# Patient Record
Sex: Female | Born: 1960 | Race: White | Hispanic: No | Smoking: Never smoker
Health system: Southern US, Community
[De-identification: ages and names within clinical notes are randomized; demographics above are authoritative.]

## PROBLEM LIST (undated history)

## (undated) DIAGNOSIS — G43909 Migraine, unspecified, not intractable, without status migrainosus: Secondary | ICD-10-CM

## (undated) DIAGNOSIS — Z8639 Personal history of other endocrine, nutritional and metabolic disease: Secondary | ICD-10-CM

## (undated) DIAGNOSIS — K8020 Calculus of gallbladder without cholecystitis without obstruction: Secondary | ICD-10-CM

## (undated) DIAGNOSIS — F32A Depression, unspecified: Secondary | ICD-10-CM

## (undated) DIAGNOSIS — N63 Unspecified lump in unspecified breast: Secondary | ICD-10-CM

## (undated) DIAGNOSIS — Z87442 Personal history of urinary calculi: Secondary | ICD-10-CM

## (undated) DIAGNOSIS — G4730 Sleep apnea, unspecified: Secondary | ICD-10-CM

## (undated) DIAGNOSIS — E079 Disorder of thyroid, unspecified: Secondary | ICD-10-CM

## (undated) DIAGNOSIS — E039 Hypothyroidism, unspecified: Secondary | ICD-10-CM

## (undated) DIAGNOSIS — I10 Essential (primary) hypertension: Secondary | ICD-10-CM

## (undated) DIAGNOSIS — F329 Major depressive disorder, single episode, unspecified: Secondary | ICD-10-CM

## (undated) DIAGNOSIS — E119 Type 2 diabetes mellitus without complications: Secondary | ICD-10-CM

## (undated) DIAGNOSIS — T7840XA Allergy, unspecified, initial encounter: Secondary | ICD-10-CM

## (undated) HISTORY — DX: Sleep apnea, unspecified: G47.30

## (undated) HISTORY — DX: Depression, unspecified: F32.A

## (undated) HISTORY — DX: Major depressive disorder, single episode, unspecified: F32.9

## (undated) HISTORY — DX: Essential (primary) hypertension: I10

## (undated) HISTORY — DX: Disorder of thyroid, unspecified: E07.9

## (undated) HISTORY — DX: Personal history of other endocrine, nutritional and metabolic disease: Z86.39

## (undated) HISTORY — DX: Calculus of gallbladder without cholecystitis without obstruction: K80.20

## (undated) HISTORY — DX: Unspecified lump in unspecified breast: N63.0

## (undated) HISTORY — DX: Allergy, unspecified, initial encounter: T78.40XA

## (undated) HISTORY — DX: Migraine, unspecified, not intractable, without status migrainosus: G43.909

---

## 1999-03-31 ENCOUNTER — Other Ambulatory Visit: Admission: RE | Admit: 1999-03-31 | Discharge: 1999-03-31 | Payer: Self-pay | Admitting: Obstetrics and Gynecology

## 2000-03-17 ENCOUNTER — Other Ambulatory Visit: Admission: RE | Admit: 2000-03-17 | Discharge: 2000-03-17 | Payer: Self-pay | Admitting: Obstetrics and Gynecology

## 2001-03-19 ENCOUNTER — Other Ambulatory Visit: Admission: RE | Admit: 2001-03-19 | Discharge: 2001-03-19 | Payer: Self-pay | Admitting: Obstetrics and Gynecology

## 2001-06-26 ENCOUNTER — Encounter: Payer: Self-pay | Admitting: Obstetrics and Gynecology

## 2001-06-26 ENCOUNTER — Ambulatory Visit (HOSPITAL_COMMUNITY): Admission: RE | Admit: 2001-06-26 | Discharge: 2001-06-26 | Payer: Self-pay | Admitting: Otolaryngology

## 2002-04-02 ENCOUNTER — Other Ambulatory Visit: Admission: RE | Admit: 2002-04-02 | Discharge: 2002-04-02 | Payer: Self-pay | Admitting: Obstetrics and Gynecology

## 2002-06-28 ENCOUNTER — Ambulatory Visit (HOSPITAL_COMMUNITY): Admission: RE | Admit: 2002-06-28 | Discharge: 2002-06-28 | Payer: Self-pay | Admitting: Family Medicine

## 2002-06-28 ENCOUNTER — Encounter: Payer: Self-pay | Admitting: Family Medicine

## 2002-07-02 ENCOUNTER — Encounter: Payer: Self-pay | Admitting: Obstetrics and Gynecology

## 2002-07-02 ENCOUNTER — Ambulatory Visit (HOSPITAL_COMMUNITY): Admission: RE | Admit: 2002-07-02 | Discharge: 2002-07-02 | Payer: Self-pay | Admitting: Obstetrics and Gynecology

## 2003-06-16 ENCOUNTER — Other Ambulatory Visit: Admission: RE | Admit: 2003-06-16 | Discharge: 2003-06-16 | Payer: Self-pay | Admitting: Obstetrics and Gynecology

## 2003-07-31 ENCOUNTER — Ambulatory Visit (HOSPITAL_COMMUNITY): Admission: RE | Admit: 2003-07-31 | Discharge: 2003-07-31 | Payer: Self-pay | Admitting: Obstetrics and Gynecology

## 2004-08-17 ENCOUNTER — Encounter: Admission: RE | Admit: 2004-08-17 | Discharge: 2004-08-17 | Payer: Self-pay | Admitting: Internal Medicine

## 2004-08-23 ENCOUNTER — Other Ambulatory Visit: Admission: RE | Admit: 2004-08-23 | Discharge: 2004-08-23 | Payer: Self-pay | Admitting: Obstetrics and Gynecology

## 2005-05-27 ENCOUNTER — Ambulatory Visit (HOSPITAL_COMMUNITY): Admission: RE | Admit: 2005-05-27 | Discharge: 2005-05-27 | Payer: Self-pay | Admitting: Family Medicine

## 2005-09-01 ENCOUNTER — Other Ambulatory Visit: Admission: RE | Admit: 2005-09-01 | Discharge: 2005-09-01 | Payer: Self-pay | Admitting: Obstetrics & Gynecology

## 2005-09-01 ENCOUNTER — Encounter: Admission: RE | Admit: 2005-09-01 | Discharge: 2005-09-01 | Payer: Self-pay | Admitting: Obstetrics and Gynecology

## 2005-09-06 ENCOUNTER — Encounter: Admission: RE | Admit: 2005-09-06 | Discharge: 2005-09-06 | Payer: Self-pay | Admitting: Obstetrics & Gynecology

## 2005-11-02 HISTORY — PX: VAGINAL HYSTERECTOMY: SUR661

## 2005-11-29 ENCOUNTER — Ambulatory Visit (HOSPITAL_COMMUNITY): Admission: RE | Admit: 2005-11-29 | Discharge: 2005-11-30 | Payer: Self-pay | Admitting: Obstetrics & Gynecology

## 2005-11-29 ENCOUNTER — Encounter (INDEPENDENT_AMBULATORY_CARE_PROVIDER_SITE_OTHER): Payer: Self-pay | Admitting: Specialist

## 2006-01-21 ENCOUNTER — Emergency Department (HOSPITAL_COMMUNITY): Admission: EM | Admit: 2006-01-21 | Discharge: 2006-01-21 | Payer: Self-pay | Admitting: Emergency Medicine

## 2006-03-29 ENCOUNTER — Emergency Department (HOSPITAL_COMMUNITY): Admission: EM | Admit: 2006-03-29 | Discharge: 2006-03-29 | Payer: Self-pay | Admitting: Family Medicine

## 2006-09-05 ENCOUNTER — Encounter: Admission: RE | Admit: 2006-09-05 | Discharge: 2006-09-05 | Payer: Self-pay | Admitting: Obstetrics & Gynecology

## 2007-09-06 ENCOUNTER — Encounter: Admission: RE | Admit: 2007-09-06 | Discharge: 2007-09-06 | Payer: Self-pay | Admitting: Obstetrics & Gynecology

## 2007-09-14 ENCOUNTER — Encounter: Admission: RE | Admit: 2007-09-14 | Discharge: 2007-09-14 | Payer: Self-pay | Admitting: Obstetrics & Gynecology

## 2007-12-14 ENCOUNTER — Other Ambulatory Visit: Admission: RE | Admit: 2007-12-14 | Discharge: 2007-12-14 | Payer: Self-pay | Admitting: Obstetrics and Gynecology

## 2008-03-17 ENCOUNTER — Encounter: Admission: RE | Admit: 2008-03-17 | Discharge: 2008-03-17 | Payer: Self-pay | Admitting: Obstetrics & Gynecology

## 2008-09-22 ENCOUNTER — Encounter: Admission: RE | Admit: 2008-09-22 | Discharge: 2008-09-22 | Payer: Self-pay | Admitting: Obstetrics & Gynecology

## 2008-11-10 ENCOUNTER — Ambulatory Visit: Payer: Self-pay | Admitting: Cardiology

## 2008-11-10 ENCOUNTER — Observation Stay (HOSPITAL_COMMUNITY): Admission: EM | Admit: 2008-11-10 | Discharge: 2008-11-11 | Payer: Self-pay | Admitting: Emergency Medicine

## 2009-09-29 ENCOUNTER — Encounter: Admission: RE | Admit: 2009-09-29 | Discharge: 2009-09-29 | Payer: Self-pay | Admitting: Obstetrics & Gynecology

## 2010-07-11 LAB — CBC
HCT: 38.2 % (ref 36.0–46.0)
Hemoglobin: 13.3 g/dL (ref 12.0–15.0)
MCHC: 34.7 g/dL (ref 30.0–36.0)
MCV: 95.7 fL (ref 78.0–100.0)
Platelets: 164 10*3/uL (ref 150–400)
RBC: 3.99 MIL/uL (ref 3.87–5.11)
RDW: 13.4 % (ref 11.5–15.5)
WBC: 9.3 10*3/uL (ref 4.0–10.5)

## 2010-07-11 LAB — HEMOGLOBIN A1C
Hgb A1c MFr Bld: 5.9 % (ref 4.6–6.1)
Mean Plasma Glucose: 123 mg/dL

## 2010-07-11 LAB — BASIC METABOLIC PANEL
BUN: 11 mg/dL (ref 6–23)
BUN: 18 mg/dL (ref 6–23)
CO2: 28 mEq/L (ref 19–32)
CO2: 30 mEq/L (ref 19–32)
Calcium: 8.9 mg/dL (ref 8.4–10.5)
Calcium: 9 mg/dL (ref 8.4–10.5)
Chloride: 106 mEq/L (ref 96–112)
Chloride: 109 mEq/L (ref 96–112)
Creatinine, Ser: 0.57 mg/dL (ref 0.4–1.2)
Creatinine, Ser: 0.61 mg/dL (ref 0.4–1.2)
GFR calc Af Amer: >60 mL/min (ref >60)
GFR calc Af Amer: >60 mL/min (ref >60)
GFR calc non Af Amer: >60 mL/min (ref >60)
GFR calc non Af Amer: >60 mL/min (ref >60)
Glucose, Bld: 106 mg/dL — ABNORMAL HIGH (ref 70–99)
Glucose, Bld: 119 mg/dL — ABNORMAL HIGH (ref 70–99)
Potassium: 3.6 mEq/L (ref 3.5–5.1)
Potassium: 4.1 mEq/L (ref 3.5–5.1)
Sodium: 137 mEq/L (ref 135–145)
Sodium: 141 mEq/L (ref 135–145)

## 2010-07-11 LAB — LIPID PANEL
Cholesterol: 137 mg/dL (ref 0–200)
HDL: 35 mg/dL — ABNORMAL LOW (ref >39)
LDL Cholesterol: 68 mg/dL (ref 0–99)
Total CHOL/HDL Ratio: 3.9 RATIO
Triglycerides: 168 mg/dL — ABNORMAL HIGH (ref <150)
VLDL: 34 mg/dL (ref 0–40)

## 2010-07-11 LAB — URINALYSIS, ROUTINE W REFLEX MICROSCOPIC
Bilirubin Urine: NEGATIVE
Glucose, UA: NEGATIVE mg/dL
Hgb urine dipstick: NEGATIVE
Ketones, ur: NEGATIVE mg/dL
Nitrite: NEGATIVE
Protein, ur: NEGATIVE mg/dL
Specific Gravity, Urine: 1.01 (ref 1.005–1.030)
Urobilinogen, UA: 0.2 mg/dL (ref 0.0–1.0)
pH: 5.5 (ref 5.0–8.0)

## 2010-07-11 LAB — POCT CARDIAC MARKERS
CKMB, poc: <1 ng/mL — ABNORMAL LOW (ref 1.0–8.0)
CKMB, poc: <1 ng/mL — ABNORMAL LOW (ref 1.0–8.0)
Myoglobin, poc: 25.7 ng/mL (ref 12–200)
Myoglobin, poc: 31.2 ng/mL (ref 12–200)
Troponin i, poc: <0.05 ng/mL (ref 0.00–0.09)
Troponin i, poc: <0.05 ng/mL (ref 0.00–0.09)

## 2010-07-11 LAB — D-DIMER, QUANTITATIVE (NOT AT ARMC): D-Dimer, Quant: 0.29 ug/mL-FEU (ref 0.00–0.48)

## 2010-07-11 LAB — DIFFERENTIAL
Basophils Absolute: 0 10*3/uL (ref 0.0–0.1)
Basophils Relative: 0 % (ref 0–1)
Eosinophils Absolute: 0.1 10*3/uL (ref 0.0–0.7)
Eosinophils Relative: 1 % (ref 0–5)
Lymphocytes Relative: 22 % (ref 12–46)
Lymphs Abs: 2.1 10*3/uL (ref 0.7–4.0)
Monocytes Absolute: 0.9 10*3/uL (ref 0.1–1.0)
Monocytes Relative: 9 % (ref 3–12)
Neutro Abs: 6.2 10*3/uL (ref 1.7–7.7)
Neutrophils Relative %: 67 % (ref 43–77)

## 2010-07-11 LAB — GLUCOSE, CAPILLARY
Glucose-Capillary: 106 mg/dL — ABNORMAL HIGH (ref 70–99)
Glucose-Capillary: 120 mg/dL — ABNORMAL HIGH (ref 70–99)
Glucose-Capillary: 90 mg/dL (ref 70–99)
Glucose-Capillary: 99 mg/dL (ref 70–99)

## 2010-07-11 LAB — CARDIAC PANEL(CRET KIN+CKTOT+MB+TROPI)
CK, MB: 0.9 ng/mL (ref 0.3–4.0)
CK, MB: 0.9 ng/mL (ref 0.3–4.0)
Relative Index: INVALID (ref 0.0–2.5)
Relative Index: INVALID (ref 0.0–2.5)
Total CK: 24 U/L (ref 7–177)
Total CK: 26 U/L (ref 7–177)
Troponin I: 0.01 ng/mL (ref 0.00–0.06)
Troponin I: 0.01 ng/mL (ref 0.00–0.06)

## 2010-07-11 LAB — URINE CULTURE: Colony Count: 75000

## 2010-07-11 LAB — TSH: TSH: 4.808 u[IU]/mL — ABNORMAL HIGH (ref 0.350–4.500)

## 2010-08-17 NOTE — H&P (Signed)
NAME:  Melissa Pitts, Melissa Pitts                 ACCOUNT NO.:  [id number]   MEDICAL RECORD NO.:  [id number]          PATIENT TYPE:  EMS   LOCATION:  ED                            FACILITY:  [facility]   PHYSICIAN:  Melissa L. Ladona Ridgel, MD  DATE OF BIRTH:  Mar 04, 1961   DATE OF ADMISSION:  11/10/2008  DATE OF DISCHARGE:  LH                              HISTORY & PHYSICAL   CHIEF COMPLAINT:  I have this thing in my chest, it feels like  heartburn.Marland Kitchen   PRIMARY CARE PHYSICIAN:  Dr. Phillips Odor.   HISTORY OF PRESENT ILLNESS:  The patient is a 50 year old white female  with a past medical history for hypertension and hyperglycemia was  recently started on metformin.  The patient states that she awoke this  morning and had a sensation of indigestion that was rising up into her  chest and jaws.  She states she felt like she could not get a deep  breath.  She states that over the last week that she has had multiple  episodes of this heartburn type feeling with a sense of pressure and  tightness and usually located in the center of her chest, but today was  in her throat.  The patient states that she still feels like she cannot  take a full breath but she does not have the sensation of indigestion,  but still has a sense that something happened in the center of her  chest.  The patient found no change in her pain with a GI cocktail given  in the emergency room for the sublingual nitroglycerin.   REVIEW OF SYSTEMS:  Her weight has been about the same.  She is obese  for her size.  She has had no fever or chills.  EYES: No blurred vision,  double vision.  EARS, NOSE AND THROAT:  No tinnitus, dysphagia or  discharge.  CARDIOVASCULAR:  Chest pain as described above.  No  palpitations.  RESPIRATORY:  No shortness of breath more than what she  calls being out of shape, no cough.  GI:  No nausea, vomiting, diarrhea.  GU:  No hesitancy, frequency or dysuria.  NEUROLOGIC:  No headache or  seizure disorder.  MUSCULOSKELETAL:   No deformity or pain.  INTEGUMENTARY:  No new rashes.  HEMATOLOGIC:  No bleeding disorders.  PSYCHIATRIC:  She does have depression.  ENDOCRINOLOGIC:  She does have  glucose intolerance but does not call it diabetes.  She has been told it  is pre-diabetes.Marland Kitchen   PAST MEDICAL HISTORY:  1. Hypertension on medications.  2. Hyperglycemia as described.  3. Depression.  4. Hypercholesterolemia for which she states she does not take a      medication but has been offered one.   PAST SURGICAL HISTORY:  Hysterectomy.   SOCIAL HISTORY:  She is married.  She has 1 son.  Her husband's name is  Greggory Stallion.  His number is [phone].  She works for [employer].  She  does not smoke.  She does not drink.   FAMILY HISTORY:  Mom has hypertension.  Dad has diabetes, myocardial  infarction x2, and CABG.   ALLERGIES:  1. PENICILLIN.  2. SULFA.   MEDICATIONS:  1. Lisinopril 10 mg once daily.  2. Metformin 250 mg twice daily.  3. Lexapro 30 mg once daily.   PHYSICAL EXAMINATION:  VITAL SIGNS:  Temperature is 99.1, blood pressure  180/64, pulse 69, respirations 12, saturation 100%.  GENERAL:  This is a moderately obese white female in no acute distress.  HEENT:  She is normocephalic, atraumatic.  Pupils equal, round and  reactive to light.  Extraocular muscles are intact.  She has anicteric  sclerae.  Examination of the ears reveal tympanic membranes are intact  bilaterally.  No external lesions or discharge.  Examination of the nose  reveals septum midline without any discharge or lesions.  Examination of  the mouth reveals no oral lesions or lip lesions.  She has excellent  dentition.  NECK:  Neck is supple.  There is no JVD, no lymph nodes, no carotid  bruits.  CHEST:  Chest is clear to auscultation.  There is no rhonchi, rales or  wheezes.  CARDIOVASCULAR:  Regular rate and rhythm.  Positive S1 and S2.  No S3,  S4.  No murmurs, rubs or gallops.  ABDOMEN:  Abdomen is soft, nontender,  nondistended with positive bowel  sounds.  No hepatosplenomegaly.  No guarding, no rebound.  EXTREMITIES:  No clubbing, cyanosis or edema.  NEUROLOGICALLY:  She is awake, alert, oriented x3.  Cranial nerves II-  XII are intact.  Power is 5/5.  DTRs are 2. Plantars are down-going.  PSYCHIATRIC:  Affect is appropriate.  Recent and remote memory intact.  Judgment and insight are intact.   LABORATORY:  Sodium is 137, potassium 4.1, chloride 106, CO2 28, BUN is  18, creatinine 0.61 and glucose of 119.  White count is 9.3, hemoglobin  13.3, hematocrit 38.2 platelets of 164.  EKG shows 84 beats per minute  normal sinus rhythm with no ST-T wave changes.  Her CK-MB was less than  1, troponin less than 0.5 and myoglobin was normal.  Her D-dimer was  0.29.  Chest x-ray showed no acute pulmonary or cardiac disorder.   ASSESSMENT/PLAN:  This is a 50 year old white female with atypical chest  pain and family history of myocardial infarction.  She also has  hypertension and hyperglycemia.  The patient will be admitted for  observation to telemetry and further rule out for myocardial infarction.  1. Atypical chest pain.  Aspirin has been given.  We will start her on      Lovenox 1 mg/kg until she rules out.  No beta blocker will be added      at this time.  I will continue her ACE inhibitor.  We will check a      cholesterol and hemoglobin A1c.  2. Hypertension.  Continue her ACE.  3. Glucose intolerance/hyperglycemia.  We will continue her metformin      and check hemoglobin A1c.  4. Hyperlipidemia.  We will check levels and counsel her with regard      to medication versus dietary changes.  5. Low grade temperature.  We will check a urinalysis, culture and      sensitivity.   Total time on this case was approximately 50 minutes.      Melissa L. Ladona Ridgel, MD  Electronically Signed    MLT/MEDQ  D:  11/10/2008  T:  11/10/2008  Job:  [id number]

## 2010-08-17 NOTE — Consult Note (Signed)
Melissa Pitts, Melissa Pitts                 ACCOUNT NO.:  [id number]   MEDICAL RECORD NO.:  [id number]          PATIENT TYPE:  OBV   LOCATION:  A308                          FACILITY:  [facility]   PHYSICIAN:  Gerrit Friends. Dietrich Pates, MD, FACCDATE OF BIRTH:  1960-11-22   DATE OF CONSULTATION:  11/11/2008  DATE OF DISCHARGE:  11/11/2008                                 CONSULTATION   PRIMARY CARDIOLOGIST:  New.   PRIMARY CARE PHYSICIAN:  Dr. Phillips Odor at [facility].   REASON FOR CONSULTATION:  Chest pain.   HISTORY OF PRESENT ILLNESS:  A 50 year old obese Caucasian female with  no prior history of coronary artery disease was admitted to the  emergency room with complaints of hypertension and heartburn.  The  patient has a past medical history of hypertension, hyperglycemia.  The  patient states that the indigestion started in her chest approximately 1  week coming and going, which was relieved by over-the-counter Pepcid.  The day of admission in the morning, the patient had woke up with more  severe heartburn and she was unable to take a deep breath.  She called  her primary care physician and went to his office and she was unable to  be seen, so she was recommended come to the emergency room.  On arrival  in the emergency room, her blood pressure was 134/78 and her heart rate  was 62.  The patient was given sublingual nitroglycerin and  nitroglycerin patch was placed.  She had a GI cocktail and aspirin and  she has had no further complaints of chest discomfort.  The patient  became concerned that she has a family history of CAD and wanted to be  checked out further.  Her father actually is the patient of ours of Dr.  Royal Hawthorn and followed by Dr. Dietrich Pates with a history of diabetes,  MI x2 and a history of CABG.   REVIEW OF SYSTEMS:  Positive for heartburn and unable to take deep  breath.  Negative for nausea, vomiting, diarrhea, diaphoresis or  dizziness.  All other systems have been  reviewed and found to be  negative.   PAST MEDICAL HISTORY:  Hypertension x2 years, hyperglycemia x1 year,  depression greater than 5 years, hypercholesterolemia greater than 1  year, sleep apnea greater than 2 years.  She does use CPAP.   SOCIAL HISTORY:  She lives in [place] with her husband.  She works at  [employer].  She does not smoke, drink alcohol or use  illicit drugs.   FAMILY HISTORY:  Mother with hypertension.  Father with diabetes, MI x2  and a CABG.  She has a brother, but she does not know his medical  history.   PAST SURGICAL HISTORY:  Hysterectomy.   CURRENT MEDICATIONS AT HOME:  1. Metformin 500 mg one-half tablet b.i.d.  2. Lisinopril 10 mg daily.  3. Lexapro 20 mg 1-1/2 tablets daily.   ALLERGIES:  To PENICILLIN and SULFA.   HOSPITAL MEDICATIONS:  1. Metformin 250 b.i.d.  2. Lisinopril 10 mg daily.  3. Lexapro 30 mg daily.  4.  Low-molecular-weight heparin 90 mg q.12 h. subcutaneously.  5. Aspirin 325 daily.  6. Protonix 40 mg daily.   CURRENT LABORATORY FINDINGS:  Sodium 141, potassium 3.6, chloride 109,  CO2 30, BUN 11, creatinine 0.57, glucose 106, hemoglobin 13.3,  hematocrit 38.2, white blood cells 9.3, platelets 164, D-dimer 0.29,  troponin less than 0.05 and 0.01.  Chest x-ray revealing no acute  significant findings.  EKG revealing normal sinus rhythm, rate of 84  beats per minute.   Cardiovascular risk factors; hypertension, hypercholesterolemia.   FAMILY HISTORY:  Diabetes and obesity.   PHYSICAL EXAMINATION:  VITAL SIGNS:  Blood pressure 143/75, pulse 84,  respirations 20, temperature 97.5, O2 sat 96% on room air, and weight  101.1 kg.  GENERAL:  She is awake, alert and oriented in no acute distress.  HEENT:  Head is normocephalic and atraumatic.  Eyes, PERRLA.  Mucous  membranes of mouth pink and moist.  Trachea is midline.  NECK:  Supple.  No JVD.  No carotid bruits.  CARDIOVASCULAR:  Regular rate and rhythm without  murmurs, rubs or  gallops.  LUNGS:  Clear to auscultation without wheezes, rales or rhonchi.  ABDOMEN:  Soft, nontender with 2+ bowel sounds.  EXTREMITIES:  Without clubbing, cyanosis or edema.  NEURO:  Cranial nerves II-XII are grossly intact.   IMPRESSION:  1. Atypical chest pain.  Negative cardiac enzymes, negative EKG.      Recommend outpatient stress echocardiogram.  2. Hyperlipidemia.  Check lipids and LFTs if not done recently in the      office, statin if necessary.  3. Hypertension, currently well controlled on current medication      regimen.  4. Hyperglycemia, to be followed by primary care physician.   PLAN:  This is a 50 year old obese Caucasian female who is in with  heartburn and trouble taking deep breaths, negative cardiac enzymes and  EKG with multiple cardiovascular risk factors.  We would recommend  discharge with outpatient stress echocardiogram in our office and follow  up as an outpatient with Dr. Dietrich Pates.  The patient should be sent home  on PPI and recommend GI consultation should symptoms reoccur, as well  more recommendations per Dr. Dietrich Pates on office visit followup.   On behalf of the physicians and providers of [facility], we would like to thank Dr. Phillips Odor and [facility] for allowing [place] to participate in care of this patient.      Bettey Mare. Lyman Bishop, NP      Gerrit Friends. Dietrich Pates, MD, [facility]  Electronically Signed    KML/MEDQ  D:  11/11/2008  T:  11/11/2008  Job:  [id number]   cc:   Dr. Phillips Odor   [facility]

## 2010-08-17 NOTE — Discharge Summary (Signed)
NAME:  Melissa Pitts, Melissa Pitts                 ACCOUNT NO.:  [id number]   MEDICAL RECORD NO.:  [id number]          PATIENT TYPE:  OBV   LOCATION:  A308                          FACILITY:  [facility]   PHYSICIAN:  Melissa L. Ladona Ridgel, MD  DATE OF BIRTH:  01/04/1961   DATE OF ADMISSION:  11/10/2008  DATE OF DISCHARGE:  08/10/2010LH                               DISCHARGE SUMMARY   DISCHARGE DIAGNOSES:  1. Atypical chest pain and thought to be gastroenterologic in origin      and not cardiac.  The patient is referred for a gastrointestinal      workup as an outpatient and will be placed on omeprazole 40 mg once      daily until evaluation is completed.  2. Hypertension.  Will continue her lisinopril 10 mg daily.  3. Depression.  Will continue her Lexapro 30 mg daily.  4. Hyperglycemia.  The patient states that she is pre diabetic and      takes metformin 25 mg twice daily.  Currently her hemoglobin A1c is      pending.  I will ask Dr. Phillips Odor to please follow up on this for      the patient during our next meeting.   1. Hyperlipidemia.  The patient is currently on no statin.  She had      declined therapy during her last visit but we do have lipids panel      pending.  They are not available at the time of discharge so I will      ask Dr. Phillips Odor to please follow up on this at the time of his      evaluation.   MEDICATIONS AT THE TIME OF DISCHARGE:  1. Metformin 250 mg twice daily.  2. Lisinopril 10 mg once daily.  3. Lexapro 30 mg daily.  4. Omeprazole 40 mg once daily which is a new medication for her.   CONSULTATIONS:  With Dr. Dietrich Pates from [facility] at the  patient's request.   HOSPITAL COURSE:  The patient is a pleasant 50 year old white female who  presented to the hospital yesterday with a chief complaint of a  persistently recurrent sensation of burning, radiating into her jaw, and  a sense of pressure or really difficult to describe discomfort in the  center of her chest.  The  patient was admitted to a telemetry floor.  She was started on a proton pump inhibitor and serial cardiac markers  were obtained all of which have been negative.  Her EKG showed normal  sinus rhythm with no ST or T-wave changes.  The patient had no further  discomfort over the course of the night.  She was treated with aspirin  325 mg daily and low molecular weight heparin at 1 mg/kg.  On the day of  discharge the patient had no further discomfort.  Over the course of the  evening she was cleared from cardiology standpoint for outpatient  evaluation with her primary care physician and GI.  No further cardiac  workup was recommended.  I will ask Dr. Phillips Odor to please follow up  on  the patient's lipid panel and hemoglobin A1c as it is outstanding at the  time of this dictation.  On the day of discharge the patient offers no  complaints.  Her temperature is 98.6, blood pressure 125/75, pulse 79,  respirations 18, saturation 95%.  Generally, this is a moderately obese  white female in no acute distress.  She is normocephalic, atraumatic.  Pupils equal, round, and reactive to light.  Extraocular muscles are  intact.  Membranes are moist.  Neck is supple.  There is no JVD, no  lymph nodes, no carotid bruits.  Chest is clear to auscultation.  There  are no rhonchi, rales or wheezes.  Cardiovascular is regular rate and  rhythm, positive S1, S2, no S3, S4, no murmurs, rubs or gallops.  Abdomen is obese, soft, nontender, nondistended with positive bowel  sounds.  There is no hepatosplenomegaly, no guarding or rebound.  Extremities show no clubbing, cyanosis or edema.  Neurologically, she is  awake, alert, oriented.  Cranial nerves II-XII are intact.  Power is  5/5.  DTRs 2+ and toes are downgoing.   PERTINENT LABORATORY VALUES:  Again, her cardiac markers are completely  negative.  A hemoglobin A1c, lipid panel and thyroid studies are pending  at the time of discharge.  Urinalysis was negative.   Her most recent  sodium was 137, potassium 4.1, chloride 106, CO2 28, BUN 18, creatinine  0.6, and glucose of 119.  Her D-dimer was negative at 0.29.  Her white  cell count was 9.3 with hemoglobin of 13.3, hematocrit 38.2 and  platelets of 164.   As stated her EKG showed no as acute ST or T-wave changes.  She was  normal sinus rhythm.  Chest x-ray showed no obvious infiltrate.   At this time, the patient is deemed stable for discharge to home to  follow up with her primary care physician with regard to a  gastroenterology consultation for possible GERD causing her chest  discomfort.   CONDITION:  Stable.   DIET:  Low-fat, low cholesterol.   The patient has been advised to lose some weight and disposition is to  home.   Total time of discharge was less than 30 minutes.      Melissa L. Ladona Ridgel, MD  Electronically Signed     MLT/MEDQ  D:  11/11/2008  T:  11/11/2008  Job:  [id number]   cc:   Dr. Phillips Odor

## 2010-08-20 NOTE — Discharge Summary (Signed)
NAME:  Melissa Pitts, FOUCHE                 ACCOUNT NO.:  [id number]   MEDICAL RECORD NO.:  [id number]          PATIENT TYPE:  OIB   LOCATION:  9320                          FACILITY:  [facility]   PHYSICIAN:  M. Leda Quail, MD  DATE OF BIRTH:  1961/01/12   DATE OF ADMISSION:  11/29/2005  DATE OF DISCHARGE:                                 DISCHARGE SUMMARY   ADMISSION DIAGNOSES:  19. A 50 year old gravida 2, para 1 married white female with menorrhagia.  2. Dysmenorrhea.  3. Pelvic pain.  4. Uterus on ultrasound showing uterine fibroids.  5. Depression.   DISCHARGE DIAGNOSES:  69. A 50 year old gravida 2, para 1 married white female with menorrhagia.  2. Dysmenorrhea.  3. Pelvic pain.  4. Uterus on ultrasound showing uterine fibroids.  5. Depression.   PROCEDURES:  TVH.   HISTORY OF PRESENT ILLNESS:  A written H&P is in the chart, but in brief  Mrs. Melissa Pitts is a 50 year old G2, P1 married white female who has history of  menorrhagia that has been controlled with oral contraceptives.  Bleeding has  gotten worse and ultrasound recently showed a 9 x 5 x 7 cm uterus with at  least three fibroids, with the largest being over 3 cm.  She has also had  worsening of pelvic pain that has been present for some time and worsening  dysmenorrhea.  She at this time desires definitive treatment.  The remainder  of her H&P is written in the chart.   HOSPITAL COURSE:  The patient was admitted through same-day surgery.  She  was taken to the OR after blood work and anesthesia evaluation was  performed.  She underwent a TVH without difficulty.  The patient had 150 mL  of blood loss, made appropriate urine during the procedure, and had no  complications.  From the OR she was taken to recovery room and after an  appropriate time there was taken to the third floor for postoperative care.  She did well.  On the evening of postoperative day #1 she was having some  mild nausea but good pain control.  Vital signs  were stable, she was  afebrile.  She had already voided twice with volumes greater than 300 mL.  She felt like she was emptying her bladder well.  Her exam was benign.  She  was having some vaginal spotting.  She had good bowel sounds at this point.  A heating pad was ordered for the patient to help with the cramping that she  was having, but otherwise she was having excellent pain control with her  Dilaudid PCA and Toradol.  The patient was also able to tolerate a regular  diet in the evening of postoperative day #0.  The patient had some trouble  sleeping during the night and was given an Ambien, but in the morning of  postoperative day #1 she had already passed flatus, her nausea was  completely gone, and her pain was under excellent control.  She was feeling  tired and sleepy due to the Ambien.  Vital signs were stable, T-max 98.8,  pulse was between 70 and 90, blood pressure was 94 to 122 over 54 to 69.  She had had over 1200 mL of urine output and her postoperative hemoglobin  was 10.4.  This was a drop from 13.6 but appropriate due to her blood loss  and the hydration she received during her surgery.  Her exam was benign, she  had good bowel sounds.  Abdomen was soft and nontender, had some mild  distention, and perineum had spotting only.  She had worn her SCDs all night  and had no clubbing, cyanosis, or edema.   At this point, discharge was felt appropriate.  She was voiding, tolerating  a regular diet, tolerating pain medicine, and had good pain control.  The  patient will be discharged to home with her husband.  She has a followup  appointment with me on December 06, 2005, at 2:15 p.m.  She is given  discharge instructions in the written and verbal form and she already has  her prescriptions for pain medicine which include Percocet and Motrin and  she will resume her Lexapro.      Lum Keas, MD  Electronically Signed     MSM/MEDQ  D:  11/30/2005  T:  11/30/2005   Job:  [id number]

## 2010-08-20 NOTE — Op Note (Signed)
NAME:  Melissa Pitts, Melissa Pitts                 ACCOUNT NO.:  [id number]   MEDICAL RECORD NO.:  [id number]          PATIENT TYPE:  AMB   LOCATION:  SDC                           FACILITY:  [facility]   PHYSICIAN:  M. Leda Quail, MD  DATE OF BIRTH:  March 29, 1961   DATE OF PROCEDURE:  11/29/2005  DATE OF DISCHARGE:                                 OPERATIVE REPORT   PREOPERATIVE DIAGNOSIS:  44. 50 year old G2, P1, married white female with menorrhagia.  2. Dysmenorrhea.  3. Pelvic pain.  4. Fibroid uterus.  5. History of vaginal delivery x1.   POSTOPERATIVE DIAGNOSIS:  47. 50 year old G2, P91, married white female with menorrhagia.  2. Dysmenorrhea.  3. Pelvic pain.  4. Fibroid uterus.  5. History of vaginal delivery x1.   PROCEDURE:  Transvaginal hysterectomy.   SURGEON:  M. Leda Quail, M.D.   ASSISTANT:  Edwena Felty. Romine, M.D.   ANESTHESIA:  General endotracheal anesthesia.   SPECIMENS:  Uterus, 175 grams, and cervix sent to pathology.   ESTIMATED BLOOD LOSS:  150 mL.   URINE OUTPUT:  200 mL of clear urine drained with a red rubber catheter at  the beginning and the end of the procedure.   FLUIDS REPLACED:  2600 mL of LR.   COMPLICATIONS:  None.   INDICATIONS FOR PROCEDURE:  Melissa Pitts is a 50 year old G2, P9, married white  female who has a history of menorrhagia that has been controlled with Lo-  Estrin in the past.  Her cycles have begun to get heavier and heavier and  now, cycles last 6-7 days, heavy with clots.  She has had some right sided  pelvic pain, as well.  She did undergo evaluation with an ultrasound which  did show several fibroids in her uterus, in particular, one approximately 3  cm in size, all to the right side of the uterus, about in the location of  her pelvic pain.  We have discussed continued conservative management,  changing of birth control pills, using oral Progestin, proceed with  ablation, but the patient has decided to proceed with definitive  management  and opted for hysterectomy.   DESCRIPTION OF PROCEDURE:  The patient is taken to the operating room and  general endotracheal anesthesia is administered by the anesthesia staff  without difficulty.  A running IV is in place.  Informed consent is present  on the chart.  The legs were positioned in [place] stirrups.  The abdomen,  perineum, inner thighs, and vagina were prepped and draped in normal sterile  fashion.  The legs are positioned in the dorsal lithotomy position.  A Foley  catheter is used to drain the bladder of all urine.   A heavy weighted speculum was placed in the vagina.  The cervix was  instilled with 10 mL of 0.5% Marcaine mixed with 1:100,000 epinephrine.  The  posterior cul-de-sac is entered sharply.  A figure-of-eight suture is placed  on the posterior peritoneum and posterior vaginal mucosa.  The cervix is  then circumferentially incised using a Bovie.  Next, the right and left  uterosacral ligaments were  grasped with Heaney clamps.  The pedicles are  transected and suture ligated with Heaney suture of 0 Vicryl.  Attention was  turned anteriorly.  The pubovesical cervical fascia is dissected both with  sharp and blunt dissection to advance the bladder superiorly on the cervix.  At this point, beginning at the cardinal ligaments, they are transected  bilaterally, this is done in serial fashion.  The pedicles are clamped,  transected, suture ligated with 0 Vicryl.  Further dissection of the  pubovesical cervical fascia is performed as necessary.  Four additional  serial clamps of the cardinal ligaments bilaterally and then the uterine  arteries bilaterally was performed.  These pedicles, again, were transected  and suture ligated with 0 Vicryl.  The pedicle that contained the uterine  arteries bilaterally was doubly suture ligated, both with stitches of 0  Vicryl.   At this point, the reflection of the anterior peritoneum was noted and  incised with  Metzenbaum scissors.  A Deaver retractor was placed through  this incision and the bladder was advanced further out of the surgical  field.  Then, two additional clamps on each side of the uterus above the  level of the uterine artery to incorporate the broad ligament were placed.  These incorporated the anterior peritoneum.  The pedicles were transected  and suture ligated with 0 Vicryl.  Then, the posterior aspect of the uterus  was grasped with a thyroid tenaculum, it was brought through the vaginal  opening, but because of its size due to the fibroid, the fundus of the  uterus needed to be wedged out.  Three pieces of the uterus as well as  several fibroids were removed.  At this point, the uterus could be delivered  through the incision without difficulty.  An open laparotomy tape was placed  to push the bowel up out of the way.  Then, the utero-ovarian pedicles were  transected with Heaney clamps.  The pedicles were transected and the uterus  and cervix were delivered through the incision.  The utero-ovarian pedicles  were then tied first with a free tie of 0 Vicryl and a second stitch tied  with 0 Vicryl.  The ovaries, bilaterally, appeared normal as well as the  fallopian tube.  Once the utero-ovarian pedicles were tied tightly, they  were inspected and no bleeding was noted.  There was a small amount of  bleeding on the patient's left side where the cardinal ligament pedicles  were.  An Allis clamp was placed across the bleeding and a superficial  figure-of-eight suture was placed to insure excellent hemostasis.  The cuff  was irrigated and no significant bleeding was noted except on the posterior  aspect of the cuff.  The utero-ovarian pedicles were inspected, no bleeding  was noted.  These stitches were cut short.  The long heavy weighted speculum  was removed and a short heavy weighted speculum was placed back in the  posterior vagina.  The cuff was then run with a 0 Vicryl.  The  cuff was run starting at the 2 o'clock position incorporating the anterior vaginal mucosa  and anterior peritoneum and using a running interlocking stitch.  The cuff  was run all the way from the 2 o'clock to the 10 o'clock position.  The cuff  was then irrigated again and there was minimal bleeding from the cuff.  The  bilateral pedicles of the cardinal ligament, uterine arteries, and utero-  ovarian were dry.  At this point, the cuff  was closed with four figure-of-  eight sutures of 0 Vicryl.  The vaginal tissue was irrigated, no bleeding  was noted.  A red rubber Foley  catheter was placed in the bladder and clear urine was noted, approximately  150 mL.  Sponge, lap, needle, and instrument counts were correct x2.  The  patient tolerated the procedure well.  She was extubated from anesthesia,  awakened, and taken to the recovery room in stable condition.      Lum Keas, MD  Electronically Signed     MSM/MEDQ  D:  11/29/2005  T:  11/29/2005  Job:  [id number]

## 2010-10-13 ENCOUNTER — Other Ambulatory Visit: Payer: Self-pay | Admitting: Obstetrics & Gynecology

## 2010-10-13 DIAGNOSIS — Z1231 Encounter for screening mammogram for malignant neoplasm of breast: Secondary | ICD-10-CM

## 2010-10-18 ENCOUNTER — Ambulatory Visit: Payer: Self-pay

## 2010-11-08 ENCOUNTER — Ambulatory Visit
Admission: RE | Admit: 2010-11-08 | Discharge: 2010-11-08 | Disposition: A | Discharge status: Home / Self Care | Payer: Federal, State, Local not specified - PPO | Source: Ambulatory Visit | Attending: Obstetrics & Gynecology | Admitting: Obstetrics & Gynecology

## 2010-11-08 DIAGNOSIS — Z1231 Encounter for screening mammogram for malignant neoplasm of breast: Secondary | ICD-10-CM

## 2010-11-09 ENCOUNTER — Other Ambulatory Visit: Payer: Self-pay | Admitting: Obstetrics & Gynecology

## 2010-11-09 DIAGNOSIS — R928 Other abnormal and inconclusive findings on diagnostic imaging of breast: Secondary | ICD-10-CM

## 2010-11-16 ENCOUNTER — Ambulatory Visit
Admission: RE | Admit: 2010-11-16 | Discharge: 2010-11-16 | Disposition: A | Discharge status: Home / Self Care | Payer: Federal, State, Local not specified - PPO | Source: Ambulatory Visit | Attending: Obstetrics & Gynecology | Admitting: Obstetrics & Gynecology

## 2010-11-16 DIAGNOSIS — R928 Other abnormal and inconclusive findings on diagnostic imaging of breast: Secondary | ICD-10-CM

## 2011-03-17 ENCOUNTER — Encounter: Payer: Self-pay | Admitting: Internal Medicine

## 2011-04-06 ENCOUNTER — Encounter: Payer: Self-pay | Admitting: Internal Medicine

## 2011-04-06 ENCOUNTER — Ambulatory Visit (AMBULATORY_SURGERY_CENTER): Payer: Federal, State, Local not specified - PPO | Admitting: *Deleted

## 2011-04-06 VITALS — Ht 63.0 in | Wt 216.1 lb

## 2011-04-06 DIAGNOSIS — Z1211 Encounter for screening for malignant neoplasm of colon: Secondary | ICD-10-CM

## 2011-04-06 MED ORDER — PEG-KCL-NACL-NASULF-NA ASC-C 100 G PO SOLR: 1.0000 | Freq: Once | ORAL | Status: DC

## 2011-04-20 ENCOUNTER — Ambulatory Visit (AMBULATORY_SURGERY_CENTER): Payer: Federal, State, Local not specified - PPO | Admitting: Internal Medicine

## 2011-04-20 ENCOUNTER — Encounter: Payer: Self-pay | Admitting: Internal Medicine

## 2011-04-20 ENCOUNTER — Telehealth: Payer: Self-pay | Admitting: *Deleted

## 2011-04-20 VITALS — BP 134/67 | HR 79 | Temp 98.5°F | Resp 16 | Ht 63.0 in | Wt 216.0 lb

## 2011-04-20 DIAGNOSIS — Z1211 Encounter for screening for malignant neoplasm of colon: Secondary | ICD-10-CM

## 2011-04-20 LAB — GLUCOSE, CAPILLARY
Glucose-Capillary: 95 mg/dL (ref 70–99)
Glucose-Capillary: 102 mg/dL — ABNORMAL HIGH (ref 70–99)

## 2011-04-20 MED ORDER — SODIUM CHLORIDE 0.9 % IV SOLN: 500.0000 mL | INTRAVENOUS | Status: DC

## 2011-04-20 NOTE — Telephone Encounter (Signed)
Spoke with [facility] radiology and scheduled patient for air contrast Barium enema( incomplete colonoscopy today, no biopsy or polyps)on 04/22/11 9:15/9:30 AM. Patient to pickup contrast today.NPO after midnight. Date and instructions given to [facility] in [facility] recovery.

## 2011-04-20 NOTE — Progress Notes (Signed)
Patient did not experience any of the following events: a burn prior to discharge; a fall within the facility; wrong site/side/patient/procedure/implant event; or a hospital transfer or hospital admission upon discharge from the facility. (G8907) Patient did not have preoperative order for IV antibiotic SSI prophylaxis. (G8918)  

## 2011-04-20 NOTE — Patient Instructions (Addendum)
Your appointment at [facility] Radiology Dept. is on Friday January 18th at 0930. Please Arrive at 0915. Please pick up your prep TODAY at [facility] Radiology Dept.   Please DO NOT HAVE ANYTHING BY MOUTH AFTER MIDNIGHT Thursday NIGHT (04/21/2011).

## 2011-04-20 NOTE — Op Note (Signed)
[facility] [address] [place], [place]  [address]  COLONOSCOPY PROCEDURE REPORT  PATIENT:  Melissa Pitts, Melissa Pitts  MR#:  [id number] BIRTHDATE:  Jul 18, 1960, 50 yrs. old  GENDER:  female ENDOSCOPIST:  Hedwig Morton. Juanda Chance, MD REF. BY:  Assunta Found, M.D. PROCEDURE DATE:  04/20/2011 PROCEDURE:  Incomplete colonoscopy ASA CLASS:  Class I INDICATIONS:  colorectal cancer screening, average risk MEDICATIONS:   These medications were titrated to patient response per physician's verbal order, Versed 10 mg, Fentanyl 100 mcg, Benadryl 25 mg  DESCRIPTION OF PROCEDURE:   After the risks and benefits and of the procedure were explained, informed consent was obtained.  No rectal exam performed. The LB CF-H180AL [id number] endoscope was introduced through the anus and advanced to the ascending colon. The quality of the prep was excellent, using MoviPrep.  The instrument was then slowly withdrawn as the colon was fully examined. <<PROCEDUREIMAGES>>  FINDINGS:  incomplete exam. normal colon to mid ascending colon, uable to advance to the cecum due to pt straining, we used adult as well as pediatric scope to try to advance.Pt was pushing against me (see image1, image2, and image3).   Retroflexed views in the rectum revealed not done.    The scope was then withdrawn from the patient and the procedure completed.  COMPLICATIONS:  None ENDOSCOPIC IMPRESSION: 1) Incomplete exam normal colon to ascending colon, unable to reach cecum due to technical difficulties recall colon in 10 years with Propofol RECOMMENDATIONS: BEnema, use Propofol in the future  REPEAT EXAM:  In 10 year(s) for.  ______________________________ Hedwig Morton. Juanda Chance, MD  CC:  n. eSIGNED:   Hedwig Morton. Dublin Grayer at 04/20/2011 03:07 PM  Earleen Reaper, [phone]

## 2011-04-21 ENCOUNTER — Telehealth: Payer: Self-pay

## 2011-04-21 NOTE — Telephone Encounter (Signed)
Follow up Call- Patient questions:  Do you have a fever, pain , or abdominal swelling? no Pain Score  0 *  Have you tolerated food without any problems? yes  Have you been able to return to your normal activities? no Stayed out from work again today because loose stools continue Do you have any questions about your discharge instructions: Diet   no Medications  no Follow up visit  no  Do you have questions or concerns about your Care? no  Actions: * If pain score is 4 or above: No action needed, pain <4.

## 2011-04-22 ENCOUNTER — Ambulatory Visit (HOSPITAL_COMMUNITY)
Admission: RE | Admit: 2011-04-22 | Discharge: 2011-04-22 | Disposition: A | Discharge status: Home / Self Care | Payer: Federal, State, Local not specified - PPO | Source: Ambulatory Visit | Attending: Internal Medicine | Admitting: Internal Medicine

## 2011-04-22 DIAGNOSIS — R112 Nausea with vomiting, unspecified: Secondary | ICD-10-CM | POA: Insufficient documentation

## 2011-04-22 DIAGNOSIS — Z1211 Encounter for screening for malignant neoplasm of colon: Secondary | ICD-10-CM

## 2012-05-01 ENCOUNTER — Encounter (INDEPENDENT_AMBULATORY_CARE_PROVIDER_SITE_OTHER): Payer: Self-pay | Admitting: Surgery

## 2012-05-01 ENCOUNTER — Ambulatory Visit (INDEPENDENT_AMBULATORY_CARE_PROVIDER_SITE_OTHER): Payer: Federal, State, Local not specified - PPO | Admitting: Surgery

## 2012-05-01 VITALS — BP 132/84 | HR 88 | Temp 98.1°F | Resp 18 | Ht 63.0 in | Wt 213.6 lb

## 2012-05-01 DIAGNOSIS — N6009 Solitary cyst of unspecified breast: Secondary | ICD-10-CM

## 2012-05-01 DIAGNOSIS — N6310 Unspecified lump in the right breast, unspecified quadrant: Secondary | ICD-10-CM | POA: Insufficient documentation

## 2012-05-01 NOTE — Progress Notes (Signed)
NAME: Melissa Pitts DOB: [birth year] MRN: [id number]                                                                                      DATE: 05/01/2012  PCP: Colette Ribas, MD Referring Provider: Annamaria Boots, MD  IMPRESSION:  Breast cyst, right breast, upper-outer quadrant,possibly slightly larger over a 4 year period Family history of breast cancer (mother  And grandmother, both postmenopausal)  PLAN:   I recommended that she simply continue with routine mammograms and ultrasounds as indicated. I didn't see any need for surgical intervention at this time.                 CC:  Chief Complaint  Patient presents with  . Breast Mass    Right    HPI:  Melissa Pitts is a 52 y.o.  female who presents for evaluation of a right breast mass. She's had a cyst noted on prior ultrasounds dating back to 2009. Apparently on a recent mammogram (August, 2013,) it was 1.8 cm. This slightly larger than had been noted previously but the radiologist reviewed the films and thought this was a stable area. She is having no symptoms. Both her mother and grandmother had postmenopausal breast cancer. She's never had any other breast problems. She is having no breast symptoms and she has not been able to palpate the mass.  PMH:  has a past medical history of Allergy; Depression; Diabetes mellitus; Hypertension; Thyroid disease; and Sleep apnea.  PSH:   has past surgical history that includes Vaginal hysterectomy (11/2007).  ALLERGIES:   Allergies  Allergen Reactions  . Penicillins Rash  . Sulfur Rash    MEDICATIONS: Current outpatient prescriptions:buPROPion (WELLBUTRIN XL) 150 MG 24 hr tablet, 1 tablet Daily., Disp: , Rfl: ;  escitalopram (LEXAPRO) 20 MG tablet, 1 tablet Daily., Disp: , Rfl: ;  levothyroxine (SYNTHROID, LEVOTHROID) 50 MCG tablet, 1 tablet Daily., Disp: , Rfl: ;  losartan (COZAAR) 25 MG tablet, Takes 100mg  daily, Disp: , Rfl: ;  metFORMIN (GLUCOPHAGE) 500 MG tablet, 1 tablet  Daily., Disp: , Rfl:   ROS: She has filled out our 12 point review of systems and it is negative . EXAM:   Vital signs:BP 132/84  Pulse 88  Temp 98.1 F (36.7 C) (Temporal)  Resp 18  Ht 5\' 3"  (1.6 m)  Wt 213 lb 9.6 oz (96.888 kg)  BMI 37.84 kg/m2 General: The patient is alert oriented healthy-appearing, NAD Breasts: Symmetric. Nontender. No mass. No skin nipple or areolar changes. The described mass on the mammograms and prior ultrasounds is not palpable. Lymphatics: There is no axillary or supraclavicular adenopathy.  DATA REVIEWED:  I have reviewed the mammogram and ultrasound reports. I did an office ultrasound and found a cystic mass in the pedicle in position as previously described. On our ultrasound it measures 1.7 cm and appears to be a simple cyst.    Reuel Lamadrid J 05/01/2012  CC: Annamaria Boots, MD, Colette Ribas, MD

## 2012-05-01 NOTE — Patient Instructions (Signed)
I think you should just continue annual mammograms and an ultrasound if any change in the cyst in the right breast

## 2012-05-11 ENCOUNTER — Encounter (INDEPENDENT_AMBULATORY_CARE_PROVIDER_SITE_OTHER): Payer: Self-pay

## 2012-06-25 DIAGNOSIS — E039 Hypothyroidism, unspecified: Secondary | ICD-10-CM | POA: Insufficient documentation

## 2012-06-25 DIAGNOSIS — I10 Essential (primary) hypertension: Secondary | ICD-10-CM | POA: Insufficient documentation

## 2012-06-25 DIAGNOSIS — F32A Depression, unspecified: Secondary | ICD-10-CM | POA: Insufficient documentation

## 2012-06-25 DIAGNOSIS — E1169 Type 2 diabetes mellitus with other specified complication: Secondary | ICD-10-CM | POA: Insufficient documentation

## 2012-06-25 DIAGNOSIS — F329 Major depressive disorder, single episode, unspecified: Secondary | ICD-10-CM | POA: Insufficient documentation

## 2012-06-25 DIAGNOSIS — G4733 Obstructive sleep apnea (adult) (pediatric): Secondary | ICD-10-CM | POA: Insufficient documentation

## 2012-11-14 HISTORY — PX: GASTRIC BYPASS: SHX52

## 2012-11-21 ENCOUNTER — Encounter (HOSPITAL_COMMUNITY): Payer: Self-pay

## 2012-11-21 ENCOUNTER — Emergency Department (HOSPITAL_COMMUNITY)
Admission: EM | Admit: 2012-11-21 | Discharge: 2012-11-21 | Disposition: A | Discharge status: Other Acute Inpatient Hospital | Payer: Federal, State, Local not specified - PPO | Attending: Emergency Medicine | Admitting: Emergency Medicine

## 2012-11-21 ENCOUNTER — Emergency Department (HOSPITAL_COMMUNITY): Payer: Federal, State, Local not specified - PPO

## 2012-11-21 DIAGNOSIS — E872 Acidosis, unspecified: Secondary | ICD-10-CM | POA: Insufficient documentation

## 2012-11-21 DIAGNOSIS — E639 Nutritional deficiency, unspecified: Secondary | ICD-10-CM | POA: Insufficient documentation

## 2012-11-21 DIAGNOSIS — R141 Gas pain: Secondary | ICD-10-CM | POA: Insufficient documentation

## 2012-11-21 DIAGNOSIS — Z90710 Acquired absence of both cervix and uterus: Secondary | ICD-10-CM | POA: Insufficient documentation

## 2012-11-21 DIAGNOSIS — R231 Pallor: Secondary | ICD-10-CM | POA: Insufficient documentation

## 2012-11-21 DIAGNOSIS — R140 Abdominal distension (gaseous): Secondary | ICD-10-CM

## 2012-11-21 DIAGNOSIS — Z79899 Other long term (current) drug therapy: Secondary | ICD-10-CM | POA: Insufficient documentation

## 2012-11-21 DIAGNOSIS — F329 Major depressive disorder, single episode, unspecified: Secondary | ICD-10-CM | POA: Insufficient documentation

## 2012-11-21 DIAGNOSIS — F3289 Other specified depressive episodes: Secondary | ICD-10-CM | POA: Insufficient documentation

## 2012-11-21 DIAGNOSIS — Z9889 Other specified postprocedural states: Secondary | ICD-10-CM | POA: Insufficient documentation

## 2012-11-21 DIAGNOSIS — Z880 Allergy status to penicillin: Secondary | ICD-10-CM | POA: Insufficient documentation

## 2012-11-21 DIAGNOSIS — R142 Eructation: Secondary | ICD-10-CM | POA: Insufficient documentation

## 2012-11-21 DIAGNOSIS — R109 Unspecified abdominal pain: Secondary | ICD-10-CM | POA: Insufficient documentation

## 2012-11-21 DIAGNOSIS — Z9884 Bariatric surgery status: Secondary | ICD-10-CM | POA: Insufficient documentation

## 2012-11-21 DIAGNOSIS — N289 Disorder of kidney and ureter, unspecified: Secondary | ICD-10-CM

## 2012-11-21 DIAGNOSIS — E039 Hypothyroidism, unspecified: Secondary | ICD-10-CM | POA: Insufficient documentation

## 2012-11-21 DIAGNOSIS — I10 Essential (primary) hypertension: Secondary | ICD-10-CM | POA: Insufficient documentation

## 2012-11-21 DIAGNOSIS — Z9981 Dependence on supplemental oxygen: Secondary | ICD-10-CM | POA: Insufficient documentation

## 2012-11-21 DIAGNOSIS — A419 Sepsis, unspecified organism: Secondary | ICD-10-CM | POA: Insufficient documentation

## 2012-11-21 DIAGNOSIS — K631 Perforation of intestine (nontraumatic): Secondary | ICD-10-CM

## 2012-11-21 DIAGNOSIS — E119 Type 2 diabetes mellitus without complications: Secondary | ICD-10-CM | POA: Insufficient documentation

## 2012-11-21 DIAGNOSIS — I959 Hypotension, unspecified: Secondary | ICD-10-CM

## 2012-11-21 DIAGNOSIS — R0682 Tachypnea, not elsewhere classified: Secondary | ICD-10-CM | POA: Insufficient documentation

## 2012-11-21 DIAGNOSIS — G4730 Sleep apnea, unspecified: Secondary | ICD-10-CM | POA: Insufficient documentation

## 2012-11-21 HISTORY — PX: OTHER SURGICAL HISTORY: SHX169

## 2012-11-21 LAB — CBC WITH DIFFERENTIAL/PLATELET
Basophils Absolute: 0 10*3/uL (ref 0.0–0.1)
Basophils Relative: 0 % (ref 0–1)
Eosinophils Absolute: 0 10*3/uL (ref 0.0–0.7)
Eosinophils Relative: 0 % (ref 0–5)
HCT: 38.8 % (ref 36.0–46.0)
Hemoglobin: 13.2 g/dL (ref 12.0–15.0)
Lymphocytes Relative: 9 % — ABNORMAL LOW (ref 12–46)
Lymphs Abs: 2.4 10*3/uL (ref 0.7–4.0)
MCH: 32 pg (ref 26.0–34.0)
MCHC: 34 g/dL (ref 30.0–36.0)
MCV: 94.2 fL (ref 78.0–100.0)
Monocytes Absolute: 1.8 10*3/uL — ABNORMAL HIGH (ref 0.1–1.0)
Monocytes Relative: 7 % (ref 3–12)
Neutro Abs: 24.2 10*3/uL — ABNORMAL HIGH (ref 1.7–7.7)
Neutrophils Relative %: 85 % — ABNORMAL HIGH (ref 43–77)
Platelets: 266 10*3/uL (ref 150–400)
RBC: 4.12 MIL/uL (ref 3.87–5.11)
RDW: 14.7 % (ref 11.5–15.5)
WBC Morphology: INCREASED
WBC: 28.5 10*3/uL — ABNORMAL HIGH (ref 4.0–10.5)

## 2012-11-21 LAB — URINALYSIS, ROUTINE W REFLEX MICROSCOPIC
Glucose, UA: NEGATIVE mg/dL
Ketones, ur: 15 mg/dL — AB
Leukocytes, UA: NEGATIVE
Nitrite: NEGATIVE
Protein, ur: 30 mg/dL — AB
Specific Gravity, Urine: >1.03 — ABNORMAL HIGH (ref 1.005–1.030)
Urobilinogen, UA: 0.2 mg/dL (ref 0.0–1.0)
pH: 6 (ref 5.0–8.0)

## 2012-11-21 LAB — COMPREHENSIVE METABOLIC PANEL
ALT: 32 U/L (ref 0–35)
AST: 19 U/L (ref 0–37)
Albumin: 2.3 g/dL — ABNORMAL LOW (ref 3.5–5.2)
Alkaline Phosphatase: 54 U/L (ref 39–117)
BUN: 31 mg/dL — ABNORMAL HIGH (ref 6–23)
CO2: 16 mEq/L — ABNORMAL LOW (ref 19–32)
Calcium: 7.9 mg/dL — ABNORMAL LOW (ref 8.4–10.5)
Chloride: 102 mEq/L (ref 96–112)
Creatinine, Ser: 3.24 mg/dL — ABNORMAL HIGH (ref 0.50–1.10)
GFR calc Af Amer: 18 mL/min — ABNORMAL LOW (ref >90)
GFR calc non Af Amer: 15 mL/min — ABNORMAL LOW (ref >90)
Glucose, Bld: 147 mg/dL — ABNORMAL HIGH (ref 70–99)
Potassium: 3.8 mEq/L (ref 3.5–5.1)
Sodium: 136 mEq/L (ref 135–145)
Total Bilirubin: 0.8 mg/dL (ref 0.3–1.2)
Total Protein: 5.7 g/dL — ABNORMAL LOW (ref 6.0–8.3)

## 2012-11-21 LAB — URINE MICROSCOPIC-ADD ON

## 2012-11-21 LAB — SAMPLE TO BLOOD BANK

## 2012-11-21 LAB — LACTIC ACID, PLASMA: Lactic Acid, Venous: 6.1 mmol/L — ABNORMAL HIGH (ref 0.5–2.2)

## 2012-11-21 MED ORDER — SODIUM CHLORIDE 0.9 % IV SOLN
1000.0000 mL | Freq: Once | INTRAVENOUS | Status: AC
  Administered 2012-11-21: 1000 mL via INTRAVENOUS

## 2012-11-21 MED ORDER — LEVOFLOXACIN IN D5W 750 MG/150ML IV SOLN
750.0000 mg | Freq: Once | INTRAVENOUS | Status: AC
  Administered 2012-11-21: 750 mg via INTRAVENOUS
  Filled 2012-11-21: qty 150

## 2012-11-21 MED ORDER — FENTANYL CITRATE 0.05 MG/ML IJ SOLN
50.0000 ug | Freq: Once | INTRAMUSCULAR | Status: AC
  Administered 2012-11-21: 50 ug via INTRAVENOUS
  Filled 2012-11-21: qty 2

## 2012-11-21 MED ORDER — METRONIDAZOLE IN NACL 5-0.79 MG/ML-% IV SOLN
500.0000 mg | Freq: Once | INTRAVENOUS | Status: AC
  Administered 2012-11-21: 500 mg via INTRAVENOUS
  Filled 2012-11-21: qty 100

## 2012-11-21 MED ORDER — SODIUM CHLORIDE 0.9 % IV SOLN
1000.0000 mL | INTRAVENOUS | Status: DC
  Administered 2012-11-21 (×2): 1000 mL via INTRAVENOUS

## 2012-11-21 NOTE — ED Notes (Signed)
Pt had gastric bypass surgery at [facility] on the 13th, states she started having abd pain on Monday and tonight with episode of low blood pressure

## 2012-11-21 NOTE — ED Provider Notes (Signed)
CSN: [id number]     Arrival date & time 11/21/12  0404 History     First MD Initiated Contact with Patient 11/21/12 [phone]     Chief Complaint  Patient presents with  . Hypotension   (Consider location/radiation/quality/duration/timing/severity/associated sxs/prior Treatment) HPI Patient reports she had a laparoscopic rou en Y or gastric bypass surgery done on August 13 at [facility] by Dr. Barnetta Chapel. She reports she stayed in the hospital 2 nights and was discharged. She reports having limited oral intake since she had her surgery. She reports on Monday, August 18 she started having excruciating pain diffusely in her abdomen but worse in the lower portion. She states any type of movement or deep breathing makes the pain worse. She states she feels like she can't take a big deep breath. She thought she was having gas problems and call her surgeon. She was advised to take beano or Gas-X. She denies nausea, vomiting, fever, chest pain, or shortness of breath. She does report she is been feeling chilled. EMS was called to her house and they stated initially she had no pulse to palpation in her wrist. She was alert and talking. They started an IV and then they report her blood pressure was 120/60 however just prior to getting to the ED it dropped to 90/50. They report their initial pulse was 129 with respiratory rate of 40.   PCP Dr Phillips Odor  Past Medical History  Diagnosis Date  . Allergy   . Depression   . Diabetes mellitus   . Hypertension   . Thyroid disease     hypothyroid  . Sleep apnea     wears CPAP   Past Surgical History  Procedure Laterality Date  . Vaginal hysterectomy  11/2007  . Gastric bypass     Family History  Problem Relation Age of Onset  . Colon cancer Neg Hx   . Esophageal cancer Neg Hx   . Rectal cancer Neg Hx   . Stomach cancer Neg Hx   . Cancer Mother     Breast  . Cancer Maternal Grandmother     Breast   History  Substance Use Topics  . Smoking  status: Never Smoker   . Smokeless tobacco: Never Used  . Alcohol Use: No  lives at home employed   OB History   Grav Para Term Preterm Abortions TAB SAB Ect Mult Living                 Review of Systems  All other systems reviewed and are negative.    Allergies  Penicillins and Sulfur  Home Medications   Current Outpatient Rx  Name  Route  Sig  Dispense  Refill  . buPROPion (WELLBUTRIN XL) 150 MG 24 hr tablet      1 tablet Daily.         Marland Kitchen escitalopram (LEXAPRO) 20 MG tablet      1 tablet Daily.         Marland Kitchen levothyroxine (SYNTHROID, LEVOTHROID) 50 MCG tablet      1 tablet Daily.         Marland Kitchen losartan (COZAAR) 25 MG tablet      Takes 100mg  daily         . metFORMIN (GLUCOPHAGE) 500 MG tablet      1 tablet Daily.         Marland Kitchen omeprazole (PRILOSEC) 20 MG capsule   Oral   Take 20 mg by mouth daily.         Marland Kitchen  oxyCODONE (ROXICODONE INTENSOL) 100 MG/5ML concentrated solution   Oral   Take 10 mg by mouth every 4 (four) hours as needed for pain.         . promethazine (PHENERGAN) 25 MG tablet   Oral   Take 25 mg by mouth every 6 (six) hours as needed for nausea.         . sennosides-docusate sodium (SENOKOT-S) 8.6-50 MG tablet   Oral   Take 1 tablet by mouth daily.         . ursodiol (ACTIGALL) 300 MG capsule   Oral   Take 300 mg by mouth 2 (two) times daily.          ED Triage Vitals  Enc Vitals Group     BP 11/21/12 0413 58/34 mmHg     Pulse Rate 11/21/12 0413 111     Resp 11/21/12 0413 22     Temp 11/21/12 0413 97.8 F (36.6 C)     Temp src 11/21/12 0413 Oral     SpO2 11/21/12 0413 98 %     Weight --      Height --      Head Cir --      Peak Flow --      Pain Score 11/21/12 0405 10     Pain Loc --      Pain Edu? --      Excl. in GC? --      Vital signs normal except for tachycardia, hypotension, and tachypnea  Physical Exam  Nursing note and vitals reviewed. Constitutional: She is oriented to person, place, and time. She  appears well-developed and well-nourished.  Non-toxic appearance. She does not appear ill. No distress.  HENT:  Head: Normocephalic and atraumatic.  Right Ear: External ear normal.  Left Ear: External ear normal.  Nose: Nose normal. No mucosal edema or rhinorrhea.  Mouth/Throat: Mucous membranes are normal. No dental abscesses or edematous.  Very dry tongue  Eyes: Conjunctivae and EOM are normal. Pupils are equal, round, and reactive to light.  Neck: Normal range of motion and full passive range of motion without pain. Neck supple.  Cardiovascular: Normal rate, regular rhythm and normal heart sounds.  Exam reveals no gallop and no friction rub.   No murmur heard. Pulmonary/Chest: Breath sounds normal. Tachypnea noted. She is in respiratory distress. She has no wheezes. She has no rhonchi. She has no rales. She exhibits no tenderness and no crepitus.  Abdominal: Soft. Normal appearance and bowel sounds are normal. She exhibits distension. There is tenderness. There is no rebound and no guarding.  Patient's abdomen is very distended and tight and tympanic  Musculoskeletal: Normal range of motion. She exhibits no edema and no tenderness.  Moves all extremities well.   Neurological: She is alert and oriented to person, place, and time. She has normal strength. No cranial nerve deficit.  Skin: Skin is warm, dry and intact. No rash noted. No erythema. There is pallor.  Psychiatric: She has a normal mood and affect. Her speech is normal and behavior is normal. Her mood appears not anxious.    ED Course   Medications  0.9 %  sodium chloride infusion (0 mL Intravenous Stopped 11/21/12 0432)    Followed by  0.9 %  sodium chloride infusion (0 mL Intravenous Stopped 11/21/12 0445)    Followed by  0.9 %  sodium chloride infusion (0 mL Intravenous Stopped 11/21/12 0502)    Followed by  0.9 %  sodium chloride infusion (  1,000 mL Intravenous New Bag/Given 11/21/12 0445)  levofloxacin (LEVAQUIN) IVPB 750  mg (750 mg Intravenous New Bag/Given 11/21/12 0446)  metroNIDAZOLE (FLAGYL) IVPB 500 mg (not administered)  fentaNYL (SUBLIMAZE) injection 50 mcg (50 mcg Intravenous Given 11/21/12 0509)      Procedures (including critical care time)  Initial blood pressure was 58/34 with heart rate 111. Patient was given 2 L of IV fluids and her blood pressure improved to 119/58 with heart rate of 116 at 04:25. She is getting her third liter of normal saline.  04:45 patient started on antibiotics, levaquin and flagyl.   04:55 D/W radiologist, feels has much more air than she should have after a week from laparascopic surgery.   05:00 pt's blood pressure has stabilized, still has tachycardia. We have discussed her xray findings and the possible need for surgery to find the etiology of her perforation.  She requests she be transferred to [facility] if possible for her surgery.   05:10 husband and child at bedside, have explained what has happened to her, and that I am waiting to talk to [facility] for transfer as long as she remains stable. Husband states she went to the bathroom this morning and thought she was constipated, states he called EMS because she got very weak.   05:15 her BP has been stable now, will try low dose fentanyl for pain.    05:17 Dr Berline Chough accepts in transfer to [facility] via ED. Does not want a CT scan to be done, is agreeable to her current antibiotic ordered.   05:20 her BP did drop slightly to 98 systolic after the fentanyl, but she states her pain is better. Have informed patient and her family she is going to be transferred to [facility] to see Dr Zenovia Jordan.   05:50 BP has improved to 104/53, HR is still 116.  05:57 Have discussed her renal function tests with patient and family. Also discussed she has sepsis.   Results for orders placed during the hospital encounter of 11/21/12  CBC WITH DIFFERENTIAL      Result Value Range   WBC 28.5 (*) 4.0 - 10.5 K/uL   RBC 4.12   3.87 - 5.11 MIL/uL   Hemoglobin 13.2  12.0 - 15.0 g/dL   HCT 16.1  09.6 - 04.5 %   MCV 94.2  78.0 - 100.0 fL   MCH 32.0  26.0 - 34.0 pg   MCHC 34.0  30.0 - 36.0 g/dL   RDW 40.9  81.1 - 91.4 %   Platelets 266  150 - 400 K/uL   Neutrophils Relative % 85 (*) 43 - 77 %   Neutro Abs 24.2 (*) 1.7 - 7.7 K/uL   Lymphocytes Relative 9 (*) 12 - 46 %   Lymphs Abs 2.4  0.7 - 4.0 K/uL   Monocytes Relative 7  3 - 12 %   Monocytes Absolute 1.8 (*) 0.1 - 1.0 K/uL   Eosinophils Relative 0  0 - 5 %   Eosinophils Absolute 0.0  0.0 - 0.7 K/uL   Basophils Relative 0  0 - 1 %   Basophils Absolute 0.0  0.0 - 0.1 K/uL   WBC Morphology INCREASED BANDS (>20% BANDS)    COMPREHENSIVE METABOLIC PANEL      Result Value Range   Sodium 136  135 - 145 mEq/L   Potassium 3.8  3.5 - 5.1 mEq/L   Chloride 102  96 - 112 mEq/L   CO2 16 (*) 19 - 32 mEq/L  Glucose, Bld 147 (*) 70 - 99 mg/dL   BUN 31 (*) 6 - 23 mg/dL   Creatinine, Ser 9.60 (*) 0.50 - 1.10 mg/dL   Calcium 7.9 (*) 8.4 - 10.5 mg/dL   Total Protein 5.7 (*) 6.0 - 8.3 g/dL   Albumin 2.3 (*) 3.5 - 5.2 g/dL   AST 19  0 - 37 U/L   ALT 32  0 - 35 U/L   Alkaline Phosphatase 54  39 - 117 U/L   Total Bilirubin 0.8  0.3 - 1.2 mg/dL   GFR calc non Af Amer 15 (*) >90 mL/min   GFR calc Af Amer 18 (*) >90 mL/min  LACTIC ACID, PLASMA      Result Value Range   Lactic Acid, Venous 6.1 (*) 0.5 - 2.2 mmol/L  SAMPLE TO BLOOD BANK      Result Value Range   Blood Bank Specimen SAMPLE AVAILABLE FOR TESTING     Sample Expiration 11/24/2012     Laboratory interpretation all normal except leukocytosis, lactic acidosis, metabolic acidosis, renal insufficiency .   Dg Abd Acute W/chest  11/21/2012   *RADIOLOGY REPORT*  Clinical Data: Hypotension after gastric bypass 1 week ago. Acute onset of abdominal pain.  Abdominal distension.  ACUTE ABDOMEN SERIES (ABDOMEN 2 VIEW & CHEST 1 VIEW)  Comparison: Barium enema 04/22/2011  Findings: Shallow inspiration with elevation of the  hemidiaphragms. Heart size and pulmonary vascularity are normal for technique. Atelectasis in the lung bases.  There is a large amount of free intra-abdominal air. This appears more prominent than would be expected this far out from surgery. Bowel perforation should be excluded.  Scattered gas and stool in the colon.  No bowel distension.  No radiopaque stones.  IMPRESSION: Shallow inspiration with atelectasis in the lung bases.  Large amount of free intra-abdominal air, possibly postoperative, but more than would be expected.  Bowel perforation should be excluded. No bowel obstruction.  Telephone report to Dr. Lynelle Doctor at 0450 hours on 11/21/2012.   Original Report Authenticated By: Burman Nieves, M.D.      Date: 11/21/2012  Rate: 109  Rhythm: sinus tachycardia  QRS Axis: normal  Intervals: QT prolonged  ST/T Wave abnormalities: normal  Conduction Disutrbances:none  Narrative Interpretation:   Old EKG Reviewed: changes noted from 11/10/2008 HR was 84    1. Hypotension   2. Perforation bowel   3. Abdominal distension   4. Abdominal pain   5. Renal insufficiency   6. Sepsis   7. Metabolic acidosis   8. Lactic acidosis     Plan transfer to [facility]   Devoria Albe, MD, FACEP    CRITICAL CARE Performed by: Devoria Albe L Total critical care time: 45 min Critical care time was exclusive of separately billable procedures and treating other patients. Critical care was necessary to treat or prevent imminent or life-threatening deterioration. Critical care was time spent personally by me on the following activities: development of treatment plan with patient and/or surrogate as well as nursing, discussions with consultants, evaluation of patient's response to treatment, examination of patient, obtaining history from patient or surrogate, ordering and performing treatments and interventions, ordering and review of laboratory studies, ordering and review of radiographic studies, pulse  oximetry and re-evaluation of patient's condition.    MDM          Ward Givens, MD 11/21/12 [phone]

## 2012-12-24 DIAGNOSIS — R6510 Systemic inflammatory response syndrome (SIRS) of non-infectious origin without acute organ dysfunction: Secondary | ICD-10-CM | POA: Insufficient documentation

## 2013-02-08 DIAGNOSIS — K289 Gastrojejunal ulcer, unspecified as acute or chronic, without hemorrhage or perforation: Secondary | ICD-10-CM | POA: Insufficient documentation

## 2013-04-22 ENCOUNTER — Encounter: Payer: Self-pay | Admitting: Obstetrics and Gynecology

## 2013-04-22 ENCOUNTER — Ambulatory Visit (INDEPENDENT_AMBULATORY_CARE_PROVIDER_SITE_OTHER): Payer: Federal, State, Local not specified - PPO | Admitting: Obstetrics and Gynecology

## 2013-04-22 VITALS — BP 127/79 | HR 93 | Resp 18 | Ht 62.75 in | Wt 145.0 lb

## 2013-04-22 DIAGNOSIS — Z01419 Encounter for gynecological examination (general) (routine) without abnormal findings: Secondary | ICD-10-CM

## 2013-04-22 NOTE — Patient Instructions (Addendum)
EXERCISE AND DIET:  We recommended that you start or continue a regular exercise program for good health. Regular exercise means any activity that makes your heart beat faster and makes you sweat.  We recommend exercising at least 30 minutes per day at least 3 days a week, preferably 4 or 5.  We also recommend a diet low in fat and sugar.  Inactivity, poor dietary choices and obesity can cause diabetes, heart attack, stroke, and kidney damage, among others.    ALCOHOL AND SMOKING:  Women should limit their alcohol intake to no more than 7 drinks/beers/glasses of wine (combined, not each!) per week. Moderation of alcohol intake to this level decreases your risk of breast cancer and liver damage. And of course, no recreational drugs are part of a healthy lifestyle.  And absolutely no smoking or even second hand smoke. Most people know smoking can cause heart and lung diseases, but did you know it also contributes to weakening of your bones? Aging of your skin?  Yellowing of your teeth and nails?  CALCIUM AND VITAMIN D:  Adequate intake of calcium and Vitamin D are recommended.  The recommendations for exact amounts of these supplements seem to change often, but generally speaking 600 mg of calcium (either carbonate or citrate) and 800 units of Vitamin D per day seems prudent. Certain women may benefit from higher intake of Vitamin D.  If you are among these women, your doctor will have told you during your visit.    PAP SMEARS:  Pap smears, to check for cervical cancer or precancers,  have traditionally been done yearly, although recent scientific advances have shown that most women can have pap smears less often.  However, every woman still should have a physical exam from her gynecologist every year. It will include a breast check, inspection of the vulva and vagina to check for abnormal growths or skin changes, a visual exam of the cervix, and then an exam to evaluate the size and shape of the uterus and  ovaries.  And after 55 years of age, a rectal exam is indicated to check for rectal cancers. We will also provide age appropriate advice regarding health maintenance, like when you should have certain vaccines, screening for sexually transmitted diseases, bone density testing, colonoscopy, mammograms, etc.   MAMMOGRAMS:  All women over 40 years old should have a yearly mammogram. Many facilities now offer a "3D" mammogram, which may cost around $50 extra out of pocket. If possible,  we recommend you accept the option to have the 3D mammogram performed.  It both reduces the number of women who will be called back for extra views which then turn out to be normal, and it is better than the routine mammogram at detecting truly abnormal areas.    COLONOSCOPY:  Colonoscopy to screen for colon cancer is recommended for all women at age 50.  We know, you hate the idea of the prep.  We agree, BUT, having colon cancer and not knowing it is worse!!  Colon cancer so often starts as a polyp that can be seen and removed at colonscopy, which can quite literally save your life!  And if your first colonoscopy is normal and you have no family history of colon cancer, most women don't have to have it again for 10 years.  Once every ten years, you can do something that may end up saving your life, right?  We will be happy to help you get it scheduled when you are ready.    Be sure to check your insurance coverage so you understand how much it will cost.  It may be covered as a preventative service at no cost, but you should check your particular policy.    Atrophic Vaginitis Atrophic vaginitis is a problem of low levels of estrogen in women. This problem can happen at any age. It is most common in women who have gone through menopause ("the change").  HOW WILL I KNOW IF I HAVE THIS PROBLEM? You may have:  Trouble with peeing (urinating), such as:  Going to the bathroom often.  A hard time holding your pee until you reach a  bathroom.  Leaking pee.  Having pain when you pee.  Itching or a burning feeling.  Vaginal bleeding and spotting.  Pain during sex.  Dryness of the vagina.  A yellow, bad-smelling fluid (discharge) coming from the vagina. HOW WILL MY DOCTOR CHECK FOR THIS PROBLEM?  During your exam, your doctor will likely find the problem.  If there is a vaginal fluid, it may be checked for infection. HOW WILL THIS PROBLEM BE TREATED? Keep the vulvar skin as clean as possible. Moisturizers and lubricants can help with some of the symptoms. Estrogen replacement can help. There are 2 ways to take estrogen:  Systemic estrogen gets estrogen to your whole body. It takes many weeks or months before the symptoms get better.  You take an estrogen pill.  You use a skin patch. This is a patch that you put on your skin.  If you still have your uterus, your doctor may ask you to take a hormone. Talk to your doctor about the right medicine for you.  Estrogen cream.  This puts estrogen only at the part of your body where you apply it. The cream is put into the vagina or put on the vulvar skin. For some women, estrogen cream works faster than pills or the patch. CAN ALL WOMEN WITH THIS PROBLEM USE ESTROGEN? No. Women with certain types of cancer, liver problems, or problems with blood clots should not take estrogen. Your doctor can help you decide the best treatment for your symptoms. Document Released: 09/07/2007 Document Revised: 06/13/2011 Document Reviewed: 09/07/2007 [employer] Patient Information 2014 [employer].  

## 2013-04-22 NOTE — Progress Notes (Signed)
GYNECOLOGY VISIT  PCP: Assunta FoundJohn Golding, MD  Referring provider:   HPI: 53 y.o.   Married  Caucasian  female   [id number] with No LMP recorded. Patient has had a hysterectomy.   here for   ExamAnnual   Having vaginal dryness.  Has gastric bypass surgery in August 2014.  Had complications with leaking anastomosis and need for emergency surgery post op.  Had a gastric tube. Lost 70 pounds.  New diagnosis of ulcer.  No further sleep apnea. Off metformin and Cozar for HTN.  Lost a lot of hair but receiving feedback that it will grow back.  No can say that she is pleased she did the surgery.   Did not do a mammogram last year due to her health problems following the gastric bypass. Did see Dr. Jamey RipaStreck in January, 2014 for re-evaluation of right breast cyst and was given reassurance that this did not need biopsy.  Hgb:  PCP   Urine:  PCP  GYNECOLOGIC HISTORY: No LMP recorded. Patient has had a hysterectomy. Sexually active:  yes Partner preference:female  Contraception:   TVH, Menopausal Menopausal hormone therapy:  DES exposure:   no Blood transfusions: no   Sexually transmitted diseases:  no  GYN Procedures: no  Mammogram: 11/17/11 need additional Imaging                Pap: 12/14/07 neg   History of abnormal pap smear:  no   OB History   Grav Para Term Preterm Abortions TAB SAB Ect Mult Living   2 1   1  1   1        LIFESTYLE: Exercise:no               Tobacco: no Alcohol:no Drug use:  no  OTHER HEALTH MAINTENANCE: Tetanus/TDap:  2013 Gardisil:no Influenza: yes  Zostavax: no  Bone density:no Colonoscopy: 04/2011 normal, repeat in 10 years  Cholesterol check: 03/2013 normal  Family History  Problem Relation Age of Onset  . Colon cancer Neg Hx   . Esophageal cancer Neg Hx   . Rectal cancer Neg Hx   . Stomach cancer Neg Hx   . Hypertension Mother   . Breast cancer Mother   . Cancer Maternal Grandmother     Breast  . Cancer Father   . Heart disease Father      Patient Active Problem List   Diagnosis Date Noted  . Breast mass, right 05/01/2012   Past Medical History  Diagnosis Date  . Allergy   . Depression   . Diabetes mellitus   . Hypertension   . Thyroid disease     hypothyroid  . Sleep apnea     wears CPAP  . Breast mass     Past Surgical History  Procedure Laterality Date  . Gastric bypass    . Vaginal hysterectomy  11/2007    ALLERGIES: Sulfamethoxazole; Penicillins; and Sulfur  Current Outpatient Prescriptions  Medication Sig Dispense Refill  . escitalopram (LEXAPRO) 20 MG tablet 1 tablet Daily.      Marland Kitchen. levothyroxine (SYNTHROID, LEVOTHROID) 50 MCG tablet 1 tablet Daily.      Marland Kitchen. omeprazole (PRILOSEC) 20 MG capsule Take 20 mg by mouth daily.       No current facility-administered medications for this visit.     ROS:  Pertinent items are noted in HPI.  SOCIAL HISTORY:  Claims rep.   PHYSICAL EXAMINATION:    BP 127/79  Pulse 93  Resp 18  Ht 5' 2.75" (1.594  m)  Wt 145 lb (65.772 kg)  BMI 25.89 kg/m2   Wt Readings from Last 3 Encounters:  04/22/13 145 lb (65.772 kg)  05/01/12 213 lb 9.6 oz (96.888 kg)  04/20/11 216 lb (97.977 kg)     Ht Readings from Last 3 Encounters:  04/22/13 5' 2.75" (1.594 m)  05/01/12 5\' 3"  (1.6 m)  04/20/11 5\' 3"  (1.6 m)    General appearance: alert, cooperative and appears stated age Head: Normocephalic, without obvious abnormality, atraumatic Neck: no adenopathy, supple, symmetrical, trachea midline and thyroid not enlarged, symmetric, no tenderness/mass/nodules Lungs: clear to auscultation bilaterally Breasts: Inspection negative, No nipple retraction or dimpling, No nipple discharge or bleeding, No axillary or supraclavicular adenopathy, Normal to palpation without dominant masses Heart: regular rate and rhythm Abdomen: multiple scattered abdominal laparoscopic type incisions, soft, non-tender; no masses,  no organomegaly Extremities: extremities normal, atraumatic, no cyanosis  or edema Skin: Skin color, texture, turgor normal. No rashes or lesions Lymph nodes: Cervical, supraclavicular, and axillary nodes normal. No abnormal inguinal nodes palpated Neurologic: Grossly normal  Pelvic: External genitalia:  no lesions              Urethra:  normal appearing urethra with no masses, tenderness or lesions              Bartholins and Skenes: normal                 Vagina: normal appearing vagina with normal color and discharge, no lesions              Cervix:  absent              Pap and high risk HPV testing done: no.            Bimanual Exam:  Uterus:   absent                                      Adnexa: normal adnexa in size, nontender and no masses                                      Rectovaginal: Confirms                                      Anus:  normal sphincter tone, no lesions  ASSESSMENT  Normal gynecologic exam. Status post vaginal hysterectomy.  Atrophic vaginitis.  Status post gastric bypass with extensive weight loss short term.  History of right breast cyst.  Family history of breast cancer - mother and maternal grandmother.   PLAN  M[facility]ogram at [facility].  Order placed.  Patient will call. Pap smear and high risk HPV testing not indicated.  Counseled on adequate intake of calcium and vitamin D. Will try over the counter options for vaginal dryness - cooking oils, and vitamin E suppositories. Return annually or prn   An After Visit Summary was printed and given to the patient.

## 2013-04-25 ENCOUNTER — Other Ambulatory Visit: Payer: Self-pay | Admitting: Obstetrics and Gynecology

## 2013-04-25 DIAGNOSIS — Z1231 Encounter for screening mammogram for malignant neoplasm of breast: Secondary | ICD-10-CM

## 2013-05-04 ENCOUNTER — Ambulatory Visit (INDEPENDENT_AMBULATORY_CARE_PROVIDER_SITE_OTHER): Payer: Federal, State, Local not specified - PPO | Admitting: Internal Medicine

## 2013-05-04 VITALS — BP 134/80 | HR 104 | Temp 97.8°F | Resp 20 | Ht 63.0 in | Wt 144.0 lb

## 2013-05-04 DIAGNOSIS — R1011 Right upper quadrant pain: Secondary | ICD-10-CM

## 2013-05-04 DIAGNOSIS — Z9889 Other specified postprocedural states: Secondary | ICD-10-CM

## 2013-05-04 DIAGNOSIS — K289 Gastrojejunal ulcer, unspecified as acute or chronic, without hemorrhage or perforation: Secondary | ICD-10-CM

## 2013-05-04 DIAGNOSIS — Z9884 Bariatric surgery status: Secondary | ICD-10-CM

## 2013-05-04 LAB — POCT CBC
Granulocyte percent: 64.3 %G (ref 37–80)
HCT, POC: 44.3 % (ref 37.7–47.9)
Hemoglobin: 13.9 g/dL (ref 12.2–16.2)
Lymph, poc: 2.6 (ref 0.6–3.4)
MCH, POC: 28.3 pg (ref 27–31.2)
MCHC: 31.4 g/dL — AB (ref 31.8–35.4)
MCV: 90.3 fL (ref 80–97)
MID (cbc): 0.5 (ref 0–0.9)
MPV: 12.8 fL (ref 0–99.8)
POC Granulocyte: 5.6 (ref 2–6.9)
POC LYMPH PERCENT: 29.4 %L (ref 10–50)
POC MID %: 6.3 %M (ref 0–12)
Platelet Count, POC: 161 10*3/uL (ref 142–424)
RBC: 4.91 M/uL (ref 4.04–5.48)
RDW, POC: 21.5 %
WBC: 8.7 10*3/uL (ref 4.6–10.2)

## 2013-05-04 LAB — COMPREHENSIVE METABOLIC PANEL
ALT: 27 U/L (ref 0–35)
AST: 20 U/L (ref 0–37)
Albumin: 4.3 g/dL (ref 3.5–5.2)
Alkaline Phosphatase: 51 U/L (ref 39–117)
BUN: 19 mg/dL (ref 6–23)
CO2: 27 mEq/L (ref 19–32)
Calcium: 9.6 mg/dL (ref 8.4–10.5)
Chloride: 102 mEq/L (ref 96–112)
Creat: 0.53 mg/dL (ref 0.50–1.10)
Glucose, Bld: 86 mg/dL (ref 70–99)
Potassium: 3.8 mEq/L (ref 3.5–5.3)
Sodium: 141 mEq/L (ref 135–145)
Total Bilirubin: 0.4 mg/dL (ref 0.2–1.2)
Total Protein: 6.9 g/dL (ref 6.0–8.3)

## 2013-05-04 LAB — POCT UA - MICROSCOPIC ONLY
Casts, Ur, LPF, POC: NEGATIVE
Crystals, Ur, HPF, POC: NEGATIVE
Yeast, UA: NEGATIVE

## 2013-05-04 LAB — POCT URINALYSIS DIPSTICK
Blood, UA: NEGATIVE
Glucose, UA: NEGATIVE
Ketones, UA: 80
Leukocytes, UA: NEGATIVE
Nitrite, UA: NEGATIVE
Protein, UA: 30
Spec Grav, UA: 1.02
Urobilinogen, UA: 0.2
pH, UA: 5.5

## 2013-05-04 LAB — POCT SEDIMENTATION RATE: POCT SED RATE: 23 mm/hr — AB (ref 0–22)

## 2013-05-04 NOTE — Progress Notes (Signed)
Subjective:    Patient ID: Melissa Pitts, female    DOB: [birth year], 53 y.o.   MRN: [id number] This chart was scribed for Melissa Sia, MD by Nicholos Johns, Medical Scribe. This patient's care was started at 2:20 PM.  Abdominal Pain Pertinent negatives include no constipation, diarrhea, fever or frequency.   HPI Comments: Melissa Pitts is a 53 y.o. female who presents to the [facility] complaining of constatn RLQ pain, onset a few days ago. Pt also reports chills and nausea. Reports some movments make pain worse, chills, no  taking a deep breath does not worsen pain. Pt states she does not have usual UTI symptoms when she has had them in the past. Pt had a bypass performed at [facility] by Dr. Barnetta Chapel in August 2014; reports she has lost approximately 75 lbs since then. Pt subsequently suffered a leak and intra-abdominal abscess following bypass. Denies vomitting, diarrhea, constipation, fever, difficulty urinating, or burning with urination. Symptoms are not definitely postprandial.  Review of Systems  Constitutional: Negative for fever, chills, activity change, appetite change, fatigue and unexpected weight change.  Respiratory: Negative for cough and chest tightness.   Cardiovascular: Negative for chest pain and palpitations.  Gastrointestinal: Positive for abdominal pain. Negative for diarrhea, constipation and blood in stool.  Genitourinary: Negative for frequency and difficulty urinating.  Musculoskeletal: Negative for back pain.       Objective:   Physical Exam  Nursing note and vitals reviewed. Constitutional: She is oriented to person, place, and time. She appears well-developed and well-nourished. No distress.  HENT:  Head: Normocephalic and atraumatic.  Eyes: EOM are normal. Pupils are equal, round, and reactive to light.  Neck: Neck supple. No thyromegaly present.  Cardiovascular: Normal rate, regular rhythm and normal heart sounds.     Pulmonary/Chest: Effort normal and breath sounds normal. No respiratory distress. She exhibits no tenderness.  Abdominal: She exhibits no distension and no mass. There is tenderness. There is no rebound and no guarding.  Tender to palpation over RUQ and RMQ.   Musculoskeletal: Normal range of motion.  Lymphadenopathy:    She has no cervical adenopathy.  Neurological: She is alert and oriented to person, place, and time.  Skin: Skin is warm and dry.  Psychiatric: She has a normal mood and affect. Her behavior is normal.   Results for orders placed in visit on 05/04/13  COMPREHENSIVE METABOLIC PANEL      Result Value Range   Sodium 141  135 - 145 mEq/L   Potassium 3.8  3.5 - 5.3 mEq/L   Chloride 102  96 - 112 mEq/L   CO2 27  19 - 32 mEq/L   Glucose, Bld 86  70 - 99 mg/dL   BUN 19  6 - 23 mg/dL   Creat 4.69  6.29 - 5.28 mg/dL   Total Bilirubin 0.4  0.2 - 1.2 mg/dL   Alkaline Phosphatase 51  39 - 117 U/L   AST 20  0 - 37 U/L   ALT 27  0 - 35 U/L   Total Protein 6.9  6.0 - 8.3 g/dL   Albumin 4.3  3.5 - 5.2 g/dL   Calcium 9.6  8.4 - 41.3 mg/dL  POCT SEDIMENTATION RATE      Result Value Range   POCT SED RATE 23 (*) 0 - 22 mm/hr  POCT UA - MICROSCOPIC ONLY      Result Value Range   WBC, Ur, HPF, POC 0-1  RBC, urine, microscopic 0-1     Bacteria, U Microscopic trace     Mucus, UA trace     Epithelial cells, urine per micros 0-1     Crystals, Ur, HPF, POC neg     Casts, Ur, LPF, POC neg     Yeast, UA neg    POCT URINALYSIS DIPSTICK      Result Value Range   Color, UA yellow     Clarity, UA clear     Glucose, UA neg     Bilirubin, UA small     Ketones, UA 80     Spec Grav, UA 1.020     Blood, UA neg     pH, UA 5.5     Protein, UA 30     Urobilinogen, UA 0.2     Nitrite, UA neg     Leukocytes, UA Negative    POCT CBC      Result Value Range   WBC 8.7  4.6 - 10.2 K/uL   Lymph, poc 2.6  0.6 - 3.4   POC LYMPH PERCENT 29.4  10 - 50 %L   MID (cbc) 0.5  0 - 0.9   POC  MID % 6.3  0 - 12 %M   POC Granulocyte 5.6  2 - 6.9   Granulocyte percent 64.3  37 - 80 %G   RBC 4.91  4.04 - 5.48 M/uL   Hemoglobin 13.9  12.2 - 16.2 g/dL   HCT, POC 91.444.3  78.237.7 - 47.9 %   MCV 90.3  80 - 97 fL   MCH, POC 28.3  27 - 31.2 pg   MCHC 31.4 (*) 31.8 - 35.4 g/dL   RDW, POC 95.621.5     Platelet Count, POC 161  142 - 424 K/uL   MPV 12.8  0 - 99.8 fL    Assessment & Plan:   I have completed the patient encounter in its entirety as documented by the scribe, with editing by me where necessary. Ginger Leeth P. Merla Richesoolittle, M.D. Abdominal pain, right upper quadrant - Status post gastric bypass for obesity -  Metabolic profile with normal liver function studies/CBC normal  Abdominal ultrasound attention right upper quadrant Will be next

## 2013-05-05 ENCOUNTER — Telehealth: Payer: Self-pay

## 2013-05-05 NOTE — Telephone Encounter (Signed)
Dr Merla Richesoolittle please review labs.

## 2013-05-05 NOTE — Telephone Encounter (Signed)
Spoke with pt, advised labs normal. Pt understood and will await ultrasound appt.

## 2013-05-05 NOTE — Telephone Encounter (Signed)
Pt was seen Saturday and wants to know if her lab results are available. [phone]

## 2013-05-05 NOTE — Telephone Encounter (Signed)
Told lab to call   All ok.ultrasound being sched.

## 2013-05-16 ENCOUNTER — Ambulatory Visit
Admission: RE | Admit: 2013-05-16 | Discharge: 2013-05-16 | Disposition: A | Discharge status: Home / Self Care | Payer: Federal, State, Local not specified - PPO | Source: Ambulatory Visit | Attending: Internal Medicine | Admitting: Internal Medicine

## 2013-05-16 DIAGNOSIS — R1011 Right upper quadrant pain: Secondary | ICD-10-CM

## 2013-05-23 ENCOUNTER — Telehealth: Payer: Self-pay | Admitting: Obstetrics & Gynecology

## 2013-05-23 NOTE — Telephone Encounter (Signed)
Spoke with patient. She states she has been having increased nausea in the mornings. States she is having hot flashes as well, but that nausea is main concern. She has been taking Phenergan 12.5 that was prescribed by [facility] Dr[facility]. She is feeling like nausea and hot flashes are related to menopausal symptoms as she states "Usually I just think it's because of my surgery but I am not very sure." She states that nothing makes the nausea worse, just morning times symptoms appear and then she takes phenergan and feels better. Food/fluids do not worsen symptoms. Patient declines office visit to discuss. She is requesting a refill of phenergan from Dr. Hyacinth MeekerMiller. Patient also has a new u/s with dx of Gallstones, but did not discuss with patient as it does not appear that she has been contacted regarding follow up for the u/s as of yet.

## 2013-05-23 NOTE — Telephone Encounter (Signed)
Patient said she has been having nausea and hot flashes she wanted to check and see if it was related to menopause. Has been going on for about a month.

## 2013-05-23 NOTE — Telephone Encounter (Signed)
Patient with hx Gall Stones, recent diagnosis.  Hx Hysterectomy.   Message left to return call to [place] at [phone].

## 2013-05-24 ENCOUNTER — Other Ambulatory Visit: Payer: Self-pay | Admitting: Obstetrics & Gynecology

## 2013-05-24 MED ORDER — PROMETHAZINE HCL 12.5 MG PO TABS: 12.5000 mg | ORAL_TABLET | Freq: Three times a day (TID) | ORAL | Status: DC | PRN

## 2013-05-24 NOTE — Telephone Encounter (Signed)
Rx done.  Pt notified.  Encounter closed.

## 2013-05-27 ENCOUNTER — Telehealth: Payer: Self-pay | Admitting: Obstetrics & Gynecology

## 2013-05-27 NOTE — Telephone Encounter (Signed)
Pt wanting to see Dr Hyacinth MeekerMiller to discuss getting started on hrt medication.

## 2013-05-27 NOTE — Telephone Encounter (Signed)
Spoke with pt who is interested in starting HRT for hot flashes, emotional mood swings, and being very tearful. Sched appt 05-31-13 at 2:30 with SM. Pt appreciative.

## 2013-05-27 NOTE — Telephone Encounter (Signed)
LMTCB  (Appt 05-31-13 at 2:30 with SM?)

## 2013-05-27 NOTE — Telephone Encounter (Signed)
Patient is calling Amy back.

## 2013-05-31 ENCOUNTER — Ambulatory Visit (INDEPENDENT_AMBULATORY_CARE_PROVIDER_SITE_OTHER): Payer: Federal, State, Local not specified - PPO | Admitting: Obstetrics & Gynecology

## 2013-05-31 ENCOUNTER — Encounter: Payer: Self-pay | Admitting: Obstetrics & Gynecology

## 2013-05-31 VITALS — BP 127/79 | HR 91 | Resp 16 | Wt 144.0 lb

## 2013-05-31 DIAGNOSIS — N951 Menopausal and female climacteric states: Secondary | ICD-10-CM

## 2013-05-31 MED ORDER — ESTRADIOL 0.075 MG/24HR TD PTTW: 1.0000 | MEDICATED_PATCH | TRANSDERMAL | Status: DC

## 2013-05-31 NOTE — Progress Notes (Signed)
53 y.o. Married Caucasian female G2P0011 here to discuss issues with hot flashes, night sweats, and sleep disturbance.  She underwent a gastric by-pass in 2014.  Patient has had almost 80 pounds.  Surgery was complicated by leaking at the anastomosis site.  She ended up having repeat surgery.  Patient's weight loss has been much faster than typical gastric bypass patients.  She's had hair loss as well but thinks this has stabilized.  She does have some early hair regrowth.   Stopped her HRT last year with all the other issues that were going on.  Saw Dr. Edward JollySilva for [facility] appt 1/15.   Reports she's been having more emotional issues over the last few months.  Really felt that was due to the issues with bariatric surgery.  That is all better now and so now she really feels it is related to hot flashes and the change in HRT.  She saw PCP last week.  She was started on Lexapro.  She is now wondering if stopping the HRT is contributing.    Symptoms started back in September or October but really getting tired of it all.  As she has been off HRT for several months, discussed with pt risks including DVT/PE, MI, stroke, breast cancer.  She says she doesn't care as she feels so terrible she'd do just about anything to feel better.  No suicidal or homicidal ideation.  O: Healthy WD,WN female Affect: normal   A: Vasomotor symptoms  P: Vivelle dot 0.075mg  patches.  One to skin twice weekly.  Rx to pharmacy.  Pt to start today.  F/U 4 weeks.  ~15 minutes spent with patient >50% of time was in face to face discussion of above.

## 2013-06-19 NOTE — Telephone Encounter (Signed)
Patient is calling asking to move her 6 week appt on 07/15/13 to mid may is this okay?

## 2013-06-19 NOTE — Telephone Encounter (Signed)
Message left to return call to Melissa Pitts at [phone].    

## 2013-06-25 NOTE — Telephone Encounter (Signed)
Patient has moved her appointment to 4/13 at 1:45 with reception. Okay to close encounter?

## 2013-06-26 NOTE — Telephone Encounter (Signed)
Yes.  Encounter closed. 

## 2013-07-12 ENCOUNTER — Encounter: Payer: Self-pay | Admitting: Obstetrics & Gynecology

## 2013-07-15 ENCOUNTER — Ambulatory Visit (INDEPENDENT_AMBULATORY_CARE_PROVIDER_SITE_OTHER): Payer: Federal, State, Local not specified - PPO | Admitting: Obstetrics & Gynecology

## 2013-07-15 ENCOUNTER — Encounter: Payer: Self-pay | Admitting: Obstetrics & Gynecology

## 2013-07-15 VITALS — BP 116/72 | HR 68 | Resp 16 | Ht 62.75 in | Wt 141.8 lb

## 2013-07-15 DIAGNOSIS — N951 Menopausal and female climacteric states: Secondary | ICD-10-CM

## 2013-07-15 MED ORDER — ESTRADIOL 0.075 MG/24HR TD PTTW: 1.0000 | MEDICATED_PATCH | TRANSDERMAL | Status: DC

## 2013-07-15 NOTE — Progress Notes (Signed)
Patient ID: Melissa Pitts, female   DOB: 08/04/1960, 53 y.o.   MRN: [id number]  53 yo G2P1 MWF here for follow after starting HRT.  Doing much better.  Hot flashes are gone and sleep is much better.  Biggest improvement is with crying and mood.  She states she feels like a different person and is really pleased.  Has not tried to be sexually active yet.  Was also having a lot of vaginal dryness and pain with intercourse.  Pt has not gotten MMG yet.  Knows I will not refill medication at [facility] if she has not gotten MMG.  Does not want me to schedule.  Desires to do this on her own.  [facility].    Advised, with how quickly she improved, she may want to decrease estrogen a little.  She is very scared about doing this as she felt so badly before starting HRT.  Advised we can reassess at [facility] but if she desires to do so, just cut 1/3 off the patch and this will make about 0.05mg  dosage.    O:  WNWD WF, NAD Affect:  Normal  Assessment:  Vasomotor symptoms, much improved H/O cystic breast mass.  Family hx of breast cancer in [place] and she thinks her mother but there is some uncertainty about her mother's diagnosis.  Plan: Continue Vivelle dot 0.075mg  patches twice weekly.  Rx to pharmacy. Had surgical consult with Dr. Jamey RipaStreck.  F/U was due 9/14.  KNOWS overdue.  States she WILL call and schedule.  Placed reminder for myself for the summer.  If not done, will call patient again.  ~15 minutes spent with patient >50% of time was in face to face discussion of above.

## 2013-08-02 HISTORY — PX: HERNIA REPAIR: SHX51

## 2013-08-26 ENCOUNTER — Emergency Department (HOSPITAL_COMMUNITY): Payer: Federal, State, Local not specified - PPO

## 2013-08-26 ENCOUNTER — Emergency Department (HOSPITAL_COMMUNITY)
Admission: EM | Admit: 2013-08-26 | Discharge: 2013-08-27 | Disposition: A | Discharge status: Other Acute Inpatient Hospital | Payer: Federal, State, Local not specified - PPO | Attending: Emergency Medicine | Admitting: Emergency Medicine

## 2013-08-26 ENCOUNTER — Encounter (HOSPITAL_COMMUNITY): Payer: Self-pay | Admitting: Emergency Medicine

## 2013-08-26 DIAGNOSIS — F3289 Other specified depressive episodes: Secondary | ICD-10-CM | POA: Insufficient documentation

## 2013-08-26 DIAGNOSIS — Z9884 Bariatric surgery status: Secondary | ICD-10-CM | POA: Insufficient documentation

## 2013-08-26 DIAGNOSIS — E039 Hypothyroidism, unspecified: Secondary | ICD-10-CM | POA: Insufficient documentation

## 2013-08-26 DIAGNOSIS — Z8742 Personal history of other diseases of the female genital tract: Secondary | ICD-10-CM | POA: Insufficient documentation

## 2013-08-26 DIAGNOSIS — G4730 Sleep apnea, unspecified: Secondary | ICD-10-CM | POA: Insufficient documentation

## 2013-08-26 DIAGNOSIS — I10 Essential (primary) hypertension: Secondary | ICD-10-CM | POA: Insufficient documentation

## 2013-08-26 DIAGNOSIS — K56609 Unspecified intestinal obstruction, unspecified as to partial versus complete obstruction: Secondary | ICD-10-CM

## 2013-08-26 DIAGNOSIS — Z880 Allergy status to penicillin: Secondary | ICD-10-CM | POA: Insufficient documentation

## 2013-08-26 DIAGNOSIS — F329 Major depressive disorder, single episode, unspecified: Secondary | ICD-10-CM | POA: Insufficient documentation

## 2013-08-26 DIAGNOSIS — Z9981 Dependence on supplemental oxygen: Secondary | ICD-10-CM | POA: Insufficient documentation

## 2013-08-26 DIAGNOSIS — Z79899 Other long term (current) drug therapy: Secondary | ICD-10-CM | POA: Insufficient documentation

## 2013-08-26 DIAGNOSIS — E119 Type 2 diabetes mellitus without complications: Secondary | ICD-10-CM | POA: Insufficient documentation

## 2013-08-26 LAB — CBC WITH DIFFERENTIAL/PLATELET
Basophils Absolute: 0 10*3/uL (ref 0.0–0.1)
Basophils Relative: 1 % (ref 0–1)
Eosinophils Absolute: 0.1 10*3/uL (ref 0.0–0.7)
Eosinophils Relative: 1 % (ref 0–5)
HCT: 38.1 % (ref 36.0–46.0)
Hemoglobin: 12.8 g/dL (ref 12.0–15.0)
Lymphocytes Relative: 34 % (ref 12–46)
Lymphs Abs: 2.9 10*3/uL (ref 0.7–4.0)
MCH: 31.4 pg (ref 26.0–34.0)
MCHC: 33.6 g/dL (ref 30.0–36.0)
MCV: 93.4 fL (ref 78.0–100.0)
Monocytes Absolute: 0.8 10*3/uL (ref 0.1–1.0)
Monocytes Relative: 10 % (ref 3–12)
Neutro Abs: 4.6 10*3/uL (ref 1.7–7.7)
Neutrophils Relative %: 54 % (ref 43–77)
Platelets: 201 10*3/uL (ref 150–400)
RBC: 4.08 MIL/uL (ref 3.87–5.11)
RDW: 13.3 % (ref 11.5–15.5)
WBC: 8.3 10*3/uL (ref 4.0–10.5)

## 2013-08-26 LAB — COMPREHENSIVE METABOLIC PANEL
ALT: 27 U/L (ref 0–35)
AST: 18 U/L (ref 0–37)
Albumin: 4 g/dL (ref 3.5–5.2)
Alkaline Phosphatase: 88 U/L (ref 39–117)
BUN: 22 mg/dL (ref 6–23)
CO2: 28 mEq/L (ref 19–32)
Calcium: 9.5 mg/dL (ref 8.4–10.5)
Chloride: 99 mEq/L (ref 96–112)
Creatinine, Ser: 0.71 mg/dL (ref 0.50–1.10)
GFR calc Af Amer: >90 mL/min (ref >90)
GFR calc non Af Amer: >90 mL/min (ref >90)
Glucose, Bld: 132 mg/dL — ABNORMAL HIGH (ref 70–99)
Potassium: 3.6 mEq/L — ABNORMAL LOW (ref 3.7–5.3)
Sodium: 139 mEq/L (ref 137–147)
Total Bilirubin: 0.5 mg/dL (ref 0.3–1.2)
Total Protein: 7 g/dL (ref 6.0–8.3)

## 2013-08-26 LAB — LACTIC ACID, PLASMA: Lactic Acid, Venous: 1.5 mmol/L (ref 0.5–2.2)

## 2013-08-26 LAB — LIPASE, BLOOD: Lipase: 32 U/L (ref 11–59)

## 2013-08-26 MED ORDER — IOHEXOL 300 MG/ML  SOLN
25.0000 mL | Freq: Once | INTRAMUSCULAR | Status: AC | PRN
  Administered 2013-08-26: 25 mL via ORAL

## 2013-08-26 MED ORDER — IOHEXOL 300 MG/ML  SOLN
100.0000 mL | Freq: Once | INTRAMUSCULAR | Status: AC | PRN
  Administered 2013-08-26: 100 mL via INTRAVENOUS

## 2013-08-26 MED ORDER — MORPHINE SULFATE 4 MG/ML IJ SOLN
4.0000 mg | Freq: Once | INTRAMUSCULAR | Status: AC
  Administered 2013-08-26: 4 mg via INTRAVENOUS
  Filled 2013-08-26: qty 1

## 2013-08-26 MED ORDER — HYDROMORPHONE HCL PF 1 MG/ML IJ SOLN
0.5000 mg | Freq: Once | INTRAMUSCULAR | Status: AC
  Administered 2013-08-27: 0.5 mg via INTRAVENOUS
  Filled 2013-08-26: qty 1

## 2013-08-26 MED ORDER — ONDANSETRON HCL 4 MG/2ML IJ SOLN
4.0000 mg | Freq: Once | INTRAMUSCULAR | Status: AC
  Administered 2013-08-26: 4 mg via INTRAVENOUS
  Filled 2013-08-26: qty 2

## 2013-08-26 MED ORDER — SODIUM CHLORIDE 0.9 % IJ SOLN
INTRAMUSCULAR | Status: AC
  Filled 2013-08-26: qty 500

## 2013-08-26 MED ORDER — SODIUM CHLORIDE 0.9 % IV SOLN
INTRAVENOUS | Status: DC
  Administered 2013-08-26: 21:00:00 via INTRAVENOUS

## 2013-08-26 NOTE — ED Notes (Signed)
abd pain since Saturday.  No NVD,  Last bm on Saturday.   Hx of gastric bypass 9 mos ago

## 2013-08-26 NOTE — ED Provider Notes (Signed)
CSN: [id number]     Arrival date & time 08/26/13  1847 History   First MD Initiated Contact with Patient 08/26/13 1958  This chart was scribed for Gilda Crease, * by Valera Castle, ED Scribe. This patient was seen in room APA02/APA02 and the patient's care was started at 8:02 PM.    Chief Complaint  Patient presents with  . Abdominal Pain   (Consider location/radiation/quality/duration/timing/severity/associated sxs/prior Treatment) The history is provided by the patient. No language interpreter was used.   HPI Comments: Melissa Pitts is a 53 y.o. female with h/o gastric bypass 9 months ago at [facility], who presents to the Emergency Department complaining of periumbilical and lower abdominal pain, onset 2 days ago. She reports her pain feels like gas. She reports her last bowel movement was 2 days ago. She reports not feeling bad today until she ate something. She reports h/o kidney infection, but states opain was mostly in her back. She denies h/o acid reflex, colitis, kidney stones. She denies nausea, vomiting, diarrhea, and any other associated symptoms.   PCP - Colette Ribas, MD  Past Medical History  Diagnosis Date  . Allergy   . Depression   . Diabetes mellitus   . Hypertension   . Thyroid disease     hypothyroid  . Sleep apnea     wears CPAP  . Breast mass    Past Surgical History  Procedure Laterality Date  . Gastric bypass  11/14/12  . Bypass leak repair  11/21/12  . Vaginal hysterectomy  8/07   Family History  Problem Relation Age of Onset  . Colon cancer Neg Hx   . Esophageal cancer Neg Hx   . Rectal cancer Neg Hx   . Stomach cancer Neg Hx   . Hypertension Mother   . Breast cancer Mother   . Cancer Maternal Grandmother     Breast  . Cancer Father   . Heart disease Father     bypass  . Diabetes Father   . Diabetes Paternal Grandfather    History  Substance Use Topics  . Smoking status: Never Smoker   . Smokeless tobacco: Never Used  .  Alcohol Use: No   OB History   Grav Para Term Preterm Abortions TAB SAB Ect Mult Living   2 1   1  1   1      Review of Systems  Constitutional: Negative for fever.  Gastrointestinal: Positive for abdominal pain. Negative for nausea, vomiting, diarrhea and constipation.  All other systems reviewed and are negative.  Allergies  Sulfamethoxazole; Penicillins; and Sulfur  Home Medications   Prior to Admission medications   Medication Sig Start Date End Date Taking? Authorizing Provider  escitalopram (LEXAPRO) 20 MG tablet daily.  05/29/13   Historical Provider, MD  estradiol (VIVELLE-DOT) 0.075 MG/24HR Place 1 patch onto the skin 2 (two) times a week. 07/15/13   Annamaria Boots, MD  levothyroxine (SYNTHROID, LEVOTHROID) 50 MCG tablet 1 tablet Daily. 03/07/11   Historical Provider, MD  promethazine (PHENERGAN) 12.5 MG tablet Take 1 tablet (12.5 mg total) by mouth every 8 (eight) hours as needed for nausea or vomiting. 05/24/13   Annamaria Boots, MD   BP 149/82  Pulse 69  Temp(Src) 98.8 F (37.1 C) (Oral)  Resp 20  Ht 5\' 3"  (1.6 m)  Wt 140 lb (63.504 kg)  BMI 24.81 kg/m2  SpO2 100%  LMP 04/04/2005  Physical Exam  Constitutional: She is oriented to person, place, and  time. She appears well-developed and well-nourished. No distress.  HENT:  Head: Normocephalic and atraumatic.  Right Ear: Hearing normal.  Left Ear: Hearing normal.  Mouth/Throat: Mucous membranes are normal.  Eyes: Conjunctivae and EOM are normal.  Neck: Neck supple.  Cardiovascular: Normal rate, S1 normal and S2 normal.   Pulmonary/Chest: Effort normal. No respiratory distress. She exhibits no tenderness.  Abdominal: Soft. Normal appearance and bowel sounds are normal. There is no hepatosplenomegaly. There is tenderness (diffuse). There is no rebound, no guarding, no tenderness at McBurney's point and negative Murphy's sign. No hernia.  Musculoskeletal: Normal range of motion.  Neurological: She is alert  and oriented to person, place, and time. She has normal strength. No cranial nerve deficit or sensory deficit. Coordination normal. GCS eye subscore is 4. GCS verbal subscore is 5. GCS motor subscore is 6.  Skin: Skin is warm, dry and intact. No rash noted. No cyanosis.  Psychiatric: She has a normal mood and affect. Her speech is normal and behavior is normal. Thought content normal.    ED Course  Procedures (including critical care time) DIAGNOSTIC STUDIES: Oxygen Saturation is 100% on room air, normal by my interpretation.    COORDINATION OF CARE: 8:05 PM-Discussed treatment plan which includes CT abdomen with pt at bedside and pt agreed to plan.   Results for orders placed during the hospital encounter of 08/26/13  CBC WITH DIFFERENTIAL      Result Value Ref Range   WBC 8.3  4.0 - 10.5 K/uL   RBC 4.08  3.87 - 5.11 MIL/uL   Hemoglobin 12.8  12.0 - 15.0 g/dL   HCT 16.1  09.6 - 04.5 %   MCV 93.4  78.0 - 100.0 fL   MCH 31.4  26.0 - 34.0 pg   MCHC 33.6  30.0 - 36.0 g/dL   RDW 40.9  81.1 - 91.4 %   Platelets 201  150 - 400 K/uL   Neutrophils Relative % 54  43 - 77 %   Neutro Abs 4.6  1.7 - 7.7 K/uL   Lymphocytes Relative 34  12 - 46 %   Lymphs Abs 2.9  0.7 - 4.0 K/uL   Monocytes Relative 10  3 - 12 %   Monocytes Absolute 0.8  0.1 - 1.0 K/uL   Eosinophils Relative 1  0 - 5 %   Eosinophils Absolute 0.1  0.0 - 0.7 K/uL   Basophils Relative 1  0 - 1 %   Basophils Absolute 0.0  0.0 - 0.1 K/uL  COMPREHENSIVE METABOLIC PANEL      Result Value Ref Range   Sodium 139  137 - 147 mEq/L   Potassium 3.6 (*) 3.7 - 5.3 mEq/L   Chloride 99  96 - 112 mEq/L   CO2 28  19 - 32 mEq/L   Glucose, Bld 132 (*) 70 - 99 mg/dL   BUN 22  6 - 23 mg/dL   Creatinine, Ser 7.82  0.50 - 1.10 mg/dL   Calcium 9.5  8.4 - 95.6 mg/dL   Total Protein 7.0  6.0 - 8.3 g/dL   Albumin 4.0  3.5 - 5.2 g/dL   AST 18  0 - 37 U/L   ALT 27  0 - 35 U/L   Alkaline Phosphatase 88  39 - 117 U/L   Total Bilirubin 0.5  0.3 -  1.2 mg/dL   GFR calc non Af Amer >90  >90 mL/min   GFR calc Af Amer >90  >90 mL/min  LIPASE,  BLOOD      Result Value Ref Range   Lipase 32  11 - 59 U/L  LACTIC ACID, PLASMA      Result Value Ref Range   Lactic Acid, Venous 1.5  0.5 - 2.2 mmol/L   Ct Abdomen Pelvis W Contrast  08/26/2013   CLINICAL DATA:  Abdominal pain for 2 days, vomiting, gastric bypass surgery 9 months ago, past history hypertension and diabetes  EXAM: CT ABDOMEN AND PELVIS WITH CONTRAST  TECHNIQUE: Multidetector CT imaging of the abdomen and pelvis was performed using the standard protocol following bolus administration of intravenous contrast. Sagittal and coronal MPR images reconstructed from axial data set.  CONTRAST:  75mL OMNIPAQUE IOHEXOL 300 MG/ML SOLN, OMNIPAQUE IOHEXOL 300 MG/ML SOLN  COMPARISON:  None.  FINDINGS: Lung bases clear.  Large gallstone in gallbladder 2.6 x 2.0 cm.  No gross evidence of gallbladder wall thickening by CT.  Liver, pancreas, kidneys, and adrenal glands normal appearance.  Spleen is normal appearance though a small amount of fluid is seen adjacent to spleen under LEFT hemidiaphragm.  Prior gastric bypass surgery with patent anastomosis, GI contrast exiting stomach into proximal jejunal loops.  Dilated proximal jejunal loops extend to Roux-en-Y anastomosis which demonstrates a questionable wall thickening.  Small bowel loops distal to the anastomosis are normal in caliber, as is colon.  No mass, adenopathy, free air or hernia.  Minimal free pelvic fluid.  Unremarkable bladder and ureters.  Post hysterectomy with normal sized ovaries.  No acute osseous findings.  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  Minimal free fluid perisplenic and in pelvis.   Electronically Signed   By: Ulyses Southward M.D.   On: 08/26/2013 22:28     EKG  Interpretation None     Medications  0.9 %  sodium chloride infusion ( Intravenous New Bag/Given 08/26/13 2035)  sodium chloride 0.9 % injection (not administered)  morphine 4 MG/ML injection 4 mg (4 mg Intravenous Given 08/26/13 2030)  ondansetron (ZOFRAN) injection 4 mg (4 mg Intravenous Given 08/26/13 2030)  iohexol (OMNIPAQUE) 300 MG/ML solution 25 mL (25 mLs Oral Contrast Given 08/26/13 2041)  ondansetron (ZOFRAN) injection 4 mg (4 mg Intravenous Given 08/26/13 2129)  morphine 4 MG/ML injection 4 mg (4 mg Intravenous Given 08/26/13 2129)  iohexol (OMNIPAQUE) 300 MG/ML solution 100 mL (100 mLs Intravenous Contrast Given 08/26/13 2156)   MDM   Final diagnoses:  None    Patient presents to the ER for evaluation of upper abdominal pain with nausea and vomiting. Patient is 9 months postop from bariatric surgery at [facility]. Patient's surgeon is Doctor Karsten Fells at [facility].  The patient reports that she had onset of cramping abdominal pain 2 days ago. She did have a normal bowel movement that day, but since then no further bowel movements. She has been passing some gas, but has had progressively worsening pain, now severe. Patient has had nausea without vomiting prior to coming to the ER, has now had vomiting here in the ER.   Lab work is entirely normal. CT scan was performed. Patient's CT scan shows dilated jejunum extending from patent gastrojejunostomy anastomosis to the Roux-en-Y anastomosis where mild wall thickening of the bowel is identified, possible inflammation and obstruction.  Case discussed with Doctor McNatt. Patient has been accepted for transfer to [facility] for management.  I personally performed the services  described in this documentation, which was scribed in my presence. The recorded information has been reviewed and is accurate.     Gilda Creasehristopher J. Pollina, MD 08/26/13 2329

## 2013-08-27 MED ORDER — HYDROMORPHONE HCL PF 1 MG/ML IJ SOLN
INTRAMUSCULAR | Status: AC
  Filled 2013-08-27: qty 1

## 2013-08-27 MED ORDER — HYDROMORPHONE HCL PF 1 MG/ML IJ SOLN
0.5000 mg | Freq: Once | INTRAMUSCULAR | Status: AC
  Administered 2013-08-27: 0.5 mg via INTRAVENOUS

## 2013-10-10 ENCOUNTER — Telehealth: Payer: Self-pay

## 2013-10-10 NOTE — Telephone Encounter (Signed)
Lmtcb//kn 

## 2013-10-10 NOTE — Telephone Encounter (Signed)
Message copied by Elisha HeadlandNIX, Zayaan Kozak S on Thu Oct 10, 2013 10:07 AM ------      Message from: Jerene BearsMILLER, MARY S      Created: Tue Oct 08, 2013  5:38 PM      Regarding: Needs MMG       Pt overdue for MMG.  I told her we would call and bug her if she didn't do it.  She had a small bowel obstruction from ER notes in May and [facility].  So, she may have a lot going on BUT it has been since 2012.  Can you call her?  Thanks.            MSM ------

## 2013-10-14 NOTE — Telephone Encounter (Signed)
Patient returning Kelly's call. °

## 2013-10-14 NOTE — Telephone Encounter (Signed)
lmtcb on mobile #//kn

## 2013-10-14 NOTE — Telephone Encounter (Signed)
Returning call.

## 2013-10-18 NOTE — Telephone Encounter (Signed)
Thank you. Encounter closed. 

## 2013-10-18 NOTE — Telephone Encounter (Signed)
Spoke with patient-states she has MMG scheduled for 10/28/13//kn

## 2014-02-03 ENCOUNTER — Encounter (HOSPITAL_COMMUNITY): Payer: Self-pay | Admitting: Emergency Medicine

## 2014-05-01 ENCOUNTER — Encounter: Payer: Self-pay | Admitting: Obstetrics & Gynecology

## 2014-05-01 ENCOUNTER — Ambulatory Visit (INDEPENDENT_AMBULATORY_CARE_PROVIDER_SITE_OTHER): Payer: Federal, State, Local not specified - PPO | Admitting: Obstetrics & Gynecology

## 2014-05-01 VITALS — BP 132/72 | HR 72 | Resp 16 | Ht 63.0 in | Wt 152.4 lb

## 2014-05-01 DIAGNOSIS — Z01419 Encounter for gynecological examination (general) (routine) without abnormal findings: Secondary | ICD-10-CM

## 2014-05-01 MED ORDER — ESTRADIOL 0.075 MG/24HR TD PTTW: 1.0000 | MEDICATED_PATCH | TRANSDERMAL | Status: DC

## 2014-05-01 NOTE — Progress Notes (Signed)
54 y.o. [id number] MarriedCaucasianF here for annual exam.  Had SBO last year due to incarcerated hernia.  Not sure if this was related to Gastric bypass in 2014.  Doing well.  Up 7#s.  Really doing well from this point.    PCP:  Dr. Phillips OdorGolding.  Blood work was normal in Dec.  On Vit D.    Patient's last menstrual period was 04/04/2005.          Sexually active: Yes.    The current method of family planning is status post hysterectomy.    Exercising: Yes.    walking Smoker:  no  Health Maintenance: Pap:  12/14/07 WNL  History of abnormal Pap:  no MMG:  10/28/13-normal Colonoscopy:  2011-repeat in 10 years BMD:   none TDaP:  03/16/11 Screening Labs: PCP, Hb today: PCP, Urine today: PCP   reports that she has never smoked. She has never used smokeless tobacco. She reports that she does not drink alcohol or use illicit drugs.  Past Medical History  Diagnosis Date  . Allergy   . Depression   . Diabetes mellitus   . Hypertension   . Thyroid disease     hypothyroid  . Sleep apnea     wears CPAP  . Breast mass     Past Surgical History  Procedure Laterality Date  . Gastric bypass  11/14/12  . Bypass leak repair  11/21/12  . Vaginal hysterectomy  8/07  . Hernia repair  5/15    Current Outpatient Prescriptions  Medication Sig Dispense Refill  . CALCIUM PO Take 1 tablet by mouth 2 (two) times daily.    . Cyanocobalamin (B-12 SL) Place 1 tablet under the tongue daily.    Marland Kitchen. escitalopram (LEXAPRO) 20 MG tablet Take 20 mg by mouth at bedtime.     Marland Kitchen. estradiol (VIVELLE-DOT) 0.075 MG/24HR Place 1 patch onto the skin 2 (two) times a week. 8 patch 12  . ferrous sulfate 325 (65 FE) MG EC tablet Take 1 tablet by mouth daily.    Marland Kitchen. levothyroxine (SYNTHROID, LEVOTHROID) 50 MCG tablet Take 1 tablet by mouth every evening.     . Multiple Vitamin (MULTIVITAMIN WITH MINERALS) TABS tablet Take 1 tablet by mouth daily.    . Vitamin D, Ergocalciferol, (DRISDOL) 50000 UNITS CAPS capsule Take 50,000 Units by  mouth once a week. X 6 weeks  0  . promethazine (PHENERGAN) 12.5 MG tablet As needed    . Simethicone (GAS-X PO) Take 1 tablet by mouth once as needed.     No current facility-administered medications for this visit.    Family History  Problem Relation Age of Onset  . Colon cancer Neg Hx   . Esophageal cancer Neg Hx   . Rectal cancer Neg Hx   . Stomach cancer Neg Hx   . Hypertension Mother   . Breast cancer Mother   . Cancer Maternal Grandmother     Breast  . Cancer Father   . Heart disease Father     bypass  . Diabetes Father   . Diabetes Paternal Grandfather     ROS:  Pertinent items are noted in HPI.  Otherwise, a comprehensive ROS was negative.  Exam:   BP 132/72 mmHg  Pulse 72  Resp 16  Ht 5\' 3"  (1.6 m)  Wt 152 lb 6.4 oz (69.128 kg)  BMI 27.00 kg/m2  LMP 04/04/2005  Weight change: +7#   Height: 5\' 3"  (160 cm)  Ht Readings from Last 3 Encounters:  05/01/14  (1.6 m)  08/26/13  (1.6 m)  07/15/13 5' 2.75" (1.594 m)    General appearance: alert, cooperative and appears stated age Head: Normocephalic, without obvious abnormality, atraumatic Neck: no adenopathy, supple, symmetrical, trachea midline and thyroid normal to inspection and palpation Lungs: clear to auscultation bilaterally Breasts: normal appearance, no masses or tenderness Heart: regular rate and rhythm Abdomen: soft, non-tender; bowel sounds normal; no masses,  no organomegaly Extremities: extremities normal, atraumatic, no cyanosis or edema Skin: Skin color, texture, turgor normal. No rashes or lesions Lymph nodes: Cervical, supraclavicular, and axillary nodes normal. No abnormal inguinal nodes palpated Neurologic: Grossly normal   Pelvic: External genitalia:  no lesions              Urethra:  normal appearing urethra with no masses, tenderness or lesions              Bartholins and Skenes: normal                 Vagina: normal appearing vagina with normal color and discharge, no  lesions              Cervix: absent              Pap taken: No. Bimanual Exam:  Uterus:  uterus absent              Adnexa: normal adnexa and no mass, fullness, tenderness               Rectovaginal: Confirms               Anus:  normal sphincter tone, no lesions  Chaperone was present for exam.  A:  Normal gynecologic exam H/O [facility] 8/07 On HRT S/p gastric bypass 2014 H/o incarcerated hernia, SBO, 5/15.   Family history of breast cancer - mother and maternal grandmother.   P:  Mammogram yearly recommended due to HRT and family hx of breast cancer.   Pap not obtained.  Not indicated. Plan BMD around 58. Pt clearly aware of HRT risks including breast cancer and desires to continue with estradiol patch.  rx for 0.075mg  twice weekly to pharmacy.  Generic ok per pt.  She will let me know if has any troubvl with this.   Return annually or prn

## 2015-07-03 ENCOUNTER — Encounter: Payer: Self-pay | Admitting: Obstetrics & Gynecology

## 2015-07-03 ENCOUNTER — Ambulatory Visit (INDEPENDENT_AMBULATORY_CARE_PROVIDER_SITE_OTHER): Payer: Federal, State, Local not specified - PPO | Admitting: Obstetrics & Gynecology

## 2015-07-03 DIAGNOSIS — Z01419 Encounter for gynecological examination (general) (routine) without abnormal findings: Secondary | ICD-10-CM | POA: Diagnosis not present

## 2015-07-03 MED ORDER — ESTRADIOL 0.075 MG/24HR TD PTTW: 1.0000 | MEDICATED_PATCH | TRANSDERMAL | Status: DC

## 2015-07-03 MED ORDER — PROMETHAZINE HCL 12.5 MG PO TABS: 12.5000 mg | ORAL_TABLET | Freq: Three times a day (TID) | ORAL | Status: DC | PRN

## 2015-07-03 NOTE — Progress Notes (Signed)
Patient ID: Melissa QuarryJanet R Crabtree, female   DOB: 08/21/1960, 55 y.o.   MRN: [id number]   55 y.o. [id number] MarriedCaucasianF here for annual exam.  Doing well.  No vaginal bleeding.     Pt asks for phenergan rx.  She uses this very rarely.  States this is her "security blanket" about going out to eat and being social.    PCP:  Dr. Phillips OdorGolding.  Sees him every six months.    Patient's last menstrual period was 04/04/2005.          Sexually active: Yes.    The current method of family planning is status post hysterectomy.    Exercising: Yes.    walking. Smoker:  no  Health Maintenance: Pap:  12/2007 Neg History of abnormal Pap:  no MMG:  03-04-15 Density Cat.B/scattered fibroglandular density/Neg/BiRads2:[facility] Colonoscopy:  04-20-11 incomplete;normal to ascending colon--unable to reach cecum with Dr. Juanda ChanceBrodie.  Had normal barium enema 04-22-11;next due 04/2021. BMD:   never TDaP:  03-16-11 Screening Labs: no, Hb today: PCP, Urine today: PCP   reports that she has never smoked. She has never used smokeless tobacco. She reports that she does not drink alcohol or use illicit drugs.  Past Medical History  Diagnosis Date  . Allergy   . Depression   . Diabetes mellitus   . Hypertension   . Thyroid disease     hypothyroid  . Sleep apnea     wears CPAP  . Breast mass     Past Surgical History  Procedure Laterality Date  . Gastric bypass  11/14/12  . Bypass leak repair  11/21/12  . Vaginal hysterectomy  8/07  . Hernia repair  5/15    Family History  Problem Relation Age of Onset  . Colon cancer Neg Hx   . Esophageal cancer Neg Hx   . Rectal cancer Neg Hx   . Stomach cancer Neg Hx   . Hypertension Mother   . Breast cancer Mother   . Cancer Maternal Grandmother     Breast  . Cancer Father   . Heart disease Father     bypass  . Diabetes Father   . Diabetes Paternal Grandfather     ROS:  Pertinent items are noted in HPI.  Otherwise, a comprehensive ROS was negative.  Exam:   General  appearance: alert, cooperative and appears stated age Head: Normocephalic, without obvious abnormality, atraumatic Neck: no adenopathy, supple, symmetrical, trachea midline and thyroid normal to inspection and palpation Lungs: clear to auscultation bilaterally Breasts: normal appearance, no masses or tenderness Heart: regular rate and rhythm Abdomen: soft, non-tender; bowel sounds normal; no masses,  no organomegaly Extremities: extremities normal, atraumatic, no cyanosis or edema Skin: Skin color, texture, turgor normal. No rashes or lesions Lymph nodes: Cervical, supraclavicular, and axillary nodes normal. No abnormal inguinal nodes palpated Neurologic: Grossly normal   Pelvic: External genitalia:  no lesions              Urethra:  normal appearing urethra with no masses, tenderness or lesions              Bartholins and Skenes: normal                 Vagina: normal appearing vagina with normal color and discharge, no lesions              Cervix: absent              Pap taken: No. Bimanual Exam:  Uterus:  uterus  absent              Adnexa: normal adnexa and no mass, fullness, tenderness               Rectovaginal: Confirms               Anus:  normal sphincter tone, no lesions  Chaperone was present for exam.  A:  Normal gyn exam H/O [facility] 8/07 On HRT S/p gastric bypass 2014 H/o incarcerated hernia, SBO, 5/15.  Family history of breast cancer - mother and maternal grandmother.  Pt declines genetic testing at this time.    P: Mammogram yearly recommended due to HRT and family hx of breast cancer.  Pap not obtained. Not indicated. Plan BMD around 58. Pt clearly aware of HRT risks including breast cancer and desires to continue with estradiol patch. rx for 0.075mg  twice weekly to pharmacy. rx for Estradiol 0.075mg  twice weekly.  #8/13 RF Phenergan 12.5mg  prm #30/1RF Return annually or prn

## 2015-07-03 NOTE — Patient Instructions (Signed)
Make sure you have a Hepatitis C test with lab work

## 2015-08-14 DIAGNOSIS — M25532 Pain in left wrist: Secondary | ICD-10-CM | POA: Diagnosis not present

## 2015-12-14 DIAGNOSIS — E039 Hypothyroidism, unspecified: Secondary | ICD-10-CM | POA: Diagnosis not present

## 2015-12-14 DIAGNOSIS — Z9884 Bariatric surgery status: Secondary | ICD-10-CM | POA: Diagnosis not present

## 2015-12-14 DIAGNOSIS — F341 Dysthymic disorder: Secondary | ICD-10-CM | POA: Diagnosis not present

## 2015-12-14 DIAGNOSIS — Z1389 Encounter for screening for other disorder: Secondary | ICD-10-CM | POA: Diagnosis not present

## 2015-12-14 DIAGNOSIS — Z6827 Body mass index (BMI) 27.0-27.9, adult: Secondary | ICD-10-CM | POA: Diagnosis not present

## 2016-02-15 DIAGNOSIS — E039 Hypothyroidism, unspecified: Secondary | ICD-10-CM | POA: Diagnosis not present

## 2016-02-15 DIAGNOSIS — Z9884 Bariatric surgery status: Secondary | ICD-10-CM | POA: Diagnosis not present

## 2016-02-15 DIAGNOSIS — Z6827 Body mass index (BMI) 27.0-27.9, adult: Secondary | ICD-10-CM | POA: Diagnosis not present

## 2016-02-15 DIAGNOSIS — Z23 Encounter for immunization: Secondary | ICD-10-CM | POA: Diagnosis not present

## 2016-02-15 DIAGNOSIS — J019 Acute sinusitis, unspecified: Secondary | ICD-10-CM | POA: Diagnosis not present

## 2016-02-15 DIAGNOSIS — E559 Vitamin D deficiency, unspecified: Secondary | ICD-10-CM | POA: Diagnosis not present

## 2016-02-15 DIAGNOSIS — E441 Mild protein-calorie malnutrition: Secondary | ICD-10-CM | POA: Diagnosis not present

## 2016-02-15 DIAGNOSIS — Z1389 Encounter for screening for other disorder: Secondary | ICD-10-CM | POA: Diagnosis not present

## 2016-03-03 DIAGNOSIS — G44219 Episodic tension-type headache, not intractable: Secondary | ICD-10-CM | POA: Diagnosis not present

## 2016-03-03 DIAGNOSIS — M9901 Segmental and somatic dysfunction of cervical region: Secondary | ICD-10-CM | POA: Diagnosis not present

## 2016-03-03 DIAGNOSIS — M9902 Segmental and somatic dysfunction of thoracic region: Secondary | ICD-10-CM | POA: Diagnosis not present

## 2016-03-03 DIAGNOSIS — M542 Cervicalgia: Secondary | ICD-10-CM | POA: Diagnosis not present

## 2016-03-09 DIAGNOSIS — G44219 Episodic tension-type headache, not intractable: Secondary | ICD-10-CM | POA: Diagnosis not present

## 2016-03-09 DIAGNOSIS — M9902 Segmental and somatic dysfunction of thoracic region: Secondary | ICD-10-CM | POA: Diagnosis not present

## 2016-03-09 DIAGNOSIS — M9901 Segmental and somatic dysfunction of cervical region: Secondary | ICD-10-CM | POA: Diagnosis not present

## 2016-03-09 DIAGNOSIS — M542 Cervicalgia: Secondary | ICD-10-CM | POA: Diagnosis not present

## 2016-04-07 DIAGNOSIS — K080 Exfoliation of teeth due to systemic causes: Secondary | ICD-10-CM | POA: Diagnosis not present

## 2016-07-14 ENCOUNTER — Encounter: Payer: Self-pay | Admitting: Obstetrics & Gynecology

## 2016-07-14 ENCOUNTER — Ambulatory Visit (INDEPENDENT_AMBULATORY_CARE_PROVIDER_SITE_OTHER): Payer: Federal, State, Local not specified - PPO | Admitting: Obstetrics & Gynecology

## 2016-07-14 VITALS — BP 110/78 | HR 80 | Resp 20 | Ht 63.0 in | Wt 165.0 lb

## 2016-07-14 DIAGNOSIS — Z01419 Encounter for gynecological examination (general) (routine) without abnormal findings: Secondary | ICD-10-CM | POA: Diagnosis not present

## 2016-07-14 MED ORDER — PROMETHAZINE HCL 12.5 MG PO TABS: 12.5000 mg | ORAL_TABLET | Freq: Three times a day (TID) | ORAL | 1 refills | Status: DC | PRN

## 2016-07-14 MED ORDER — ESTRADIOL 0.075 MG/24HR TD PTTW: 1.0000 | MEDICATED_PATCH | TRANSDERMAL | 4 refills | Status: DC

## 2016-07-14 NOTE — Progress Notes (Signed)
56 y.o. [id number] MarriedCaucasianF here for annual exam.  Doing well.  No vaginal bleeding.    Patient's last menstrual period was 04/04/2005.          Sexually active: Yes.    The current method of family planning is status post hysterectomy.    Exercising: Yes.    Walking Smoker:  no  Health Maintenance: Pap:  12/2007-Neg History of abnormal Pap:  no MMG:  03-04-15 Density Cat.B/scattered fibroglandular density/Neg/BiRads2:[facility].  Pt wasn't aware she missed it. Colonoscopy:  04-20-11 incomplete;normal to ascending colon--unable to reach cecum with Dr. Juanda Chance.  Had normal barium enema 04-22-11;next due 04/2021. BMD:   Never TDaP:  03/16/11 Pneumonia vaccine(s):  2014 Hep C testing: PCP Screening Labs: PCP, Hb today: PCP, Urine today: PCP   reports that she has never smoked. She has never used smokeless tobacco. She reports that she does not drink alcohol or use drugs.  Past Medical History:  Diagnosis Date  . Allergy   . Breast mass   . Depression   . Diabetes mellitus   . Hypertension   . Sleep apnea    wears CPAP  . Thyroid disease    hypothyroid    Past Surgical History:  Procedure Laterality Date  . bypass leak repair  11/21/12  . GASTRIC BYPASS  11/14/12  . HERNIA REPAIR  5/15  . VAGINAL HYSTERECTOMY  8/07    Current Outpatient Prescriptions  Medication Sig Dispense Refill  . CALCIUM PO Take 1 tablet by mouth 2 (two) times daily.    . Cyanocobalamin (B-12 SL) Place 1 tablet under the tongue daily.    Marland Kitchen escitalopram (LEXAPRO) 20 MG tablet Take 20 mg by mouth at bedtime.     Marland Kitchen estradiol (VIVELLE-DOT) 0.075 MG/24HR Place 1 patch onto the skin 2 (two) times a week. 8 patch 13  . levothyroxine (SYNTHROID, LEVOTHROID) 50 MCG tablet Take 1 tablet by mouth every evening.     . Multiple Vitamin (MULTIVITAMIN WITH MINERALS) TABS tablet Take 1 tablet by mouth daily.    . promethazine (PHENERGAN) 12.5 MG tablet Take 1 tablet (12.5 mg total) by mouth every 8 (eight) hours as  needed for nausea or vomiting. As needed 30 tablet 1  . Vitamin D, Ergocalciferol, (DRISDOL) 50000 UNITS CAPS capsule Take 50,000 Units by mouth once a week. X 6 weeks  0   No current facility-administered medications for this visit.     Family History  Problem Relation Age of Onset  . Colon cancer Neg Hx   . Esophageal cancer Neg Hx   . Rectal cancer Neg Hx   . Stomach cancer Neg Hx   . Hypertension Mother   . Breast cancer Mother   . Cancer Maternal Grandmother     Breast  . Cancer Father   . Heart disease Father     bypass  . Diabetes Father   . Diabetes Paternal Grandfather     ROS:  Pertinent items are noted in HPI.  Otherwise, a comprehensive ROS was negative.  Exam:   BP 110/78 (BP Location: Right Arm, Patient Position: Sitting, Cuff Size: Normal)   Pulse 80   Resp 20   Ht  (1.6 m)   Wt 165 lb (74.8 kg)   LMP 04/04/2005   BMI 29.23 kg/m   Weight change: +3#  Height:  (160 cm)  Ht Readings from Last 3 Encounters:  07/14/16  (1.6 m)  05/01/14  (1.6 m)  08/26/13  (1.6  m)   General appearance: alert, cooperative and appears stated age Head: Normocephalic, without obvious abnormality, atraumatic Neck: no adenopathy, supple, symmetrical, trachea midline and thyroid normal to inspection and palpation Lungs: clear to auscultation bilaterally Breasts: normal appearance, no masses or tenderness Heart: regular rate and rhythm Abdomen: soft, non-tender; bowel sounds normal; no masses,  no organomegaly Extremities: extremities normal, atraumatic, no cyanosis or edema Skin: Skin color, texture, turgor normal. No rashes or lesions Lymph nodes: Cervical, supraclavicular, and axillary nodes normal. No abnormal inguinal nodes palpated Neurologic: Grossly normal  Pelvic: External genitalia:  no lesions              Urethra:  normal appearing urethra with no masses, tenderness or lesions              Bartholins and Skenes: normal                  Vagina: normal appearing vagina with normal color and discharge, no lesions              Cervix: absent              Pap taken: No. Bimanual Exam:  Uterus:  uterus absent              Adnexa: no mass, fullness, tenderness               Rectovaginal: Confirms               Anus:  normal sphincter tone, no lesions  Chaperone was present for exam.  A:  Well Woman with normal exam H/O [facility] 8/07 On HRT s/p gastric bypass 2014 H/O incarcerated hernia, s/p partial small bowel obstruction 5/15 Family hx of breast cancer in mother and MGM.  Has declined genetic testing.  P:   Mammogram guidelines reviewed.  Recommended yearly due to HRT.  Pt aware. pap smear not indicated  Lab work scheduled with PCP Rx for Vivelle dot 0.075mg  patches twice weekly.  #24/4RF RF for phenergan 12.5mg  prn.  Pt just has this on hand due to bariatric surgery.  #30/1RF return annually or prn

## 2016-08-03 DIAGNOSIS — M542 Cervicalgia: Secondary | ICD-10-CM | POA: Diagnosis not present

## 2016-08-03 DIAGNOSIS — M9902 Segmental and somatic dysfunction of thoracic region: Secondary | ICD-10-CM | POA: Diagnosis not present

## 2016-08-03 DIAGNOSIS — M9901 Segmental and somatic dysfunction of cervical region: Secondary | ICD-10-CM | POA: Diagnosis not present

## 2016-08-03 DIAGNOSIS — M546 Pain in thoracic spine: Secondary | ICD-10-CM | POA: Diagnosis not present

## 2016-08-08 DIAGNOSIS — M9901 Segmental and somatic dysfunction of cervical region: Secondary | ICD-10-CM | POA: Diagnosis not present

## 2016-08-08 DIAGNOSIS — M546 Pain in thoracic spine: Secondary | ICD-10-CM | POA: Diagnosis not present

## 2016-08-08 DIAGNOSIS — M542 Cervicalgia: Secondary | ICD-10-CM | POA: Diagnosis not present

## 2016-08-08 DIAGNOSIS — M9902 Segmental and somatic dysfunction of thoracic region: Secondary | ICD-10-CM | POA: Diagnosis not present

## 2016-08-15 DIAGNOSIS — M9902 Segmental and somatic dysfunction of thoracic region: Secondary | ICD-10-CM | POA: Diagnosis not present

## 2016-08-15 DIAGNOSIS — M546 Pain in thoracic spine: Secondary | ICD-10-CM | POA: Diagnosis not present

## 2016-08-15 DIAGNOSIS — M542 Cervicalgia: Secondary | ICD-10-CM | POA: Diagnosis not present

## 2016-08-15 DIAGNOSIS — M9901 Segmental and somatic dysfunction of cervical region: Secondary | ICD-10-CM | POA: Diagnosis not present

## 2016-08-22 DIAGNOSIS — M9902 Segmental and somatic dysfunction of thoracic region: Secondary | ICD-10-CM | POA: Diagnosis not present

## 2016-08-22 DIAGNOSIS — M542 Cervicalgia: Secondary | ICD-10-CM | POA: Diagnosis not present

## 2016-08-22 DIAGNOSIS — M9901 Segmental and somatic dysfunction of cervical region: Secondary | ICD-10-CM | POA: Diagnosis not present

## 2016-08-22 DIAGNOSIS — M546 Pain in thoracic spine: Secondary | ICD-10-CM | POA: Diagnosis not present

## 2016-08-26 DIAGNOSIS — Z1231 Encounter for screening mammogram for malignant neoplasm of breast: Secondary | ICD-10-CM | POA: Diagnosis not present

## 2016-09-05 DIAGNOSIS — M9902 Segmental and somatic dysfunction of thoracic region: Secondary | ICD-10-CM | POA: Diagnosis not present

## 2016-09-05 DIAGNOSIS — M546 Pain in thoracic spine: Secondary | ICD-10-CM | POA: Diagnosis not present

## 2016-09-05 DIAGNOSIS — M9901 Segmental and somatic dysfunction of cervical region: Secondary | ICD-10-CM | POA: Diagnosis not present

## 2016-09-05 DIAGNOSIS — M542 Cervicalgia: Secondary | ICD-10-CM | POA: Diagnosis not present

## 2016-09-23 ENCOUNTER — Encounter: Payer: Self-pay | Admitting: Obstetrics & Gynecology

## 2016-10-06 DIAGNOSIS — Z1389 Encounter for screening for other disorder: Secondary | ICD-10-CM | POA: Diagnosis not present

## 2016-10-06 DIAGNOSIS — M545 Low back pain: Secondary | ICD-10-CM | POA: Diagnosis not present

## 2016-10-06 DIAGNOSIS — Z6828 Body mass index (BMI) 28.0-28.9, adult: Secondary | ICD-10-CM | POA: Diagnosis not present

## 2016-10-18 ENCOUNTER — Ambulatory Visit: Payer: Federal, State, Local not specified - PPO | Admitting: Obstetrics & Gynecology

## 2016-11-02 DIAGNOSIS — M9901 Segmental and somatic dysfunction of cervical region: Secondary | ICD-10-CM | POA: Diagnosis not present

## 2016-11-02 DIAGNOSIS — M9902 Segmental and somatic dysfunction of thoracic region: Secondary | ICD-10-CM | POA: Diagnosis not present

## 2016-11-02 DIAGNOSIS — M546 Pain in thoracic spine: Secondary | ICD-10-CM | POA: Diagnosis not present

## 2016-11-02 DIAGNOSIS — M542 Cervicalgia: Secondary | ICD-10-CM | POA: Diagnosis not present

## 2016-11-14 DIAGNOSIS — Z9884 Bariatric surgery status: Secondary | ICD-10-CM | POA: Diagnosis not present

## 2016-12-16 DIAGNOSIS — Z6829 Body mass index (BMI) 29.0-29.9, adult: Secondary | ICD-10-CM | POA: Diagnosis not present

## 2016-12-16 DIAGNOSIS — J069 Acute upper respiratory infection, unspecified: Secondary | ICD-10-CM | POA: Diagnosis not present

## 2016-12-16 DIAGNOSIS — Z23 Encounter for immunization: Secondary | ICD-10-CM | POA: Diagnosis not present

## 2016-12-16 DIAGNOSIS — E663 Overweight: Secondary | ICD-10-CM | POA: Diagnosis not present

## 2016-12-16 DIAGNOSIS — J209 Acute bronchitis, unspecified: Secondary | ICD-10-CM | POA: Diagnosis not present

## 2016-12-19 ENCOUNTER — Other Ambulatory Visit: Payer: Self-pay | Admitting: Obstetrics & Gynecology

## 2016-12-19 NOTE — Telephone Encounter (Signed)
Medication refill request: promethazine  Last AEX:  07-14-16  Next AEX: 10-09-17  Last MMG (if hormonal medication request): 08-26-16 WNL  Refill authorized: please advise

## 2017-01-09 DIAGNOSIS — M542 Cervicalgia: Secondary | ICD-10-CM | POA: Diagnosis not present

## 2017-01-09 DIAGNOSIS — M9902 Segmental and somatic dysfunction of thoracic region: Secondary | ICD-10-CM | POA: Diagnosis not present

## 2017-01-09 DIAGNOSIS — M546 Pain in thoracic spine: Secondary | ICD-10-CM | POA: Diagnosis not present

## 2017-01-09 DIAGNOSIS — M9901 Segmental and somatic dysfunction of cervical region: Secondary | ICD-10-CM | POA: Diagnosis not present

## 2017-02-01 DIAGNOSIS — Z1389 Encounter for screening for other disorder: Secondary | ICD-10-CM | POA: Diagnosis not present

## 2017-02-01 DIAGNOSIS — R51 Headache: Secondary | ICD-10-CM | POA: Diagnosis not present

## 2017-02-01 DIAGNOSIS — Z6828 Body mass index (BMI) 28.0-28.9, adult: Secondary | ICD-10-CM | POA: Diagnosis not present

## 2017-02-01 DIAGNOSIS — E663 Overweight: Secondary | ICD-10-CM | POA: Diagnosis not present

## 2017-02-01 DIAGNOSIS — J0100 Acute maxillary sinusitis, unspecified: Secondary | ICD-10-CM | POA: Diagnosis not present

## 2017-03-29 DIAGNOSIS — J342 Deviated nasal septum: Secondary | ICD-10-CM | POA: Diagnosis not present

## 2017-03-29 DIAGNOSIS — J343 Hypertrophy of nasal turbinates: Secondary | ICD-10-CM | POA: Diagnosis not present

## 2017-03-29 DIAGNOSIS — R51 Headache: Secondary | ICD-10-CM | POA: Diagnosis not present

## 2017-04-03 ENCOUNTER — Other Ambulatory Visit (INDEPENDENT_AMBULATORY_CARE_PROVIDER_SITE_OTHER): Payer: Self-pay | Admitting: Otolaryngology

## 2017-04-03 DIAGNOSIS — J320 Chronic maxillary sinusitis: Secondary | ICD-10-CM

## 2017-04-12 ENCOUNTER — Ambulatory Visit (HOSPITAL_COMMUNITY)
Admission: RE | Admit: 2017-04-12 | Discharge: 2017-04-12 | Disposition: A | Discharge status: Home / Self Care | Payer: Federal, State, Local not specified - PPO | Source: Ambulatory Visit | Attending: Otolaryngology | Admitting: Otolaryngology

## 2017-04-12 DIAGNOSIS — J320 Chronic maxillary sinusitis: Secondary | ICD-10-CM | POA: Diagnosis not present

## 2017-04-12 DIAGNOSIS — J329 Chronic sinusitis, unspecified: Secondary | ICD-10-CM | POA: Diagnosis not present

## 2017-05-03 DIAGNOSIS — E559 Vitamin D deficiency, unspecified: Secondary | ICD-10-CM | POA: Diagnosis not present

## 2017-05-03 DIAGNOSIS — Z6829 Body mass index (BMI) 29.0-29.9, adult: Secondary | ICD-10-CM | POA: Diagnosis not present

## 2017-05-03 DIAGNOSIS — K9589 Other complications of other bariatric procedure: Secondary | ICD-10-CM | POA: Diagnosis not present

## 2017-05-03 DIAGNOSIS — Z1389 Encounter for screening for other disorder: Secondary | ICD-10-CM | POA: Diagnosis not present

## 2017-05-03 DIAGNOSIS — E039 Hypothyroidism, unspecified: Secondary | ICD-10-CM | POA: Diagnosis not present

## 2017-05-03 DIAGNOSIS — R6510 Systemic inflammatory response syndrome (SIRS) of non-infectious origin without acute organ dysfunction: Secondary | ICD-10-CM | POA: Diagnosis not present

## 2017-09-04 DIAGNOSIS — Z803 Family history of malignant neoplasm of breast: Secondary | ICD-10-CM | POA: Diagnosis not present

## 2017-09-04 DIAGNOSIS — Z1231 Encounter for screening mammogram for malignant neoplasm of breast: Secondary | ICD-10-CM | POA: Diagnosis not present

## 2017-09-12 ENCOUNTER — Encounter: Payer: Self-pay | Admitting: Obstetrics & Gynecology

## 2017-10-09 ENCOUNTER — Encounter: Payer: Self-pay | Admitting: Obstetrics & Gynecology

## 2017-10-09 ENCOUNTER — Other Ambulatory Visit: Payer: Self-pay

## 2017-10-09 ENCOUNTER — Encounter

## 2017-10-09 ENCOUNTER — Ambulatory Visit: Payer: Federal, State, Local not specified - PPO | Admitting: Obstetrics & Gynecology

## 2017-10-09 VITALS — BP 130/64 | HR 64 | Resp 13 | Ht 63.0 in | Wt 165.0 lb

## 2017-10-09 DIAGNOSIS — Z01419 Encounter for gynecological examination (general) (routine) without abnormal findings: Secondary | ICD-10-CM | POA: Diagnosis not present

## 2017-10-09 MED ORDER — PROMETHAZINE HCL 12.5 MG PO TABS: 12.5000 mg | ORAL_TABLET | Freq: Three times a day (TID) | ORAL | 1 refills | Status: DC | PRN

## 2017-10-09 NOTE — Patient Instructions (Addendum)
Please make sure to check about your last tetanus injection and your Hep C antibody testing done.

## 2017-10-09 NOTE — Progress Notes (Signed)
57 y.o. [id number] MarriedCaucasianF here for annual exam.  Doing well.  Off HRT.  Having vaginal dryness.  Denies vaginal bleeding.    Patient's last menstrual period was 04/04/2005.          Sexually active: Yes.    The current method of family planning is tubal ligation.    Exercising: No.  NONE Smoker:  no  Health Maintenance: Pap:  07/14/16 History of abnormal Pap:  no MMG:  6/19 Colonoscopy:  2010, Dr. Juanda ChanceBrodie BMD:   N/A TDaP:  She is not sure (feels Dr. Phillips OdorGolding has done this) Pneumonia vaccine(s):  01/03/13 Shingrix:   no Hep C testing: Thinks this was done with Dr. Phillips OdorGolding Screening Labs: PCP,   reports that she has never smoked. She has never used smokeless tobacco. She reports that she does not drink alcohol or use drugs.  Past Medical History:  Diagnosis Date  . Allergy   . Breast mass   . Depression   . Diabetes mellitus   . Hypertension   . Sleep apnea    wears CPAP  . Thyroid disease    hypothyroid    Past Surgical History:  Procedure Laterality Date  . bypass leak repair  11/21/12  . GASTRIC BYPASS  11/14/12  . HERNIA REPAIR  5/15  . VAGINAL HYSTERECTOMY  8/07    Current Outpatient Medications  Medication Sig Dispense Refill  . CALCIUM PO Take 1 tablet by mouth 2 (two) times daily.    . Cyanocobalamin (B-12 SL) Place 1 tablet under the tongue daily.    Marland Kitchen. escitalopram (LEXAPRO) 20 MG tablet Take 20 mg by mouth at bedtime.     Marland Kitchen. levothyroxine (SYNTHROID, LEVOTHROID) 50 MCG tablet Take 1 tablet by mouth every evening.     . Multiple Vitamin (MULTIVITAMIN WITH MINERALS) TABS tablet Take 1 tablet by mouth daily.    . promethazine (PHENERGAN) 12.5 MG tablet TAKE (1) TABLET BY MOUTH EVERY EIGHT HOURS AS NEEDED. 30 tablet 0  . Vitamin D, Ergocalciferol, (DRISDOL) 50000 UNITS CAPS capsule Take 50,000 Units by mouth once a week. X 6 weeks  0   No current facility-administered medications for this visit.     Family History  Problem Relation Age of Onset  .  Hypertension Mother   . Breast cancer Mother   . Cancer Father   . Heart disease Father        bypass  . Diabetes Father   . Breast cancer Maternal Grandmother           . Diabetes Paternal Grandfather   . Congestive Heart Failure Maternal Aunt   . Colon cancer Neg Hx   . Esophageal cancer Neg Hx   . Rectal cancer Neg Hx   . Stomach cancer Neg Hx     Review of Systems  All other systems reviewed and are negative.   Exam:   BP 130/64 (BP Location: Right Arm, Patient Position: Sitting, Cuff Size: Normal)   Pulse 64   Resp 13   Ht 5\' 3"  (1.6 m)   Wt 165 lb (74.8 kg)   LMP 04/04/2005   BMI 29.23 kg/m      Height: 5\' 3"  (160 cm)  Ht Readings from Last 3 Encounters:  10/09/17 5\' 3"  (1.6 m)  07/14/16 5\' 3"  (1.6 m)  05/01/14 5\' 3"  (1.6 m)    General appearance: alert, cooperative and appears stated age Head: Normocephalic, without obvious abnormality, atraumatic Neck: no adenopathy, supple, symmetrical, trachea midline and thyroid normal  to inspection and palpation Lungs: clear to auscultation bilaterally Breasts: normal appearance, no masses or tenderness Heart: regular rate and rhythm Abdomen: soft, non-tender; bowel sounds normal; no masses,  no organomegaly Extremities: extremities normal, atraumatic, no cyanosis or edema Skin: Skin color, texture, turgor normal. No rashes or lesions Lymph nodes: Cervical, supraclavicular, and axillary nodes normal. No abnormal inguinal nodes palpated Neurologic: Grossly normal   Pelvic: External genitalia:  no lesions              Urethra:  normal appearing urethra with no masses, tenderness or lesions              Bartholins and Skenes: normal                 Vagina: normal appearing vagina with normal color and discharge, no lesions              Cervix: absent              Pap taken: No. Bimanual Exam:  Uterus:  uterus absent              Adnexa: no mass, fullness, tenderness               Rectovaginal: Confirms                Anus:  normal sphincter tone, no lesions  Chaperone was present for exam.  A:  Well Woman with normal exam PMP, off HT H/o [facility] 8/07 Gastric bypass 2014 H/O incarcerated hernia, s/p partial small bowel obstruction 5/15 Family hx of breast cancer in mother and MGM.  We have discussed genetic testing.  P:   Mammogram guidelines reviewed.  Doing 3D. pap smear not indicated RF for phenergan 12.5mg  prn nausea.  Uses rarely.  #0/1RF. Lab work and vaccines done with Dr. Phillips Odor Plan BMD at 58 return annually or prn

## 2017-11-13 DIAGNOSIS — Z9884 Bariatric surgery status: Secondary | ICD-10-CM | POA: Diagnosis not present

## 2017-12-13 DIAGNOSIS — K080 Exfoliation of teeth due to systemic causes: Secondary | ICD-10-CM | POA: Diagnosis not present

## 2017-12-30 DIAGNOSIS — R319 Hematuria, unspecified: Secondary | ICD-10-CM | POA: Diagnosis not present

## 2017-12-30 DIAGNOSIS — R5383 Other fatigue: Secondary | ICD-10-CM | POA: Diagnosis not present

## 2018-01-18 DIAGNOSIS — E663 Overweight: Secondary | ICD-10-CM | POA: Diagnosis not present

## 2018-01-18 DIAGNOSIS — Z6828 Body mass index (BMI) 28.0-28.9, adult: Secondary | ICD-10-CM | POA: Diagnosis not present

## 2018-01-18 DIAGNOSIS — N342 Other urethritis: Secondary | ICD-10-CM | POA: Diagnosis not present

## 2018-04-18 ENCOUNTER — Encounter (HOSPITAL_COMMUNITY): Payer: Self-pay | Admitting: Emergency Medicine

## 2018-04-18 ENCOUNTER — Emergency Department (HOSPITAL_COMMUNITY): Payer: Federal, State, Local not specified - PPO

## 2018-04-18 ENCOUNTER — Inpatient Hospital Stay (HOSPITAL_COMMUNITY)
Admission: EM | Admit: 2018-04-18 | Discharge: 2018-04-20 | DRG: 872 | Disposition: A | Discharge status: Home / Self Care | Payer: Federal, State, Local not specified - PPO | Attending: Internal Medicine | Admitting: Internal Medicine

## 2018-04-18 ENCOUNTER — Other Ambulatory Visit: Payer: Self-pay

## 2018-04-18 DIAGNOSIS — E039 Hypothyroidism, unspecified: Secondary | ICD-10-CM | POA: Diagnosis not present

## 2018-04-18 DIAGNOSIS — F329 Major depressive disorder, single episode, unspecified: Secondary | ICD-10-CM | POA: Diagnosis not present

## 2018-04-18 DIAGNOSIS — Z8249 Family history of ischemic heart disease and other diseases of the circulatory system: Secondary | ICD-10-CM | POA: Diagnosis not present

## 2018-04-18 DIAGNOSIS — N10 Acute tubulo-interstitial nephritis: Secondary | ICD-10-CM | POA: Diagnosis not present

## 2018-04-18 DIAGNOSIS — Z833 Family history of diabetes mellitus: Secondary | ICD-10-CM | POA: Diagnosis not present

## 2018-04-18 DIAGNOSIS — Z9884 Bariatric surgery status: Secondary | ICD-10-CM | POA: Diagnosis not present

## 2018-04-18 DIAGNOSIS — A4151 Sepsis due to Escherichia coli [E. coli]: Principal | ICD-10-CM

## 2018-04-18 DIAGNOSIS — Z803 Family history of malignant neoplasm of breast: Secondary | ICD-10-CM

## 2018-04-18 DIAGNOSIS — A419 Sepsis, unspecified organism: Secondary | ICD-10-CM | POA: Diagnosis present

## 2018-04-18 DIAGNOSIS — N390 Urinary tract infection, site not specified: Secondary | ICD-10-CM

## 2018-04-18 DIAGNOSIS — N200 Calculus of kidney: Secondary | ICD-10-CM | POA: Diagnosis present

## 2018-04-18 LAB — CBC WITH DIFFERENTIAL/PLATELET
Abs Immature Granulocytes: 0.05 10*3/uL (ref 0.00–0.07)
Basophils Absolute: 0.1 10*3/uL (ref 0.0–0.1)
Basophils Relative: 0 %
Eosinophils Absolute: 0.1 10*3/uL (ref 0.0–0.5)
Eosinophils Relative: 1 %
HCT: 45.4 % (ref 36.0–46.0)
Hemoglobin: 14.7 g/dL (ref 12.0–15.0)
Immature Granulocytes: 0 %
Lymphocytes Relative: 10 %
Lymphs Abs: 1.4 10*3/uL (ref 0.7–4.0)
MCH: 31.1 pg (ref 26.0–34.0)
MCHC: 32.4 g/dL (ref 30.0–36.0)
MCV: 96.2 fL (ref 80.0–100.0)
Monocytes Absolute: 0.8 10*3/uL (ref 0.1–1.0)
Monocytes Relative: 5 %
Neutro Abs: 11.6 10*3/uL — ABNORMAL HIGH (ref 1.7–7.7)
Neutrophils Relative %: 84 %
Platelets: 199 10*3/uL (ref 150–400)
RBC: 4.72 MIL/uL (ref 3.87–5.11)
RDW: 13.5 % (ref 11.5–15.5)
WBC: 14 10*3/uL — ABNORMAL HIGH (ref 4.0–10.5)
nRBC: 0 % (ref 0.0–0.2)

## 2018-04-18 LAB — URINALYSIS, ROUTINE W REFLEX MICROSCOPIC
Bilirubin Urine: NEGATIVE
Glucose, UA: NEGATIVE mg/dL
Ketones, ur: NEGATIVE mg/dL
Nitrite: POSITIVE — AB
Protein, ur: 30 mg/dL — AB
Specific Gravity, Urine: 1.015 (ref 1.005–1.030)
WBC, UA: >50 WBC/hpf — ABNORMAL HIGH (ref 0–5)
pH: 5 (ref 5.0–8.0)

## 2018-04-18 LAB — COMPREHENSIVE METABOLIC PANEL
ALT: 19 U/L (ref 0–44)
AST: 19 U/L (ref 15–41)
Albumin: 4.3 g/dL (ref 3.5–5.0)
Alkaline Phosphatase: 94 U/L (ref 38–126)
Anion gap: 11 (ref 5–15)
BUN: 17 mg/dL (ref 6–20)
CO2: 26 mmol/L (ref 22–32)
Calcium: 9.3 mg/dL (ref 8.9–10.3)
Chloride: 101 mmol/L (ref 98–111)
Creatinine, Ser: 0.78 mg/dL (ref 0.44–1.00)
GFR calc Af Amer: >60 mL/min (ref >60)
GFR calc non Af Amer: >60 mL/min (ref >60)
Glucose, Bld: 122 mg/dL — ABNORMAL HIGH (ref 70–99)
Potassium: 3.7 mmol/L (ref 3.5–5.1)
Sodium: 138 mmol/L (ref 135–145)
Total Bilirubin: 0.8 mg/dL (ref 0.3–1.2)
Total Protein: 7.6 g/dL (ref 6.5–8.1)

## 2018-04-18 LAB — PROCALCITONIN: Procalcitonin: 0.13 ng/mL

## 2018-04-18 LAB — APTT: aPTT: 32 seconds (ref 24–36)

## 2018-04-18 LAB — I-STAT CG4 LACTIC ACID, ED
Lactic Acid, Venous: 2.26 mmol/L (ref 0.5–1.9)
Lactic Acid, Venous: 0.87 mmol/L (ref 0.5–1.9)

## 2018-04-18 LAB — LACTIC ACID, PLASMA: Lactic Acid, Venous: 1 mmol/L (ref 0.5–1.9)

## 2018-04-18 LAB — PROTIME-INR
INR: 1.12
Prothrombin Time: 14.3 seconds (ref 11.4–15.2)

## 2018-04-18 MED ORDER — ACETAMINOPHEN 325 MG PO TABS
650.0000 mg | ORAL_TABLET | Freq: Once | ORAL | Status: AC
  Administered 2018-04-18: 650 mg via ORAL
  Filled 2018-04-18: qty 2

## 2018-04-18 MED ORDER — POTASSIUM CHLORIDE IN NACL 20-0.9 MEQ/L-% IV SOLN
INTRAVENOUS | Status: AC
  Filled 2018-04-18: qty 1000

## 2018-04-18 MED ORDER — SODIUM CHLORIDE 0.9 % IV SOLN
1.0000 g | Freq: Once | INTRAVENOUS | Status: AC
  Administered 2018-04-18: 1 g via INTRAVENOUS
  Filled 2018-04-18: qty 10

## 2018-04-18 MED ORDER — POTASSIUM CHLORIDE IN NACL 20-0.9 MEQ/L-% IV SOLN
INTRAVENOUS | Status: DC
  Administered 2018-04-18: 22:00:00 via INTRAVENOUS

## 2018-04-18 MED ORDER — POTASSIUM CHLORIDE IN NACL 20-0.9 MEQ/L-% IV SOLN
INTRAVENOUS | Status: AC
  Administered 2018-04-18 – 2018-04-19 (×4): via INTRAVENOUS

## 2018-04-18 MED ORDER — ENOXAPARIN SODIUM 40 MG/0.4ML ~~LOC~~ SOLN
40.0000 mg | SUBCUTANEOUS | Status: DC
  Administered 2018-04-18 – 2018-04-19 (×2): 40 mg via SUBCUTANEOUS
  Filled 2018-04-18 (×2): qty 0.4

## 2018-04-18 MED ORDER — LACTATED RINGERS IV BOLUS
1000.0000 mL | Freq: Once | INTRAVENOUS | Status: AC
  Administered 2018-04-18: 1000 mL via INTRAVENOUS

## 2018-04-18 MED ORDER — SODIUM CHLORIDE 0.9 % IV BOLUS
1000.0000 mL | Freq: Once | INTRAVENOUS | Status: AC
  Administered 2018-04-18: 1000 mL via INTRAVENOUS

## 2018-04-18 MED ORDER — ADULT MULTIVITAMIN W/MINERALS CH
1.0000 | ORAL_TABLET | Freq: Every day | ORAL | Status: DC
  Administered 2018-04-18 – 2018-04-20 (×3): 1 via ORAL
  Filled 2018-04-18 (×3): qty 1

## 2018-04-18 MED ORDER — SODIUM CHLORIDE 0.9 % IV SOLN
2.0000 g | INTRAVENOUS | Status: DC
  Administered 2018-04-19: 2 g via INTRAVENOUS
  Filled 2018-04-18: qty 20
  Filled 2018-04-18: qty 2
  Filled 2018-04-18 (×2): qty 20

## 2018-04-18 MED ORDER — VITAMIN D 25 MCG (1000 UNIT) PO TABS
1000.0000 [IU] | ORAL_TABLET | Freq: Every day | ORAL | Status: DC
  Administered 2018-04-18 – 2018-04-20 (×3): 1000 [IU] via ORAL
  Filled 2018-04-18 (×3): qty 1

## 2018-04-18 MED ORDER — ACETAMINOPHEN 325 MG PO TABS
650.0000 mg | ORAL_TABLET | Freq: Four times a day (QID) | ORAL | Status: DC | PRN
  Administered 2018-04-18: 650 mg via ORAL
  Filled 2018-04-18: qty 2

## 2018-04-18 MED ORDER — ESCITALOPRAM OXALATE 10 MG PO TABS
20.0000 mg | ORAL_TABLET | Freq: Every day | ORAL | Status: DC
  Administered 2018-04-18 – 2018-04-19 (×2): 20 mg via ORAL
  Filled 2018-04-18 (×2): qty 2

## 2018-04-18 NOTE — H&P (Signed)
History and Physical  LEV PONCE UVO:[id number] DOB: 12/17/1960 DOA: 04/18/2018   PCP: Assunta Found, MD   Patient coming from: Home  Chief Complaint: Abdominal pain  HPI:  Melissa Pitts is a 58 y.o. female with medical history of gastric bypass 2015, depression, hypothyroidism presented with right lower quadrant abdominal pain that began around 3 PM on 04/17/2018.  The patient tried taking some Gas-X without relief.  The pain was constant and became severe.  On the morning of 04/18/2018, the patient stated that the pain was unrelenting.  This resulted in her coming to the emergency department for further evaluation.  The patient has been subjective fevers and chills.  She had some nausea but denied any emesis.  She denied headache, neck pain, chest pain, shortness breath, coughing, sore throat, diarrhea, dysuria, hematuria.  The patient denies any new medications.  She denies any hematochezia or melena. Upon presentation, the patient had a temperature up to 100.7 F, but was hemodynamically stable.  WBC was 14.0 with hemoglobin 14.7 and platelets 199,000.  BMP and LFTs were unremarkable.  CT of the abdomen and pelvis showed 3 nonobstructive right renal stones.  There was stranding in the bilateral kidneys, right greater than the left but a small volume perinephric fluid.  There is no pneumoperitoneum.  There was cholelithiasis without cholecystitis.  Assessment/Plan: Sepsis -Present at the time of admission -Lactic acid peaked 2.26 -Check procalcitonin -Source is likely pyelonephritis -Follow urine cultures -Blood cultures x2 sets--unfortunately, this was obtained after the patient received ceftriaxone -Continue IV fluids  Pyelonephritis -Continue ceftriaxone pending culture data  Hypothyroidism -Continue Synthroid  Depression -Continue Lexapro  Active Problems:   * No active hospital problems. *       Past Medical History:  Diagnosis Date  . Allergy   . Breast mass     . Depression   . Diabetes mellitus   . Hypertension   . Sleep apnea    wears CPAP  . Thyroid disease    hypothyroid   Past Surgical History:  Procedure Laterality Date  . bypass leak repair  11/21/12  . GASTRIC BYPASS  11/14/12  . HERNIA REPAIR  5/15  . VAGINAL HYSTERECTOMY  8/07   Social History:  reports that she has never smoked. She has never used smokeless tobacco. She reports that she does not drink alcohol or use drugs.   Family History  Problem Relation Age of Onset  . Hypertension Mother   . Breast cancer Mother   . Cancer Father   . Heart disease Father        bypass  . Diabetes Father   . Breast cancer Maternal Grandmother           . Diabetes Paternal Grandfather   . Congestive Heart Failure Maternal Aunt   . Colon cancer Neg Hx   . Esophageal cancer Neg Hx   . Rectal cancer Neg Hx   . Stomach cancer Neg Hx      Allergies  Allergen Reactions  . Sulfamethoxazole Swelling  . Penicillins Rash and Swelling  . Sulfur Rash     Prior to Admission medications   Medication Sig Start Date End Date Taking? Authorizing Provider  CALCIUM PO Take 1 tablet by mouth 2 (two) times daily.   Yes [provider]  cholecalciferol (VITAMIN D3) 25 MCG (1000 UT) tablet Take 1,000 Units by mouth daily.   Yes [provider]  Cyanocobalamin (B-12 SL) Place 1 tablet under  the tongue daily.   Yes [provider]  escitalopram (LEXAPRO) 20 MG tablet Take 20 mg by mouth at bedtime.  05/29/13  Yes [provider]  levothyroxine (SYNTHROID, LEVOTHROID) 50 MCG tablet Take 1 tablet by mouth every evening.  03/07/11  Yes [provider]  Multiple Vitamin (MULTIVITAMIN WITH MINERALS) TABS tablet Take 1 tablet by mouth daily.   Yes [provider]  promethazine (PHENERGAN) 12.5 MG tablet Take 1 tablet (12.5 mg total) by mouth every 8 (eight) hours as needed for nausea or vomiting. 10/09/17  Yes Jerene BearsMiller, Mary S, MD    Review of Systems:   Constitutional:  No weight loss, night sweats, Fevers, chills, fatigue.  Head&Eyes: No headache.  No vision loss.  No eye pain or scotoma ENT:  No Difficulty swallowing,Tooth/dental problems,Sore throat,  No ear ache, post nasal drip,  Cardio-vascular:  No chest pain, Orthopnea, PND, swelling in lower extremities,  dizziness, palpitations  GI:  No  vomiting, diarrhea, loss of appetite, hematochezia, melena, heartburn, indigestion, Resp:  No shortness of breath with exertion or at rest. No cough. No coughing up of blood .No wheezing.No chest wall deformity  Skin:  no rash or lesions.  GU:  no dysuria, change in color of urine, no urgency or frequency. No flank pain.  Musculoskeletal:  No joint pain or swelling. No decreased range of motion. No back pain.  Psych:  No change in mood or affect. No depression or anxiety. Neurologic: No headache, no dysesthesia, no focal weakness, no vision loss. No syncope  Physical Exam: Vitals:   04/18/18 1330 04/18/18 1346 04/18/18 1400 04/18/18 1430  BP: (!) 134/50 (!) 134/50 112/71 118/63  Pulse: 96 88 85 82  Resp:      Temp:  (!) 100.7 F (38.2 C)    TempSrc:  Oral    SpO2: 97% 98% 94% 96%  Weight:      Height:       General:  A&O x 3, NAD, nontoxic, pleasant/cooperative Head/Eye: No conjunctival hemorrhage, no icterus, [place]/AT, No nystagmus ENT:  No icterus,  No thrush, good dentition, no pharyngeal exudate Neck:  No masses, no lymphadenpathy, no bruits CV:  RRR, no rub, no gallop, no S3 Lung:  CTAB, good air movement, no wheeze, no rhonchi Abdomen: soft/NT, +BS, nondistended, no peritoneal signs Ext: No cyanosis, No rashes, No petechiae, No lymphangitis, No edema Neuro: CNII-XII intact, strength 4/5 in bilateral upper and lower extremities, no dysmetria  Labs on Admission:  Basic Metabolic Panel: Recent Labs  Lab 04/18/18 1120  NA 138  K 3.7  CL 101  CO2 26  GLUCOSE 122*  BUN 17  CREATININE 0.78  CALCIUM 9.3   Liver  Function Tests: Recent Labs  Lab 04/18/18 1120  AST 19  ALT 19  ALKPHOS 94  BILITOT 0.8  PROT 7.6  ALBUMIN 4.3   No results for input(s): LIPASE, AMYLASE in the last 168 hours. No results for input(s): AMMONIA in the last 168 hours. CBC: Recent Labs  Lab 04/18/18 1120  WBC 14.0*  NEUTROABS 11.6*  HGB 14.7  HCT 45.4  MCV 96.2  PLT 199   Coagulation Profile: No results for input(s): INR, PROTIME in the last 168 hours. Cardiac Enzymes: No results for input(s): CKTOTAL, CKMB, CKMBINDEX, TROPONINI in the last 168 hours. BNP: Invalid input(s): POCBNP CBG: No results for input(s): GLUCAP in the last 168 hours. Urine analysis:    Component Value Date/Time   COLORURINE YELLOW 04/18/2018 1128   APPEARANCEUR  CLOUDY (A) 04/18/2018 1128   LABSPEC 1.015 04/18/2018 1128   PHURINE 5.0 04/18/2018 1128   GLUCOSEU NEGATIVE 04/18/2018 1128   HGBUR MODERATE (A) 04/18/2018 1128   BILIRUBINUR NEGATIVE 04/18/2018 1128   BILIRUBINUR small 05/04/2013 1443   KETONESUR NEGATIVE 04/18/2018 1128   PROTEINUR 30 (A) 04/18/2018 1128   UROBILINOGEN 0.2 05/04/2013 1443   UROBILINOGEN 0.2 11/21/2012 0536   NITRITE POSITIVE (A) 04/18/2018 1128   LEUKOCYTESUR LARGE (A) 04/18/2018 1128   Sepsis Labs: @LABRCNTIP (procalcitonin:4,lacticidven:4) )No results found for this or any previous visit (from the past 240 hour(s)).   Radiological Exams on Admission: Ct Renal Stone Study  Result Date: 04/18/2018 CLINICAL DATA:  Onset right lower quadrant pain and hematuria yesterday. EXAM: CT ABDOMEN AND PELVIS WITHOUT CONTRAST TECHNIQUE: Multidetector CT imaging of the abdomen and pelvis was performed following the standard protocol without IV contrast. COMPARISON:  CT abdomen and pelvis 08/26/2013. FINDINGS: Lower chest: Lung bases clear.  No pleural or pericardial effusion. Hepatobiliary: 2.6 cm stone is seen in the gallbladder, unchanged. No CT evidence of cholecystitis. The liver appears normal. Pancreas:  Unremarkable. No pancreatic ductal dilatation or surrounding inflammatory changes. Spleen: Normal in size without focal abnormality. Adrenals/Urinary Tract: The adrenal glands appear normal. Three stones are seen in the right kidney. The largest is in the lower pole and measures 1 cm in diameter. There is no right hydronephrosis and no ureteral stone is identified. No left renal stones are seen. There is some stranding about both kidneys, much greater on the right where there is also a small volume of perinephric fluid. Stomach/Bowel: The patient is status post gastric bypass. Small hiatal hernia is seen. Stomach and small bowel are otherwise unremarkable. The colon appears normal. The appendix is not seen and may have been removed. No evidence of appendicitis. Vascular/Lymphatic: No significant vascular findings are present. No enlarged abdominal or pelvic lymph nodes. Reproductive: Status post hysterectomy. No adnexal masses. Other: None. Musculoskeletal: No acute or focal abnormality. Degenerative disc disease L4-5 noted. IMPRESSION: Negative for hydronephrosis. The patient has 3 nonobstructing right renal stones. The largest measures 1 cm in diameter. Bilateral perinephric stranding is much greater on the right. While nonspecific, this could be secondary to pyelonephritis. 2.6 cm gallstone without CT evidence of cholecystitis. Small hiatal hernia. Status post gastric bypass and hysterectomy. Electronically Signed   By: Drusilla Kannerhomas  Dalessio M.D.   On: 04/18/2018 14:02      Time spent:60 minutes Code Status:   FULL Family Communication: Spouse updated at bedside Disposition Plan: expect 2-3 day hospitalization Consults called: none DVT Prophylaxis: Graham Lovenox  Catarina HartshornDavid Shanekia Latella, DO  [facility] Pager [phone]  If 7PM-7AM, please contact night-coverage [email or web address] Password [email or web address] 04/18/2018, 3:02 PM

## 2018-04-18 NOTE — ED Triage Notes (Signed)
Sharp pain to rt lower abd and rt lower back since yesterday.

## 2018-04-18 NOTE — ED Notes (Signed)
CRITICAL VALUE ALERT  Critical Value:  Lactic Acid 2.26  Date & Time Notied:  04/18/2018 1305  Provider Notified: Criss AlvineGoldston  Orders Received/Actions taken: Nurse notified

## 2018-04-18 NOTE — ED Provider Notes (Addendum)
[facility] EMERGENCY DEPARTMENT Provider Note   CSN: [id number] Arrival date & time: 04/18/18  1037     History   Chief Complaint Chief Complaint  Patient presents with  . Abdominal Pain    HPI Melissa Pitts is a 58 y.o. female.  HPI  58 year old female presents with right lower quadrant abdominal pain.  Started yesterday afternoon.  Pain has been constant though at times it is worse than others.  Currently it is pretty mild.  She took Gas-X multiple times and while she did have increased gas, she has not had any improvement in her symptoms.  No vomiting, diarrhea, constipation.  No hematuria/dysuria.  The pain does seem to radiate to her right low back.  Is never had pain like this before. No fevers but felt some chills.  Past Medical History:  Diagnosis Date  . Allergy   . Breast mass   . Depression   . Diabetes mellitus   . Hypertension   . Sleep apnea    wears CPAP  . Thyroid disease    hypothyroid    Patient Active Problem List   Diagnosis Date Noted  . Anastomotic ulcer 02/08/2013  . Hypomagnesemia 01/01/2013  . SIRS (systemic inflammatory response syndrome) (HCC) 12/24/2012  . 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 Bariatric surgery status 11/21/2012  . Clinical depression 06/25/2012  . Hypothyroidism 06/25/2012    Past Surgical History:  Procedure Laterality Date  . bypass leak repair  11/21/12  . GASTRIC BYPASS  11/14/12  . HERNIA REPAIR  5/15  . VAGINAL HYSTERECTOMY  8/07     OB History    Gravida  2   Para  1   Term      Preterm      AB  1   Living  1     SAB  1   TAB      Ectopic      Multiple      Live Births               Home Medications    Prior to Admission medications   Medication Sig Start Date End Date Taking? Authorizing Provider  CALCIUM PO Take 1 tablet by mouth 2 (two) times daily.   Yes [provider]  cholecalciferol (VITAMIN D3) 25 MCG (1000 UT) tablet Take 1,000 Units by mouth daily.    Yes [provider]  Cyanocobalamin (B-12 SL) Place 1 tablet under the tongue daily.   Yes [provider]  escitalopram (LEXAPRO) 20 MG tablet Take 20 mg by mouth at bedtime.  05/29/13  Yes [provider]  levothyroxine (SYNTHROID, LEVOTHROID) 50 MCG tablet Take 1 tablet by mouth every evening.  03/07/11  Yes [provider]  Multiple Vitamin (MULTIVITAMIN WITH MINERALS) TABS tablet Take 1 tablet by mouth daily.   Yes [provider]  promethazine (PHENERGAN) 12.5 MG tablet Take 1 tablet (12.5 mg total) by mouth every 8 (eight) hours as needed for nausea or vomiting. 10/09/17  Yes Jerene Bears, MD    Family History Family History  Problem Relation Age of Onset  . Hypertension Mother   . Breast cancer Mother   . Cancer Father   . Heart disease Father        bypass  . Diabetes Father   . Breast cancer Maternal Grandmother           . Diabetes Paternal Grandfather   . Congestive Heart Failure Maternal Aunt   .  Colon cancer Neg Hx   . Esophageal cancer Neg Hx   . Rectal cancer Neg Hx   . Stomach cancer Neg Hx     Social History Social History   Tobacco Use  . Smoking status: Never Smoker  . Smokeless tobacco: Never Used  Substance Use Topics  . Alcohol use: No  . Drug use: No     Allergies   Sulfamethoxazole; Penicillins; and Sulfur   Review of Systems Review of Systems  Constitutional: Negative for fever.  Gastrointestinal: Positive for abdominal pain. Negative for constipation, diarrhea and vomiting.  Genitourinary: Negative for dysuria and hematuria.  Musculoskeletal: Positive for back pain.  All other systems reviewed and are negative.    Physical Exam Updated Vital Signs BP 118/63   Pulse 82   Temp (!) 100.7 F (38.2 C) (Oral)   Resp 18   Ht 5\' 3"  (1.6 m)   Wt 72.6 kg   LMP 04/04/2005   SpO2 96%   BMI 28.34 kg/m   Physical Exam Vitals signs and nursing note reviewed.  Constitutional:      General: She  is not in acute distress.    Appearance: She is well-developed. She is not ill-appearing or diaphoretic.  HENT:     Head: Normocephalic and atraumatic.     Right Ear: External ear normal.     Left Ear: External ear normal.     Nose: Nose normal.  Eyes:     General:        Right eye: No discharge.        Left eye: No discharge.  Cardiovascular:     Rate and Rhythm: Normal rate and regular rhythm.     Heart sounds: Normal heart sounds.  Pulmonary:     Effort: Pulmonary effort is normal.     Breath sounds: Normal breath sounds.  Abdominal:     Palpations: Abdomen is soft.     Tenderness: There is abdominal tenderness (mild) in the right lower quadrant. There is no right CVA tenderness or left CVA tenderness.  Musculoskeletal:     Lumbar back: She exhibits tenderness (mild). She exhibits no bony tenderness.       Back:  Skin:    General: Skin is warm and dry.  Neurological:     Mental Status: She is alert.  Psychiatric:        Mood and Affect: Mood is not anxious.      ED Treatments / Results  Labs (all labs ordered are listed, but only abnormal results are displayed) Labs Reviewed  URINALYSIS, ROUTINE W REFLEX MICROSCOPIC - Abnormal; Notable for the following components:      Result Value   APPearance CLOUDY (*)    Hgb urine dipstick MODERATE (*)    Protein, ur 30 (*)    Nitrite POSITIVE (*)    Leukocytes, UA LARGE (*)    WBC, UA >50 (*)    Bacteria, UA MANY (*)    All other components within normal limits  COMPREHENSIVE METABOLIC PANEL - Abnormal; Notable for the following components:   Glucose, Bld 122 (*)    All other components within normal limits  CBC WITH DIFFERENTIAL/PLATELET - Abnormal; Notable for the following components:   WBC 14.0 (*)    Neutro Abs 11.6 (*)    All other components within normal limits  I-STAT CG4 LACTIC ACID, ED - Abnormal; Notable for the following components:   Lactic Acid, Venous 2.26 (*)    All other components within normal  limits  URINE CULTURE  I-STAT CG4 LACTIC ACID, ED    EKG None  Radiology Ct Renal Stone Study  Result Date: 04/18/2018 CLINICAL DATA:  Onset right lower quadrant pain and hematuria yesterday. EXAM: CT ABDOMEN AND PELVIS WITHOUT CONTRAST TECHNIQUE: Multidetector CT imaging of the abdomen and pelvis was performed following the standard protocol without IV contrast. COMPARISON:  CT abdomen and pelvis 08/26/2013. FINDINGS: Lower chest: Lung bases clear.  No pleural or pericardial effusion. Hepatobiliary: 2.6 cm stone is seen in the gallbladder, unchanged. No CT evidence of cholecystitis. The liver appears normal. Pancreas: Unremarkable. No pancreatic ductal dilatation or surrounding inflammatory changes. Spleen: Normal in size without focal abnormality. Adrenals/Urinary Tract: The adrenal glands appear normal. Three stones are seen in the right kidney. The largest is in the lower pole and measures 1 cm in diameter. There is no right hydronephrosis and no ureteral stone is identified. No left renal stones are seen. There is some stranding about both kidneys, much greater on the right where there is also a small volume of perinephric fluid. Stomach/Bowel: The patient is status post gastric bypass. Small hiatal hernia is seen. Stomach and small bowel are otherwise unremarkable. The colon appears normal. The appendix is not seen and may have been removed. No evidence of appendicitis. Vascular/Lymphatic: No significant vascular findings are present. No enlarged abdominal or pelvic lymph nodes. Reproductive: Status post hysterectomy. No adnexal masses. Other: None. Musculoskeletal: No acute or focal abnormality. Degenerative disc disease L4-5 noted. IMPRESSION: Negative for hydronephrosis. The patient has 3 nonobstructing right renal stones. The largest measures 1 cm in diameter. Bilateral perinephric stranding is much greater on the right. While nonspecific, this could be secondary to pyelonephritis. 2.6 cm  gallstone without CT evidence of cholecystitis. Small hiatal hernia. Status post gastric bypass and hysterectomy. Electronically Signed   By: Drusilla Kannerhomas  Dalessio M.D.   On: 04/18/2018 14:02    Procedures .Critical Care Performed by: Pricilla LovelessGoldston, Josia Cueva, MD Authorized by: Pricilla LovelessGoldston, Sakeenah Valcarcel, MD   Critical care provider statement:    Critical care time (minutes):  30   Critical care was necessary to treat or prevent imminent or life-threatening deterioration of the following conditions:  Sepsis   Critical care was time spent personally by me on the following activities:  Development of treatment plan with patient or surrogate, discussions with consultants, evaluation of patient's response to treatment, examination of patient, obtaining history from patient or surrogate, ordering and performing treatments and interventions, ordering and review of laboratory studies, ordering and review of radiographic studies, pulse oximetry, re-evaluation of patient's condition and review of old charts   (including critical care time)  Medications Ordered in ED Medications  lactated ringers bolus 1,000 mL (1,000 mLs Intravenous New Bag/Given 04/18/18 1346)  sodium chloride 0.9 % bolus 1,000 mL (0 mLs Intravenous Stopped 04/18/18 1251)  cefTRIAXone (ROCEPHIN) 1 g in sodium chloride 0.9 % 100 mL IVPB (0 g Intravenous Stopped 04/18/18 1402)  acetaminophen (TYLENOL) tablet 650 mg (650 mg Oral Given 04/18/18 1317)     Initial Impression / Assessment and Plan / ED Course  I have reviewed the triage vital signs and the nursing notes.  Pertinent labs & imaging results that were available during my care of the patient were reviewed by me and considered in my medical decision making (see chart for details).     Patient has developed a low-grade fever here in the emergency department.  She was given Tylenol and started on IV Rocephin after urine has come back positive  for urinary tract infection.  CT obtained to help rule out  ureteral stone and while there is no obstructing stone there are multiple stones in the kidney.  Discussed with urology, Dr. Ronne BinningMcKenzie, she will need outpatient urology follow-up but no acute urologic intervention.  Given the elevated lactate in association with acute pyelonephritis she will need admission for fluids and antibiotics.  Dr. Arbutus Leasat to admit.  Final Clinical Impressions(s) / ED Diagnoses   Final diagnoses:  Acute pyelonephritis  Sepsis secondary to UTI [facility](HCC)    ED Discharge Orders    None       Pricilla LovelessGoldston, Margan Elias, MD 04/18/18 1446    Pricilla LovelessGoldston, Shebra Muldrow, MD 04/30/18 2251

## 2018-04-18 NOTE — ED Notes (Signed)
Attempt report 

## 2018-04-18 NOTE — ED Notes (Signed)
Patient to CT.

## 2018-04-19 ENCOUNTER — Other Ambulatory Visit: Payer: Self-pay

## 2018-04-19 LAB — CBC
HCT: 36.6 % (ref 36.0–46.0)
Hemoglobin: 12 g/dL (ref 12.0–15.0)
MCH: 31.6 pg (ref 26.0–34.0)
MCHC: 32.8 g/dL (ref 30.0–36.0)
MCV: 96.3 fL (ref 80.0–100.0)
Platelets: 160 10*3/uL (ref 150–400)
RBC: 3.8 MIL/uL — ABNORMAL LOW (ref 3.87–5.11)
RDW: 13.5 % (ref 11.5–15.5)
WBC: 10.5 10*3/uL (ref 4.0–10.5)
nRBC: 0 % (ref 0.0–0.2)

## 2018-04-19 LAB — BASIC METABOLIC PANEL
Anion gap: 6 (ref 5–15)
BUN: 13 mg/dL (ref 6–20)
CO2: 23 mmol/L (ref 22–32)
Calcium: 8.6 mg/dL — ABNORMAL LOW (ref 8.9–10.3)
Chloride: 111 mmol/L (ref 98–111)
Creatinine, Ser: 0.42 mg/dL — ABNORMAL LOW (ref 0.44–1.00)
GFR calc Af Amer: >60 mL/min (ref >60)
GFR calc non Af Amer: >60 mL/min (ref >60)
Glucose, Bld: 91 mg/dL (ref 70–99)
Potassium: 4.1 mmol/L (ref 3.5–5.1)
Sodium: 140 mmol/L (ref 135–145)

## 2018-04-19 LAB — PROCALCITONIN: Procalcitonin: 0.33 ng/mL

## 2018-04-19 MED ORDER — ONDANSETRON HCL 4 MG/2ML IJ SOLN: 4.0000 mg | Freq: Four times a day (QID) | INTRAMUSCULAR | Status: DC | PRN

## 2018-04-19 NOTE — Discharge Summary (Addendum)
Physician Discharge Summary  Melissa Pitts ZOX:[id number] DOB: 1960/05/25 DOA: 04/18/2018  PCP: Assunta Found, MD  Admit date: 04/18/2018 Discharge date: 04/20/2018  Admitted From: Home Disposition:  Home  Recommendations for Outpatient Follow-up:  1. Follow up with PCP in 1-2 weeks 2. Please obtain BMP/CBC in one week    Discharge Condition: Stable CODE STATUS: FULL Diet recommendation: Regular   Brief/Interim Summary: 58 y.o.femalewith medical history ofgastric bypass 2015, depression, hypothyroidism presented with right lower quadrant abdominal pain that began around 3 PM on 04/17/2018. The patient tried taking some Gas-X without relief. The pain was constant and became severe. On the morning of 04/18/2018, the patient stated that the pain was unrelenting. This resulted in her coming to the emergency department for further evaluation. The patient has been subjective fevers and chills. She had some nausea but denied any emesis.  Upon presentation, the patient had a temperature up to 100.7 F, but was hemodynamically stable. WBC was 14.0 with hemoglobin 14.7 and platelets 199,000. BMP and LFTs were unremarkable. CT of the abdomen and pelvis showed 3 nonobstructive right renal stones. There was stranding in the bilateral kidneys, right greater than the left but a small volume perinephric fluid. There is no pneumoperitoneum. There was cholelithiasis without cholecystitis.  The patient was started on intravenous ceftriaxone empirically pending final culture data.  She improved clinically.   Discharge Diagnoses:  Sepsis -Present at the time of admission -Lactic acid peaked 2.26>>>1.0 -Check procalcitonin 0.13>>>0.33 -Source is pyelonephritis -Follow urine cultures--E.Coli -Blood cultures x2 sets--unfortunately, this was obtained after the patient received ceftriaxone--remains neg -Continued IV fluids -sepsis physiology resolved  Pyelonephritis--EColi -Continue  ceftriaxone pending culture data -d/c home with cipro X 8 more days to complete 10-day course  Hypothyroidism -Continue Synthroid  Depression -Continue Lexapro    Discharge Instructions   Allergies as of 04/20/2018      Reactions   Sulfamethoxazole Swelling   Penicillins Rash, Swelling   Sulfur Rash      Medication List    TAKE these medications   B-12 SL Place 1 tablet under the tongue daily.   CALCIUM PO Take 1 tablet by mouth 2 (two) times daily.   cholecalciferol 25 MCG (1000 UT) tablet Commonly known as:  VITAMIN D3 Take 1,000 Units by mouth daily.   ciprofloxacin 500 MG tablet Commonly known as:  CIPRO Take 1 tablet (500 mg total) by mouth 2 (two) times daily.   escitalopram 20 MG tablet Commonly known as:  LEXAPRO Take 20 mg by mouth at bedtime.   levothyroxine 50 MCG tablet Commonly known as:  SYNTHROID, LEVOTHROID Take 1 tablet by mouth every evening.   multivitamin with minerals Tabs tablet Take 1 tablet by mouth daily.   promethazine 12.5 MG tablet Commonly known as:  PHENERGAN Take 1 tablet (12.5 mg total) by mouth every 8 (eight) hours as needed for nausea or vomiting.       Allergies  Allergen Reactions  . Sulfamethoxazole Swelling  . Penicillins Rash and Swelling  . Sulfur Rash    Consultations:  none   Procedures/Studies: Ct Renal Stone Study  Result Date: 04/18/2018 CLINICAL DATA:  Onset right lower quadrant pain and hematuria yesterday. EXAM: CT ABDOMEN AND PELVIS WITHOUT CONTRAST TECHNIQUE: Multidetector CT imaging of the abdomen and pelvis was performed following the standard protocol without IV contrast. COMPARISON:  CT abdomen and pelvis 08/26/2013. FINDINGS: Lower chest: Lung bases clear.  No pleural or pericardial effusion. Hepatobiliary: 2.6 cm stone is seen in the gallbladder, unchanged.  No CT evidence of cholecystitis. The liver appears normal. Pancreas: Unremarkable. No pancreatic ductal dilatation or surrounding  inflammatory changes. Spleen: Normal in size without focal abnormality. Adrenals/Urinary Tract: The adrenal glands appear normal. Three stones are seen in the right kidney. The largest is in the lower pole and measures 1 cm in diameter. There is no right hydronephrosis and no ureteral stone is identified. No left renal stones are seen. There is some stranding about both kidneys, much greater on the right where there is also a small volume of perinephric fluid. Stomach/Bowel: The patient is status post gastric bypass. Small hiatal hernia is seen. Stomach and small bowel are otherwise unremarkable. The colon appears normal. The appendix is not seen and may have been removed. No evidence of appendicitis. Vascular/Lymphatic: No significant vascular findings are present. No enlarged abdominal or pelvic lymph nodes. Reproductive: Status post hysterectomy. No adnexal masses. Other: None. Musculoskeletal: No acute or focal abnormality. Degenerative disc disease L4-5 noted. IMPRESSION: Negative for hydronephrosis. The patient has 3 nonobstructing right renal stones. The largest measures 1 cm in diameter. Bilateral perinephric stranding is much greater on the right. While nonspecific, this could be secondary to pyelonephritis. 2.6 cm gallstone without CT evidence of cholecystitis. Small hiatal hernia. Status post gastric bypass and hysterectomy. Electronically Signed   By: Drusilla Kannerhomas  Dalessio M.D.   On: 04/18/2018 14:02        Discharge Exam: Vitals:   04/19/18 2157 04/20/18 0631  BP: 128/64 114/66  Pulse: 75 73  Resp: 18 17  Temp: 98.4 F (36.9 C) 98.2 F (36.8 C)  SpO2: 98% 99%   Vitals:   04/18/18 2200 04/19/18 0530 04/19/18 2157 04/20/18 0631  BP:  121/61 128/64 114/66  Pulse:  74 75 73  Resp:  18 18 17   Temp: (!) 100.9 F (38.3 C) 98.3 F (36.8 C) 98.4 F (36.9 C) 98.2 F (36.8 C)  TempSrc: Axillary   Oral  SpO2:  97% 98% 99%  Weight:      Height:        General: Pt is alert, awake, not in  acute distress Cardiovascular: RRR, S1/S2 +, no rubs, no gallops Respiratory: CTA bilaterally, no wheezing, no rhonchi Abdominal: Soft, NT, ND, bowel sounds + Extremities: no edema, no cyanosis   The results of significant diagnostics from this hospitalization (including imaging, microbiology, ancillary and laboratory) are listed below for reference.    Significant Diagnostic Studies: Ct Renal Stone Study  Result Date: 04/18/2018 CLINICAL DATA:  Onset right lower quadrant pain and hematuria yesterday. EXAM: CT ABDOMEN AND PELVIS WITHOUT CONTRAST TECHNIQUE: Multidetector CT imaging of the abdomen and pelvis was performed following the standard protocol without IV contrast. COMPARISON:  CT abdomen and pelvis 08/26/2013. FINDINGS: Lower chest: Lung bases clear.  No pleural or pericardial effusion. Hepatobiliary: 2.6 cm stone is seen in the gallbladder, unchanged. No CT evidence of cholecystitis. The liver appears normal. Pancreas: Unremarkable. No pancreatic ductal dilatation or surrounding inflammatory changes. Spleen: Normal in size without focal abnormality. Adrenals/Urinary Tract: The adrenal glands appear normal. Three stones are seen in the right kidney. The largest is in the lower pole and measures 1 cm in diameter. There is no right hydronephrosis and no ureteral stone is identified. No left renal stones are seen. There is some stranding about both kidneys, much greater on the right where there is also a small volume of perinephric fluid. Stomach/Bowel: The patient is status post gastric bypass. Small hiatal hernia is seen. Stomach and small bowel are  otherwise unremarkable. The colon appears normal. The appendix is not seen and may have been removed. No evidence of appendicitis. Vascular/Lymphatic: No significant vascular findings are present. No enlarged abdominal or pelvic lymph nodes. Reproductive: Status post hysterectomy. No adnexal masses. Other: None. Musculoskeletal: No acute or focal  abnormality. Degenerative disc disease L4-5 noted. IMPRESSION: Negative for hydronephrosis. The patient has 3 nonobstructing right renal stones. The largest measures 1 cm in diameter. Bilateral perinephric stranding is much greater on the right. While nonspecific, this could be secondary to pyelonephritis. 2.6 cm gallstone without CT evidence of cholecystitis. Small hiatal hernia. Status post gastric bypass and hysterectomy. Electronically Signed   By: Drusilla Kannerhomas  Dalessio M.D.   On: 04/18/2018 14:02     Microbiology: Recent Results (from the past 240 hour(s))  Urine culture     Status: Abnormal   Collection Time: 04/18/18 11:28 AM  Result Value Ref Range Status   Specimen Description   Final    URINE, CLEAN CATCH Performed at [facility], [address] [place], [place] [address]    Special Requests   Final    NONE Performed at [facility], [address] [place], [place] [address]    Culture >=100,000 COLONIES/mL ESCHERICHIA COLI (A)  Final   Report Status 04/20/2018 FINAL  Final   Organism ID, Bacteria ESCHERICHIA COLI (A)  Final      Susceptibility   Escherichia coli - MIC*    AMPICILLIN >=32 RESISTANT Resistant     CEFAZOLIN <=4 SENSITIVE Sensitive     CEFTRIAXONE <=1 SENSITIVE Sensitive     CIPROFLOXACIN <=0.25 SENSITIVE Sensitive     GENTAMICIN <=1 SENSITIVE Sensitive     IMIPENEM <=0.25 SENSITIVE Sensitive     NITROFURANTOIN <=16 SENSITIVE Sensitive     TRIMETH/SULFA <=20 SENSITIVE Sensitive     AMPICILLIN/SULBACTAM >=32 RESISTANT Resistant     PIP/TAZO <=4 SENSITIVE Sensitive     Extended ESBL NEGATIVE Sensitive     * >=100,000 COLONIES/mL ESCHERICHIA COLI  Culture, blood (Routine X 2) w Reflex to ID Panel     Status: None (Preliminary result)   Collection Time: 04/18/18  4:33 PM  Result Value Ref Range Status   Specimen Description BLOOD LEFT HAND  Final   Special Requests   Final    BOTTLES DRAWN AEROBIC AND ANAEROBIC Blood Culture adequate volume   Culture   Final     NO GROWTH 2 DAYS Performed at [facility], [address] [place], [place] [address]    Report Status PENDING  Incomplete  Culture, blood (Routine X 2) w Reflex to ID Panel     Status: None (Preliminary result)   Collection Time: 04/18/18  4:33 PM  Result Value Ref Range Status   Specimen Description BLOOD RIGHT HAND  Final   Special Requests   Final    BOTTLES DRAWN AEROBIC AND ANAEROBIC Blood Culture adequate volume   Culture   Final    NO GROWTH 2 DAYS Performed at [facility], [address] [place], [place] [address]    Report Status PENDING  Incomplete     Labs: Basic Metabolic Panel: Recent Labs  Lab 04/18/18 1120 04/19/18 0445  NA 138 140  K 3.7 4.1  CL 101 111  CO2 26 23  GLUCOSE 122* 91  BUN 17 13  CREATININE 0.78 0.42*  CALCIUM 9.3 8.6*   Liver Function Tests: Recent Labs  Lab 04/18/18 1120  AST 19  ALT 19  ALKPHOS 94  BILITOT 0.8  PROT  7.6  ALBUMIN 4.3   No results for input(s): LIPASE, AMYLASE in the last 168 hours. No results for input(s): AMMONIA in the last 168 hours. CBC: Recent Labs  Lab 04/18/18 1120 04/19/18 0445 04/20/18 0519  WBC 14.0* 10.5 7.6  NEUTROABS 11.6*  --   --   HGB 14.7 12.0 11.9*  HCT 45.4 36.6 36.1  MCV 96.2 96.3 96.0  PLT 199 160 154   Cardiac Enzymes: No results for input(s): CKTOTAL, CKMB, CKMBINDEX, TROPONINI in the last 168 hours. BNP: Invalid input(s): POCBNP CBG: No results for input(s): GLUCAP in the last 168 hours.  Time coordinating discharge:  36 minutes  Signed:  Catarina Hartshorn, DO [facility] Pager: [phone] 04/20/2018, 10:01 AM

## 2018-04-19 NOTE — Progress Notes (Signed)
PROGRESS NOTE  Melissa QuarryJanet R Weese ZOX:[id number] DOB: 09/26/1960 DOA: 04/18/2018 PCP: [name], [name], MD  Brief History:   58 y.o. female with medical history of gastric bypass 2015, depression, hypothyroidism presented with right lower quadrant abdominal pain that began around 3 PM on 04/17/2018.  The patient tried taking some Gas-X without relief.  The pain was constant and became severe.  On the morning of 04/18/2018, the patient stated that the pain was unrelenting.  This resulted in her coming to the emergency department for further evaluation.  The patient has been subjective fevers and chills.  She had some nausea but denied any emesis.  Upon presentation, the patient had a temperature up to 100.7 F, but was hemodynamically stable.  WBC was 14.0 with hemoglobin 14.7 and platelets 199,000.  BMP and LFTs were unremarkable.  CT of the abdomen and pelvis showed 3 nonobstructive right renal stones.  There was stranding in the bilateral kidneys, right greater than the left but a small volume perinephric fluid.  There is no pneumoperitoneum.  There was cholelithiasis without cholecystitis.  Assessment/Plan:  Sepsis -Present at the time of admission -Lactic acid peaked 2.26 -Check procalcitonin 0.13>>>0.33 -Source is likely pyelonephritis -Follow urine cultures -Blood cultures x2 sets--unfortunately, this was obtained after the patient received ceftriaxone -Continue IV fluids  Pyelonephritis--GNR -Continue ceftriaxone pending culture data  Hypothyroidism -Continue Synthroid  Depression -Continue Lexapro     Disposition Plan:   Home 04/20/18 if stable and culture final Family Communication: No  Family at bedside  Consultants:  none  Code Status:  FULL   DVT Prophylaxis: [place] Lovenoxr   Procedures: As Listed in Progress Note Above  Antibiotics: Ceftriaxone 1/15>>>       Subjective: Patient denies fevers, chills, headache, chest pain, dyspnea, nausea, vomiting,  diarrhea, abdominal pain, dysuria, hematuria, hematochezia, and melena. abd pain is improving   Objective: Vitals:   04/18/18 1930 04/18/18 2005 04/18/18 2200 04/19/18 0530  BP: (!) 121/92 134/62  121/61  Pulse: 89 83  74  Resp:  16  18  Temp:  98.9 F (37.2 C) (!) 100.9 F (38.3 C) 98.3 F (36.8 C)  TempSrc:   Axillary   SpO2: 99% 99%  97%  Weight:  74.9 kg    Height:  5\' 3"  (1.6 m)      Intake/Output Summary (Last 24 hours) at 04/19/2018 1440 Last data filed at 04/19/2018 0104 Gross per 24 hour  Intake 623.41 ml  Output -  Net 623.41 ml   Weight change:  Exam:   General:  Pt is alert, follows commands appropriately, not in acute distress  HEENT: No icterus, No thrush, No neck mass, [place]/AT  Cardiovascular: RRR, S1/S2, no rubs, no gallops  Respiratory: CTA bilaterally, no wheezing, no crackles, no rhonchi  Abdomen: Soft/+BS, non tender, non distended, no guarding  Extremities: No edema, No lymphangitis, No petechiae, No rashes, no synovitis   Data Reviewed: I have personally reviewed following labs and imaging studies Basic Metabolic Panel: Recent Labs  Lab 04/18/18 1120 04/19/18 0445  NA 138 140  K 3.7 4.1  CL 101 111  CO2 26 23  GLUCOSE 122* 91  BUN 17 13  CREATININE 0.78 0.42*  CALCIUM 9.3 8.6*   Liver Function Tests: Recent Labs  Lab 04/18/18 1120  AST 19  ALT 19  ALKPHOS 94  BILITOT 0.8  PROT 7.6  ALBUMIN 4.3   No results for input(s): LIPASE, AMYLASE in the last  168 hours. No results for input(s): AMMONIA in the last 168 hours. Coagulation Profile: Recent Labs  Lab 04/18/18 1633  INR 1.12   CBC: Recent Labs  Lab 04/18/18 1120 04/19/18 0445  WBC 14.0* 10.5  NEUTROABS 11.6*  --   HGB 14.7 12.0  HCT 45.4 36.6  MCV 96.2 96.3  PLT 199 160   Cardiac Enzymes: No results for input(s): CKTOTAL, CKMB, CKMBINDEX, TROPONINI in the last 168 hours. BNP: Invalid input(s): POCBNP CBG: No results for input(s): GLUCAP in the last 168  hours. HbA1C: No results for input(s): HGBA1C in the last 72 hours. Urine analysis:    Component Value Date/Time   COLORURINE YELLOW 04/18/2018 1128   APPEARANCEUR CLOUDY (A) 04/18/2018 1128   LABSPEC 1.015 04/18/2018 1128   PHURINE 5.0 04/18/2018 1128   GLUCOSEU NEGATIVE 04/18/2018 1128   HGBUR MODERATE (A) 04/18/2018 1128   BILIRUBINUR NEGATIVE 04/18/2018 1128   BILIRUBINUR small 05/04/2013 1443   KETONESUR NEGATIVE 04/18/2018 1128   PROTEINUR 30 (A) 04/18/2018 1128   UROBILINOGEN 0.2 05/04/2013 1443   UROBILINOGEN 0.2 11/21/2012 0536   NITRITE POSITIVE (A) 04/18/2018 1128   LEUKOCYTESUR LARGE (A) 04/18/2018 1128   Sepsis Labs: @LABRCNTIP (procalcitonin:4,lacticidven:4) ) Recent Results (from the past 240 hour(s))  Urine culture     Status: Abnormal (Preliminary result)   Collection Time: 04/18/18 11:28 AM  Result Value Ref Range Status   Specimen Description   Final    URINE, CLEAN CATCH Performed at [facility], [address] [place], [place] [address]    Special Requests   Final    NONE Performed at [facility], [address] [place], [place] [address]    Culture >=100,000 COLONIES/mL GRAM NEGATIVE RODS (A)  Final   Report Status PENDING  Incomplete  Culture, blood (Routine X 2) w Reflex to ID Panel     Status: None (Preliminary result)   Collection Time: 04/18/18  4:33 PM  Result Value Ref Range Status   Specimen Description BLOOD LEFT HAND  Final   Special Requests   Final    BOTTLES DRAWN AEROBIC AND ANAEROBIC Blood Culture adequate volume   Culture   Final    NO GROWTH < 24 HOURS Performed at [facility], [address] [place], [place] [address]    Report Status PENDING  Incomplete  Culture, blood (Routine X 2) w Reflex to ID Panel     Status: None (Preliminary result)   Collection Time: 04/18/18  4:33 PM  Result Value Ref Range Status   Specimen Description BLOOD RIGHT HAND  Final   Special Requests   Final    BOTTLES DRAWN AEROBIC AND ANAEROBIC  Blood Culture adequate volume   Culture   Final    NO GROWTH < 24 HOURS Performed at [facility], [address] [place], [place] [address]    Report Status PENDING  Incomplete     Scheduled Meds: . cholecalciferol  1,000 Units Oral Daily  . enoxaparin (LOVENOX) injection  40 mg Subcutaneous Q24H  . escitalopram  20 mg Oral QHS  . multivitamin with minerals  1 tablet Oral Daily   Continuous Infusions: . 0.9 % NaCl with KCl 20 mEq / L 100 mL/hr at 04/19/18 1222  . cefTRIAXone (ROCEPHIN)  IV      Procedures/Studies: Ct Renal Stone Study  Result Date: 04/18/2018 CLINICAL DATA:  Onset right lower quadrant pain and hematuria yesterday. EXAM: CT ABDOMEN AND PELVIS WITHOUT CONTRAST TECHNIQUE: Multidetector CT imaging of the abdomen and pelvis  was performed following the standard protocol without IV contrast. COMPARISON:  CT abdomen and pelvis 08/26/2013. FINDINGS: Lower chest: Lung bases clear.  No pleural or pericardial effusion. Hepatobiliary: 2.6 cm stone is seen in the gallbladder, unchanged. No CT evidence of cholecystitis. The liver appears normal. Pancreas: Unremarkable. No pancreatic ductal dilatation or surrounding inflammatory changes. Spleen: Normal in size without focal abnormality. Adrenals/Urinary Tract: The adrenal glands appear normal. Three stones are seen in the right kidney. The largest is in the lower pole and measures 1 cm in diameter. There is no right hydronephrosis and no ureteral stone is identified. No left renal stones are seen. There is some stranding about both kidneys, much greater on the right where there is also a small volume of perinephric fluid. Stomach/Bowel: The patient is status post gastric bypass. Small hiatal hernia is seen. Stomach and small bowel are otherwise unremarkable. The colon appears normal. The appendix is not seen and may have been removed. No evidence of appendicitis. Vascular/Lymphatic: No significant vascular findings are present. No enlarged  abdominal or pelvic lymph nodes. Reproductive: Status post hysterectomy. No adnexal masses. Other: None. Musculoskeletal: No acute or focal abnormality. Degenerative disc disease L4-5 noted. IMPRESSION: Negative for hydronephrosis. The patient has 3 nonobstructing right renal stones. The largest measures 1 cm in diameter. Bilateral perinephric stranding is much greater on the right. While nonspecific, this could be secondary to pyelonephritis. 2.6 cm gallstone without CT evidence of cholecystitis. Small hiatal hernia. Status post gastric bypass and hysterectomy. Electronically Signed   By: Drusilla Kanner M.D.   On: 04/18/2018 14:02    Catarina Hartshorn, DO  [facility] Pager [phone]  If 7PM-7AM, please contact night-coverage [email or web address] Password [email or web address] 04/19/2018, 2:40 PM   LOS: 1 day

## 2018-04-20 DIAGNOSIS — A4151 Sepsis due to Escherichia coli [E. coli]: Secondary | ICD-10-CM

## 2018-04-20 LAB — CBC
HCT: 36.1 % (ref 36.0–46.0)
Hemoglobin: 11.9 g/dL — ABNORMAL LOW (ref 12.0–15.0)
MCH: 31.6 pg (ref 26.0–34.0)
MCHC: 33 g/dL (ref 30.0–36.0)
MCV: 96 fL (ref 80.0–100.0)
Platelets: 154 10*3/uL (ref 150–400)
RBC: 3.76 MIL/uL — ABNORMAL LOW (ref 3.87–5.11)
RDW: 13.3 % (ref 11.5–15.5)
WBC: 7.6 10*3/uL (ref 4.0–10.5)
nRBC: 0 % (ref 0.0–0.2)

## 2018-04-20 LAB — URINE CULTURE: Culture: >=100000 — AB

## 2018-04-20 LAB — HIV ANTIBODY (ROUTINE TESTING W REFLEX): HIV Screen 4th Generation wRfx: NONREACTIVE

## 2018-04-20 LAB — PROCALCITONIN: Procalcitonin: 0.21 ng/mL

## 2018-04-20 MED ORDER — CIPROFLOXACIN HCL 500 MG PO TABS: 500.0000 mg | ORAL_TABLET | Freq: Two times a day (BID) | ORAL | 0 refills | Status: DC

## 2018-04-20 MED ORDER — CIPROFLOXACIN HCL 250 MG PO TABS
500.0000 mg | ORAL_TABLET | Freq: Two times a day (BID) | ORAL | Status: DC
  Administered 2018-04-20: 500 mg via ORAL
  Filled 2018-04-20: qty 2

## 2018-04-20 NOTE — Plan of Care (Signed)
  Problem: Education: Goal: Knowledge of General Education information will improve Description Including pain rating scale, medication(s)/side effects and non-pharmacologic comfort measures 04/20/2018 1019 by Darreld Mclean, RN Outcome: Adequate for Discharge 04/20/2018 0845 by Darreld Mclean, RN Outcome: Progressing   Problem: Health Behavior/Discharge Planning: Goal: Ability to manage health-related needs will improve 04/20/2018 1019 by Darreld Mclean, RN Outcome: Adequate for Discharge 04/20/2018 0845 by Darreld Mclean, RN Outcome: Progressing   Problem: Clinical Measurements: Goal: Ability to maintain clinical measurements within normal limits will improve 04/20/2018 1019 by Darreld Mclean, RN Outcome: Adequate for Discharge 04/20/2018 0845 by Darreld Mclean, RN Outcome: Progressing Goal: Will remain free from infection 04/20/2018 1019 by Darreld Mclean, RN Outcome: Adequate for Discharge 04/20/2018 0845 by Darreld Mclean, RN Outcome: Progressing Goal: Diagnostic test results will improve 04/20/2018 1019 by Darreld Mclean, RN Outcome: Adequate for Discharge 04/20/2018 0845 by Darreld Mclean, RN Outcome: Progressing Goal: Respiratory complications will improve 04/20/2018 1019 by Darreld Mclean, RN Outcome: Adequate for Discharge 04/20/2018 0845 by Darreld Mclean, RN Outcome: Progressing Goal: Cardiovascular complication will be avoided 04/20/2018 1019 by Darreld Mclean, RN Outcome: Adequate for Discharge 04/20/2018 0845 by Darreld Mclean, RN Outcome: Progressing   Problem: Activity: Goal: Risk for activity intolerance will decrease 04/20/2018 1019 by Darreld Mclean, RN Outcome: Adequate for Discharge 04/20/2018 0845 by Darreld Mclean, RN Outcome: Progressing   Problem: Nutrition: Goal: Adequate nutrition will be maintained 04/20/2018 1019 by Darreld Mclean, RN Outcome: Adequate for Discharge 04/20/2018 0845 by Darreld Mclean, RN Outcome: Progressing   Problem: Coping: Goal: Level of anxiety will decrease 04/20/2018 1019 by Darreld Mclean, RN Outcome: Adequate for  Discharge 04/20/2018 0845 by Darreld Mclean, RN Outcome: Progressing   Problem: Elimination: Goal: Will not experience complications related to bowel motility 04/20/2018 1019 by Darreld Mclean, RN Outcome: Adequate for Discharge 04/20/2018 0845 by Darreld Mclean, RN Outcome: Progressing Goal: Will not experience complications related to urinary retention 04/20/2018 1019 by Darreld Mclean, RN Outcome: Adequate for Discharge 04/20/2018 0845 by Darreld Mclean, RN Outcome: Progressing   Problem: Pain Managment: Goal: General experience of comfort will improve 04/20/2018 1019 by Darreld Mclean, RN Outcome: Adequate for Discharge 04/20/2018 0845 by Darreld Mclean, RN Outcome: Progressing   Problem: Safety: Goal: Ability to remain free from injury will improve 04/20/2018 1019 by Darreld Mclean, RN Outcome: Adequate for Discharge 04/20/2018 0845 by Darreld Mclean, RN Outcome: Progressing   Problem: Skin Integrity: Goal: Risk for impaired skin integrity will decrease 04/20/2018 1019 by Darreld Mclean, RN Outcome: Adequate for Discharge 04/20/2018 0845 by Darreld Mclean, RN Outcome: Progressing   Problem: Urinary Elimination: Goal: Signs and symptoms of infection will decrease 04/20/2018 1019 by Darreld Mclean, RN Outcome: Adequate for Discharge 04/20/2018 0845 by Darreld Mclean, RN Outcome: Progressing

## 2018-04-20 NOTE — Plan of Care (Signed)

## 2018-04-22 ENCOUNTER — Emergency Department (HOSPITAL_COMMUNITY)
Admission: EM | Admit: 2018-04-22 | Discharge: 2018-04-22 | Disposition: A | Discharge status: Home / Self Care | Payer: Federal, State, Local not specified - PPO | Attending: Emergency Medicine | Admitting: Emergency Medicine

## 2018-04-22 ENCOUNTER — Encounter (HOSPITAL_COMMUNITY): Payer: Self-pay | Admitting: Emergency Medicine

## 2018-04-22 ENCOUNTER — Other Ambulatory Visit: Payer: Self-pay

## 2018-04-22 DIAGNOSIS — I10 Essential (primary) hypertension: Secondary | ICD-10-CM | POA: Diagnosis not present

## 2018-04-22 DIAGNOSIS — R1031 Right lower quadrant pain: Secondary | ICD-10-CM | POA: Diagnosis not present

## 2018-04-22 DIAGNOSIS — N200 Calculus of kidney: Secondary | ICD-10-CM | POA: Diagnosis not present

## 2018-04-22 DIAGNOSIS — E039 Hypothyroidism, unspecified: Secondary | ICD-10-CM | POA: Insufficient documentation

## 2018-04-22 DIAGNOSIS — Z79899 Other long term (current) drug therapy: Secondary | ICD-10-CM | POA: Diagnosis not present

## 2018-04-22 LAB — URINALYSIS, ROUTINE W REFLEX MICROSCOPIC
Bacteria, UA: NONE SEEN
Bilirubin Urine: NEGATIVE
Glucose, UA: NEGATIVE mg/dL
Ketones, ur: NEGATIVE mg/dL
Leukocytes, UA: NEGATIVE
Nitrite: NEGATIVE
Protein, ur: NEGATIVE mg/dL
RBC / HPF: >50 RBC/hpf — ABNORMAL HIGH (ref 0–5)
Specific Gravity, Urine: 1.016 (ref 1.005–1.030)
pH: 5 (ref 5.0–8.0)

## 2018-04-22 MED ORDER — ONDANSETRON 4 MG PO TBDP: 4.0000 mg | ORAL_TABLET | Freq: Three times a day (TID) | ORAL | 0 refills | Status: DC | PRN

## 2018-04-22 MED ORDER — HYDROCODONE-ACETAMINOPHEN 5-325 MG PO TABS: 2.0000 | ORAL_TABLET | ORAL | 0 refills | Status: DC | PRN

## 2018-04-22 MED ORDER — OXYCODONE-ACETAMINOPHEN 5-325 MG PO TABS
2.0000 | ORAL_TABLET | Freq: Once | ORAL | Status: AC
  Administered 2018-04-22: 2 via ORAL
  Filled 2018-04-22: qty 2

## 2018-04-22 MED ORDER — IBUPROFEN 600 MG PO TABS: 600.0000 mg | ORAL_TABLET | Freq: Four times a day (QID) | ORAL | 0 refills | Status: DC | PRN

## 2018-04-22 NOTE — Discharge Instructions (Signed)
You have kidney stones which appear large and in the kidney - however, you do have some blood in your urine which may indicate that one of the stones is making its' way out. Please finish your cipro antibiotics for the infection as prescribed by the doctors when you left the hospital Please use the medication for pain including ibuprofen every 8 hours as needed, Zofran as needed for nausea and hydrocodone only for severe pain Please call the office of the urologist as listed above to make an expedited follow-up If you should have ongoing severe or worsening symptoms please return to the emergency department for further evaluation.

## 2018-04-22 NOTE — ED Triage Notes (Signed)
Pt here for UTI and kidney stones, admitted and d/c 2 days ago. Pt stats symtpoms started back up wit right flank/groin pain today. Pt mildy restless in triage. Nausea. Denies v/d.

## 2018-04-22 NOTE — ED Notes (Signed)
Pt ambulatory to waiting room. Pt verbalized understanding of discharge instructions.   

## 2018-04-22 NOTE — ED Provider Notes (Signed)
[facility] Provider Note   CSN: [id number] Arrival date & time: 04/22/18  1847     History   Chief Complaint Chief Complaint  Patient presents with  . Flank Pain    HPI Melissa Pitts is a 58 y.o. female.  HPI  58 year old female, she has a history of recent admission to the hospital for pyelonephritis, she had a urinary culture showing E. coli greater than 100,000 colony-forming units that was sensitive to almost everything.  She was started on ciprofloxacin which was appropriate given the urinary culture results.  She has taken this medication twice a day for the last 4 days and has felt better until today earlier when she developed acute onset of right sided flank pain and right side pain radiating into the right groin.  There is mild nausea but this, the pain comes and goes, it is intermittent, it is colicky in nature.  She is not having any fevers or chills.  She reports that she has seen small amounts of sediment in her urine as well.  Past Medical History:  Diagnosis Date  . Allergy   . Breast mass   . Depression   . Hypertension   . Sleep apnea    wears CPAP  . Thyroid disease    hypothyroid    Patient Active Problem List   Diagnosis Date Noted  . E. coli sepsis (HCC) 04/20/2018  . Sepsis due to undetermined organism (HCC) 04/18/2018  . Acute pyelonephritis 04/18/2018  . Hypothyroidism (acquired) 04/18/2018  . Anastomotic ulcer 02/08/2013  . Hypomagnesemia 01/01/2013  . SIRS (systemic inflammatory response syndrome) (HCC) 12/24/2012  . 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 Bariatric surgery status 11/21/2012  . Clinical depression 06/25/2012  . Hypothyroidism 06/25/2012    Past Surgical History:  Procedure Laterality Date  . bypass leak repair  11/21/12  . GASTRIC BYPASS  11/14/12  . HERNIA REPAIR  5/15  . VAGINAL HYSTERECTOMY  8/07     OB History    Gravida  2   Para  1   Term      Preterm      AB  1   Living  1     SAB  1   TAB      Ectopic      Multiple      Live Births               Home Medications    Prior to Admission medications   Medication Sig Start Date End Date Taking? Authorizing Provider  CALCIUM PO Take 1 tablet by mouth 2 (two) times daily.   Yes [provider]  cholecalciferol (VITAMIN D3) 25 MCG (1000 UT) tablet Take 1,000 Units by mouth daily.   Yes [provider]  ciprofloxacin (CIPRO) 500 MG tablet Take 1 tablet (500 mg total) by mouth 2 (two) times daily. 04/20/18  Yes Tat, Onalee Huaavid, MD  Cyanocobalamin (B-12 SL) Place 1 tablet under the tongue daily.   Yes [provider]  escitalopram (LEXAPRO) 20 MG tablet Take 20 mg by mouth at bedtime.  05/29/13  Yes [provider]  levothyroxine (SYNTHROID, LEVOTHROID) 50 MCG tablet Take 1 tablet by mouth every evening.  03/07/11  Yes [provider]  Multiple Vitamin (MULTIVITAMIN WITH MINERALS) TABS tablet Take 1 tablet by mouth daily.   Yes [provider]  promethazine (PHENERGAN) 12.5 MG tablet Take 1 tablet (12.5 mg total) by mouth every 8 (eight) hours as needed for  nausea or vomiting. 10/09/17  Yes Jerene BearsMiller, Mary S, MD  HYDROcodone-acetaminophen (NORCO/VICODIN) 5-325 MG tablet Take 2 tablets by mouth every 4 (four) hours as needed for moderate pain. 04/22/18   Eber HongMiller, Shakiya Mcneary, MD  HYDROcodone-acetaminophen (NORCO/VICODIN) 5-325 MG tablet Take 2 tablets by mouth every 4 (four) hours as needed. 04/22/18   Eber HongMiller, Jedadiah Abdallah, MD  ibuprofen (ADVIL,MOTRIN) 600 MG tablet Take 1 tablet (600 mg total) by mouth every 6 (six) hours as needed. 04/22/18   Eber HongMiller, Nayquan Evinger, MD  ondansetron (ZOFRAN ODT) 4 MG disintegrating tablet Take 1 tablet (4 mg total) by mouth every 8 (eight) hours as needed for nausea. 04/22/18   Eber HongMiller, Shanique Aslinger, MD    Family History Family History  Problem Relation Age of Onset  . Hypertension Mother   . Breast cancer Mother   . Cancer Father   . Heart disease Father         bypass  . Diabetes Father   . Breast cancer Maternal Grandmother           . Diabetes Paternal Grandfather   . Congestive Heart Failure Maternal Aunt   . Colon cancer Neg Hx   . Esophageal cancer Neg Hx   . Rectal cancer Neg Hx   . Stomach cancer Neg Hx     Social History Social History   Tobacco Use  . Smoking status: Never Smoker  . Smokeless tobacco: Never Used  Substance Use Topics  . Alcohol use: No  . Drug use: No     Allergies   Sulfamethoxazole; Penicillins; and Sulfur   Review of Systems Review of Systems  All other systems reviewed and are negative.    Physical Exam Updated Vital Signs BP 129/68 (BP Location: Right Arm)   Pulse 70   Temp 98.2 F (36.8 C)   Resp 18   Ht 1.6 m (5\' 3" )   Wt 73.5 kg   LMP 04/04/2005   SpO2 99%   BMI 28.70 kg/m   Physical Exam Vitals signs and nursing note reviewed.  Constitutional:      General: She is not in acute distress.    Appearance: She is well-developed.  HENT:     Head: Normocephalic and atraumatic.     Mouth/Throat:     Pharynx: No oropharyngeal exudate.  Eyes:     General: No scleral icterus.       Right eye: No discharge.        Left eye: No discharge.     Conjunctiva/sclera: Conjunctivae normal.     Pupils: Pupils are equal, round, and reactive to light.  Neck:     Musculoskeletal: Normal range of motion and neck supple.     Thyroid: No thyromegaly.     Vascular: No JVD.  Cardiovascular:     Rate and Rhythm: Normal rate and regular rhythm.     Heart sounds: Normal heart sounds. No murmur. No friction rub. No gallop.   Pulmonary:     Effort: Pulmonary effort is normal. No respiratory distress.     Breath sounds: Normal breath sounds. No wheezing or rales.  Abdominal:     General: Bowel sounds are normal. There is no distension.     Palpations: Abdomen is soft. There is no mass.     Tenderness: There is no abdominal tenderness.  Musculoskeletal: Normal range of motion.        General: No  tenderness.  Lymphadenopathy:     Cervical: No cervical adenopathy.  Skin:    General: Skin  is warm and dry.     Findings: No erythema or rash.  Neurological:     Mental Status: She is alert.     Coordination: Coordination normal.  Psychiatric:        Behavior: Behavior normal.      ED Treatments / Results  Labs (all labs ordered are listed, but only abnormal results are displayed) Labs Reviewed  URINALYSIS, ROUTINE W REFLEX MICROSCOPIC - Abnormal; Notable for the following components:      Result Value   APPearance HAZY (*)    Hgb urine dipstick LARGE (*)    RBC / HPF >50 (*)    All other components within normal limits    EKG None  Radiology No results found.  Procedures Ultrasound ED Renal Date/Time: 04/22/2018 8:07 PM Performed by: Eber Hong, MD Authorized by: Eber Hong, MD   Procedure details:    Indications comment:  Flank Pain   Technique:  R kidney, L kidney and bladderImages: archivedStudy Limitations: body habitus Left kidney findings:    Mass: not identified     Nephrolithiasis: not identified     Renal stones: not identified     Ureteral jets: not identified     Intra-abdominal fluid: not identified     Perinephric fluid: not identified     Hydronephrosis: none   Right kidney findings:    Mass: not identified     Renal stones: identified     Ureteral jets: not identified     Intra-abdominal fluid: not identified     Perinephric fluid: not identified     Hydronephrosis: mild   Bladder findings:    Bladder:  Visualized   Free pelvic fluid: not identified     (including critical care time)  Medications Ordered in ED Medications  oxyCODONE-acetaminophen (PERCOCET/ROXICET) 5-325 MG per tablet 2 tablet (2 tablets Oral Given 04/22/18 1914)     Initial Impression / Assessment and Plan / ED Course  I have reviewed the triage vital signs and the nursing notes.  Pertinent labs & imaging results that were available during my care of the  patient were reviewed by me and considered in my medical decision making (see chart for details).  Clinical Course as of Apr 23 2047  Wynelle Link Apr 22, 2018  2027 RBC's present - no bacteria - will treat as kidney stone - urine has cleared - needs close outpt f/u, and will give Urology phone number and some to go meds for the night.  Pt agreeable.   [BM]    Clinical Course User Index [BM] Eber Hong, MD   As you can see below the vital signs are totally normal with no fever tachycardia hypotension.  She has symptoms of colicky pain on the right side without any tenderness in the right upper quadrant suggesting this is not related to the gallstone that was seen on the prior CT scan.  I have reviewed the record and it showed that she did have 2 kidney stones in the right kidney both were not causing trouble and not causing ureteral dilation.  At this time she may have some progression of 1 of the stones.  We will do a bedside ultrasound to further evaluate however she appears to have a nonsurgical abdomen, she will be given some pain medication and a urinalysis will be repeated to rule out ongoing infection.  That being said she is on ciprofloxacin which is known to be susceptible based on prior culture  Hematuria on urinalysis, patient agreeable to  discharge, feels better after pain medicines, urology follow-up given, she is aware of the indications for return  Vitals:   04/22/18 1854  BP: 129/68  Pulse: 70  Resp: 18  Temp: 98.2 F (36.8 C)  SpO2: 99%     Final Clinical Impressions(s) / ED Diagnoses   Final diagnoses:  Nephrolithiasis    ED Discharge Orders         Ordered    HYDROcodone-acetaminophen (NORCO/VICODIN) 5-325 MG tablet  Every 4 hours PRN     04/22/18 2047    HYDROcodone-acetaminophen (NORCO/VICODIN) 5-325 MG tablet  Every 4 hours PRN     04/22/18 2047    ondansetron (ZOFRAN ODT) 4 MG disintegrating tablet  Every 8 hours PRN     04/22/18 2047    ibuprofen  (ADVIL,MOTRIN) 600 MG tablet  Every 6 hours PRN     04/22/18 2047           Eber Hong, MD 04/22/18 2049

## 2018-04-23 LAB — CULTURE, BLOOD (ROUTINE X 2)
Culture: NO GROWTH
Culture: NO GROWTH
Special Requests: ADEQUATE
Special Requests: ADEQUATE

## 2018-04-24 ENCOUNTER — Encounter (HOSPITAL_COMMUNITY): Payer: Self-pay | Admitting: General Practice

## 2018-04-24 ENCOUNTER — Other Ambulatory Visit: Payer: Self-pay | Admitting: Urology

## 2018-04-24 DIAGNOSIS — N200 Calculus of kidney: Secondary | ICD-10-CM | POA: Diagnosis not present

## 2018-04-24 MED FILL — Hydrocodone-Acetaminophen Tab 5-325 MG: ORAL | Qty: 6 | Status: AC

## 2018-04-26 ENCOUNTER — Encounter (HOSPITAL_COMMUNITY)
Admission: RE | Disposition: A | Discharge status: Home / Self Care | Payer: Self-pay | Source: Home / Self Care | Attending: Urology

## 2018-04-26 ENCOUNTER — Other Ambulatory Visit: Payer: Self-pay

## 2018-04-26 ENCOUNTER — Encounter (HOSPITAL_COMMUNITY): Payer: Self-pay | Admitting: *Deleted

## 2018-04-26 ENCOUNTER — Ambulatory Visit (HOSPITAL_COMMUNITY)
Admission: RE | Admit: 2018-04-26 | Discharge: 2018-04-26 | Disposition: A | Discharge status: Home / Self Care | Payer: Federal, State, Local not specified - PPO | Attending: Urology | Admitting: Urology

## 2018-04-26 ENCOUNTER — Ambulatory Visit (HOSPITAL_COMMUNITY): Payer: Federal, State, Local not specified - PPO

## 2018-04-26 DIAGNOSIS — N201 Calculus of ureter: Secondary | ICD-10-CM | POA: Diagnosis not present

## 2018-04-26 DIAGNOSIS — Z79890 Hormone replacement therapy (postmenopausal): Secondary | ICD-10-CM | POA: Diagnosis not present

## 2018-04-26 DIAGNOSIS — F329 Major depressive disorder, single episode, unspecified: Secondary | ICD-10-CM | POA: Insufficient documentation

## 2018-04-26 DIAGNOSIS — E039 Hypothyroidism, unspecified: Secondary | ICD-10-CM | POA: Diagnosis not present

## 2018-04-26 DIAGNOSIS — N202 Calculus of kidney with calculus of ureter: Secondary | ICD-10-CM | POA: Diagnosis not present

## 2018-04-26 DIAGNOSIS — Z79899 Other long term (current) drug therapy: Secondary | ICD-10-CM | POA: Insufficient documentation

## 2018-04-26 HISTORY — PX: EXTRACORPOREAL SHOCK WAVE LITHOTRIPSY: SHX1557

## 2018-04-26 HISTORY — DX: Personal history of urinary calculi: Z87.442

## 2018-04-26 HISTORY — DX: Hypothyroidism, unspecified: E03.9

## 2018-04-26 HISTORY — DX: Type 2 diabetes mellitus without complications: E11.9

## 2018-04-26 LAB — GLUCOSE, CAPILLARY: Glucose-Capillary: 96 mg/dL (ref 70–99)

## 2018-04-26 SURGERY — LITHOTRIPSY, ESWL
Anesthesia: LOCAL | Laterality: Right

## 2018-04-26 MED ORDER — SODIUM CHLORIDE 0.9 % IV SOLN
INTRAVENOUS | Status: DC
  Administered 2018-04-26: 09:00:00 via INTRAVENOUS

## 2018-04-26 MED ORDER — CIPROFLOXACIN HCL 500 MG PO TABS
500.0000 mg | ORAL_TABLET | ORAL | Status: AC
  Administered 2018-04-26: 500 mg via ORAL
  Filled 2018-04-26: qty 1

## 2018-04-26 MED ORDER — DIPHENHYDRAMINE HCL 25 MG PO CAPS
25.0000 mg | ORAL_CAPSULE | ORAL | Status: AC
  Administered 2018-04-26: 25 mg via ORAL
  Filled 2018-04-26: qty 1

## 2018-04-26 MED ORDER — DIAZEPAM 5 MG PO TABS
10.0000 mg | ORAL_TABLET | ORAL | Status: AC
  Administered 2018-04-26: 10 mg via ORAL
  Filled 2018-04-26: qty 2

## 2018-04-26 NOTE — Discharge Instructions (Signed)

## 2018-04-27 ENCOUNTER — Encounter (HOSPITAL_COMMUNITY): Payer: Self-pay | Admitting: Urology

## 2018-05-02 DIAGNOSIS — E663 Overweight: Secondary | ICD-10-CM | POA: Diagnosis not present

## 2018-05-02 DIAGNOSIS — K8020 Calculus of gallbladder without cholecystitis without obstruction: Secondary | ICD-10-CM | POA: Diagnosis not present

## 2018-05-02 DIAGNOSIS — Z1389 Encounter for screening for other disorder: Secondary | ICD-10-CM | POA: Diagnosis not present

## 2018-05-02 DIAGNOSIS — Z6828 Body mass index (BMI) 28.0-28.9, adult: Secondary | ICD-10-CM | POA: Diagnosis not present

## 2018-05-02 DIAGNOSIS — N202 Calculus of kidney with calculus of ureter: Secondary | ICD-10-CM | POA: Diagnosis not present

## 2018-05-08 DIAGNOSIS — K8020 Calculus of gallbladder without cholecystitis without obstruction: Secondary | ICD-10-CM | POA: Diagnosis not present

## 2018-05-14 DIAGNOSIS — N200 Calculus of kidney: Secondary | ICD-10-CM | POA: Diagnosis not present

## 2018-05-15 ENCOUNTER — Other Ambulatory Visit: Payer: Self-pay | Admitting: Urology

## 2018-05-16 DIAGNOSIS — N200 Calculus of kidney: Secondary | ICD-10-CM | POA: Diagnosis not present

## 2018-05-16 DIAGNOSIS — K8020 Calculus of gallbladder without cholecystitis without obstruction: Secondary | ICD-10-CM | POA: Diagnosis not present

## 2018-06-04 ENCOUNTER — Encounter: Payer: Federal, State, Local not specified - PPO | Attending: Family Medicine | Admitting: Nutrition

## 2018-06-04 DIAGNOSIS — K8000 Calculus of gallbladder with acute cholecystitis without obstruction: Secondary | ICD-10-CM | POA: Insufficient documentation

## 2018-06-04 DIAGNOSIS — Z9884 Bariatric surgery status: Secondary | ICD-10-CM | POA: Insufficient documentation

## 2018-06-04 NOTE — Progress Notes (Signed)
  Medical Nutrition Therapy:  Appt start time: 1400 end time:  1500.   Assessment:  Primary concerns today: Weight loss and kidney stone and gallstone information. She lives with her husband. She cooks and shops. Eats 3 meals per day and 1-2 snacks.  Gastric bypass. Lost 80-85 lbs . Had it done at [facility]. Had kidney stones and recently a gallstone.  Desires to lose 10-12 lbs.  Allergic to dairy products-tolerates cheese on salad or pizza.  Causes gas, pain and bloating otherwise.  Not exercising much.  Preferred Learning Style:   No preference indicated   Learning Readiness:  Ready  Change in progress   MEDICATIONS:   DIETARY INTAKE:  24-hr recall:  B ( AM): coffee-creamer sf dairy, banana,  Snk ( AM):   L ( PM): 1/2 chicken salad sandwich- white bread, water  Snk ( PM): apple with pb D ( PM): Grilled chicken wrap, taco size and water Snk ( PM): water Beverages: water occasionally unsweet tea  Usual physical activity: ADL  Estimated energy needs: 1200  calories 135 g carbohydrates 90 g protein 33 g fat  Progress Towards Goal(s):  In progress.   Nutritional Diagnosis:  NB-1.1 Food and nutrition-related knowledge deficit As related to high oxalate diet As evidenced by gall stones. .    Intervention:  Gallstone nutrition therapy, My Plate, portion control, benefits of exercise 60 minutes 4-5 times per week. Low dairy products, importance of water.  HIgh fiber foods Goals Increase high fiber vegetables. Drink more water Watch protein amounts Avoid dairy products Exercise 60 minutes 4 times per week  Teaching Method Utilized:  Visual Auditory Hands on  Handouts given during visit include:  The Plate Method    Meal Plan Card   Barriers to learning/adherence to lifestyle change: none  Demonstrated degree of understanding via:  Teach Back   Monitoring/Evaluation:  Dietary intake, exercise, , and body weight in 1 month(s).

## 2018-06-05 ENCOUNTER — Encounter (HOSPITAL_COMMUNITY): Payer: Self-pay | Admitting: *Deleted

## 2018-06-05 NOTE — Progress Notes (Signed)
Patient to arrive 0900 on 06/11/2018 to admitting. To bring blue folder, responsible driver, and insurance card. NPO after midnight. She cannot take aspirin or NSAIDS due to her gastric bypass surgery. She has had this done 04/26/2018 and is familiar with the instructions. She verbalizes understanding.

## 2018-06-11 ENCOUNTER — Ambulatory Visit (HOSPITAL_COMMUNITY): Payer: Federal, State, Local not specified - PPO

## 2018-06-11 ENCOUNTER — Encounter (HOSPITAL_COMMUNITY)
Admission: RE | Disposition: A | Discharge status: Home / Self Care | Payer: Self-pay | Source: Home / Self Care | Attending: Urology

## 2018-06-11 ENCOUNTER — Other Ambulatory Visit: Payer: Self-pay

## 2018-06-11 ENCOUNTER — Encounter (HOSPITAL_COMMUNITY): Payer: Self-pay | Admitting: *Deleted

## 2018-06-11 ENCOUNTER — Ambulatory Visit (HOSPITAL_COMMUNITY)
Admission: RE | Admit: 2018-06-11 | Discharge: 2018-06-11 | Disposition: A | Discharge status: Home / Self Care | Payer: Federal, State, Local not specified - PPO | Attending: Urology | Admitting: Urology

## 2018-06-11 DIAGNOSIS — Z79899 Other long term (current) drug therapy: Secondary | ICD-10-CM | POA: Diagnosis not present

## 2018-06-11 DIAGNOSIS — Z79890 Hormone replacement therapy (postmenopausal): Secondary | ICD-10-CM | POA: Diagnosis not present

## 2018-06-11 DIAGNOSIS — I493 Ventricular premature depolarization: Secondary | ICD-10-CM | POA: Diagnosis not present

## 2018-06-11 DIAGNOSIS — N200 Calculus of kidney: Secondary | ICD-10-CM | POA: Diagnosis not present

## 2018-06-11 DIAGNOSIS — Z9884 Bariatric surgery status: Secondary | ICD-10-CM | POA: Insufficient documentation

## 2018-06-11 HISTORY — PX: EXTRACORPOREAL SHOCK WAVE LITHOTRIPSY: SHX1557

## 2018-06-11 SURGERY — LITHOTRIPSY, ESWL
Anesthesia: LOCAL | Laterality: Right

## 2018-06-11 MED ORDER — ONDANSETRON 4 MG PO TBDP: 4.0000 mg | ORAL_TABLET | Freq: Three times a day (TID) | ORAL | 0 refills | Status: DC | PRN

## 2018-06-11 MED ORDER — CIPROFLOXACIN HCL 500 MG PO TABS
500.0000 mg | ORAL_TABLET | ORAL | Status: AC
  Administered 2018-06-11: 500 mg via ORAL
  Filled 2018-06-11: qty 1

## 2018-06-11 MED ORDER — HYDROCODONE-ACETAMINOPHEN 5-325 MG PO TABS: 2.0000 | ORAL_TABLET | ORAL | 0 refills | Status: DC | PRN

## 2018-06-11 MED ORDER — TAMSULOSIN HCL 0.4 MG PO CAPS: 0.4000 mg | ORAL_CAPSULE | Freq: Every day | ORAL | 0 refills | Status: DC

## 2018-06-11 MED ORDER — SODIUM CHLORIDE 0.9 % IV SOLN
INTRAVENOUS | Status: DC
  Administered 2018-06-11: 10:00:00 via INTRAVENOUS

## 2018-06-11 MED ORDER — DIPHENHYDRAMINE HCL 25 MG PO CAPS
25.0000 mg | ORAL_CAPSULE | ORAL | Status: AC
  Administered 2018-06-11: 25 mg via ORAL
  Filled 2018-06-11: qty 1

## 2018-06-11 MED ORDER — DIAZEPAM 5 MG PO TABS
10.0000 mg | ORAL_TABLET | ORAL | Status: AC
  Administered 2018-06-11: 10 mg via ORAL
  Filled 2018-06-11: qty 2

## 2018-06-11 NOTE — Discharge Instructions (Signed)
Lithotripsy, Care After °This sheet gives you information about how to care for yourself after your procedure. Your health care provider may also give you more specific instructions. If you have problems or questions, contact your health care provider. °What can I expect after the procedure? °After the procedure, it is common to have: °· Some blood in your urine. This should only last for a few days. °· Soreness in your back, sides, or upper abdomen for a few days. °· Blotches or bruises on your back where the pressure wave entered the skin. °· Pain, discomfort, or nausea when pieces (fragments) of the kidney stone move through the tube that carries urine from the kidney to the bladder (ureter). Stone fragments may pass soon after the procedure, but they may continue to pass for up to 4-8 weeks. °? If you have severe pain or nausea, contact your health care provider. This may be caused by a large stone that was not broken up, and this may mean that you need more treatment. °· Some pain or discomfort during urination. °· Some pain or discomfort in the lower abdomen or (in men) at the base of the penis. °Follow these instructions at home: °Medicines °· Take over-the-counter and prescription medicines only as told by your health care provider. °· If you were prescribed an antibiotic medicine, take it as told by your health care provider. Do not stop taking the antibiotic even if you start to feel better. °· Do not drive for 24 hours if you were given a medicine to help you relax (sedative). °· Do not drive or use heavy machinery while taking prescription pain medicine. °Eating and drinking ° °  ° °· Drink enough water and fluids to keep your urine clear or pale yellow. This helps any remaining pieces of the stone to pass. It can also help prevent new stones from forming. °· Eat plenty of fresh fruits and vegetables. °· Follow instructions from your health care provider about eating and drinking restrictions. You may be  instructed: °? To reduce how much salt (sodium) you eat or drink. Check ingredients and nutrition facts on packaged foods and beverages. °? To reduce how much meat you eat. °· Eat the recommended amount of calcium for your age and gender. Ask your health care provider how much calcium you should have. °General instructions °· Get plenty of rest. °· Most people can resume normal activities 1-2 days after the procedure. Ask your health care provider what activities are safe for you. °· Your health care provider may direct you to lie in a certain position (postural drainage) and tap firmly (percuss) over your kidney area to help stone fragments pass. Follow instructions as told by your health care provider. °· If directed, strain all urine through the strainer that was provided by your health care provider. °? Keep all fragments for your health care provider to see. Any stones that are found may be sent to a medical lab for examination. The stone may be as small as a grain of salt. °· Keep all follow-up visits as told by your health care provider. This is important. °Contact a health care provider if: °· You have pain that is severe or does not get better with medicine. °· You have nausea that is severe or does not go away. °· You have blood in your urine longer than your health care provider told you to expect. °· You have more blood in your urine. °· You have pain during urination that does   not go away. °· You urinate more frequently than usual and this does not go away. °· You develop a rash or any other possible signs of an allergic reaction. °Get help right away if: °· You have severe pain in your back, sides, or upper abdomen. °· You have severe pain while urinating. °· Your urine is very dark red. °· You have blood in your stool (feces). °· You cannot pass any urine at all. °· You feel a strong urge to urinate after emptying your bladder. °· You have a fever or chills. °· You develop shortness of breath,  difficulty breathing, or chest pain. °· You have severe nausea that leads to persistent vomiting. °· You faint. °Summary °· After this procedure, it is common to have some pain, discomfort, or nausea when pieces (fragments) of the kidney stone move through the tube that carries urine from the kidney to the bladder (ureter). If this pain or nausea is severe, however, you should contact your health care provider. °· Most people can resume normal activities 1-2 days after the procedure. Ask your health care provider what activities are safe for you. °· Drink enough water and fluids to keep your urine clear or pale yellow. This helps any remaining pieces of the stone to pass, and it can help prevent new stones from forming. °· If directed, strain your urine and keep all fragments for your health care provider to see. Fragments or stones may be as small as a grain of salt. °· Get help right away if you have severe pain in your back, sides, or upper abdomen or have severe pain while urinating. °This information is not intended to replace advice given to you by your health care provider. Make sure you discuss any questions you have with your health care provider. °Document Released: 04/10/2007 Document Revised: 08/30/2017 Document Reviewed: 02/10/2016 °Elsevier Interactive Patient Education © 2019 [employer] ° ° °General Anesthesia, Adult, Care After °This sheet gives you information about how to care for yourself after your procedure. Your health care provider may also give you more specific instructions. If you have problems or questions, contact your health care provider. °What can I expect after the procedure? °After the procedure, the following side effects are common: °· Pain or discomfort at the IV site. °· Nausea. °· Vomiting. °· Sore throat. °· Trouble concentrating. °· Feeling cold or chills. °· Weak or tired. °· Sleepiness and fatigue. °· Soreness and body aches. These side effects can affect parts of the  body that were not involved in surgery. °Follow these instructions at home: ° °For at least 24 hours after the procedure: °· Have a responsible adult stay with you. It is important to have someone help care for you until you are awake and alert. °· Rest as needed. °· Do not: °? Participate in activities in which you could fall or become injured. °? Drive. °? Use heavy machinery. °? Drink alcohol. °? Take sleeping pills or medicines that cause drowsiness. °? Make important decisions or sign legal documents. °? Take care of children on your own. °Eating and drinking °· Follow any instructions from your health care provider about eating or drinking restrictions. °· When you feel hungry, start by eating small amounts of foods that are soft and easy to digest (bland), such as toast. Gradually return to your regular diet. °· Drink enough fluid to keep your urine pale yellow. °· If you vomit, rehydrate by drinking water, juice, or clear broth. °General instructions °· If   you have sleep apnea, surgery and certain medicines can increase your risk for breathing problems. Follow instructions from your health care provider about wearing your sleep device: °? Anytime you are sleeping, including during daytime naps. °? While taking prescription pain medicines, sleeping medicines, or medicines that make you drowsy. °· Return to your normal activities as told by your health care provider. Ask your health care provider what activities are safe for you. °· Take over-the-counter and prescription medicines only as told by your health care provider. °· If you smoke, do not smoke without supervision. °· Keep all follow-up visits as told by your health care provider. This is important. °Contact a health care provider if: °· You have nausea or vomiting that does not get better with medicine. °· You cannot eat or drink without vomiting. °· You have pain that does not get better with medicine. °· You are unable to pass urine. °· You develop  a skin rash. °· You have a fever. °· You have redness around your IV site that gets worse. °Get help right away if: °· You have difficulty breathing. °· You have chest pain. °· You have blood in your urine or stool, or you vomit blood. °Summary °· After the procedure, it is common to have a sore throat or nausea. It is also common to feel tired. °· Have a responsible adult stay with you for the first 24 hours after general anesthesia. It is important to have someone help care for you until you are awake and alert. °· When you feel hungry, start by eating small amounts of foods that are soft and easy to digest (bland), such as toast. Gradually return to your regular diet. °· Drink enough fluid to keep your urine pale yellow. °· Return to your normal activities as told by your health care provider. Ask your health care provider what activities are safe for you. °This information is not intended to replace advice given to you by your health care provider. Make sure you discuss any questions you have with your health care provider. °Document Released: 06/27/2000 Document Revised: 11/04/2016 Document Reviewed: 11/04/2016 °Elsevier Interactive Patient Education © 2019 [employer] ° °

## 2018-06-12 ENCOUNTER — Encounter (HOSPITAL_COMMUNITY): Payer: Self-pay | Admitting: Urology

## 2018-06-13 DIAGNOSIS — N200 Calculus of kidney: Secondary | ICD-10-CM | POA: Diagnosis not present

## 2018-06-13 DIAGNOSIS — R8271 Bacteriuria: Secondary | ICD-10-CM | POA: Diagnosis not present

## 2018-06-14 ENCOUNTER — Encounter: Payer: Self-pay | Admitting: Nutrition

## 2018-06-14 NOTE — Patient Instructions (Addendum)
Goals Increase high fiber vegetables. Drink more water Watch protein amounts Avoid dairy products Exercise 60 minutes 4 times per week

## 2018-06-25 DIAGNOSIS — N200 Calculus of kidney: Secondary | ICD-10-CM | POA: Diagnosis not present

## 2018-06-25 DIAGNOSIS — N202 Calculus of kidney with calculus of ureter: Secondary | ICD-10-CM | POA: Diagnosis not present

## 2018-07-18 ENCOUNTER — Ambulatory Visit: Payer: Federal, State, Local not specified - PPO | Admitting: Nutrition

## 2018-08-29 ENCOUNTER — Other Ambulatory Visit: Payer: Self-pay

## 2018-08-29 NOTE — Telephone Encounter (Signed)
Medication refill request: promethazine 12.5mg  Last AEX:  10-09-17 Next AEX: 01-22-2019 Last MMG (if hormonal medication request): n/a Refill authorized: please approve if appropriate

## 2018-08-31 MED ORDER — PROMETHAZINE HCL 12.5 MG PO TABS: 12.5000 mg | ORAL_TABLET | Freq: Three times a day (TID) | ORAL | 1 refills | Status: DC | PRN

## 2018-09-04 DIAGNOSIS — N200 Calculus of kidney: Secondary | ICD-10-CM | POA: Diagnosis not present

## 2018-09-24 DIAGNOSIS — K829 Disease of gallbladder, unspecified: Secondary | ICD-10-CM | POA: Diagnosis not present

## 2018-10-15 ENCOUNTER — Other Ambulatory Visit: Payer: Self-pay

## 2018-10-15 DIAGNOSIS — Z20822 Contact with and (suspected) exposure to covid-19: Secondary | ICD-10-CM

## 2018-10-15 DIAGNOSIS — R6889 Other general symptoms and signs: Secondary | ICD-10-CM | POA: Diagnosis not present

## 2018-10-19 LAB — NOVEL CORONAVIRUS, NAA: SARS-CoV-2, NAA: NOT DETECTED

## 2018-10-31 ENCOUNTER — Encounter: Payer: Self-pay | Admitting: Obstetrics & Gynecology

## 2018-10-31 DIAGNOSIS — Z1231 Encounter for screening mammogram for malignant neoplasm of breast: Secondary | ICD-10-CM | POA: Diagnosis not present

## 2018-10-31 DIAGNOSIS — Z803 Family history of malignant neoplasm of breast: Secondary | ICD-10-CM | POA: Diagnosis not present

## 2018-11-23 DIAGNOSIS — Z9884 Bariatric surgery status: Secondary | ICD-10-CM | POA: Diagnosis not present

## 2018-11-23 DIAGNOSIS — K9589 Other complications of other bariatric procedure: Secondary | ICD-10-CM | POA: Diagnosis not present

## 2018-11-23 DIAGNOSIS — E559 Vitamin D deficiency, unspecified: Secondary | ICD-10-CM | POA: Diagnosis not present

## 2018-11-23 DIAGNOSIS — E039 Hypothyroidism, unspecified: Secondary | ICD-10-CM | POA: Diagnosis not present

## 2018-11-28 DIAGNOSIS — K9589 Other complications of other bariatric procedure: Secondary | ICD-10-CM | POA: Diagnosis not present

## 2018-11-28 DIAGNOSIS — Z9884 Bariatric surgery status: Secondary | ICD-10-CM | POA: Diagnosis not present

## 2018-11-28 DIAGNOSIS — R635 Abnormal weight gain: Secondary | ICD-10-CM | POA: Diagnosis not present

## 2018-11-28 DIAGNOSIS — E039 Hypothyroidism, unspecified: Secondary | ICD-10-CM | POA: Diagnosis not present

## 2018-11-28 DIAGNOSIS — E559 Vitamin D deficiency, unspecified: Secondary | ICD-10-CM | POA: Diagnosis not present

## 2018-12-05 DIAGNOSIS — Z9884 Bariatric surgery status: Secondary | ICD-10-CM | POA: Diagnosis not present

## 2019-01-18 ENCOUNTER — Other Ambulatory Visit: Payer: Self-pay

## 2019-01-22 ENCOUNTER — Encounter

## 2019-01-22 ENCOUNTER — Ambulatory Visit: Payer: Federal, State, Local not specified - PPO | Admitting: Obstetrics & Gynecology

## 2019-03-07 ENCOUNTER — Other Ambulatory Visit: Payer: Self-pay

## 2019-03-12 ENCOUNTER — Other Ambulatory Visit: Payer: Self-pay

## 2019-03-12 ENCOUNTER — Encounter: Payer: Self-pay | Admitting: Obstetrics & Gynecology

## 2019-03-12 ENCOUNTER — Ambulatory Visit (INDEPENDENT_AMBULATORY_CARE_PROVIDER_SITE_OTHER): Payer: Federal, State, Local not specified - PPO | Admitting: Obstetrics & Gynecology

## 2019-03-12 VITALS — BP 132/70 | HR 76 | Temp 97.3°F | Resp 12 | Ht 62.5 in | Wt 174.6 lb

## 2019-03-12 DIAGNOSIS — Z01419 Encounter for gynecological examination (general) (routine) without abnormal findings: Secondary | ICD-10-CM

## 2019-03-12 NOTE — Progress Notes (Signed)
58 y.o. [id number] Married White or Caucasian female here for annual exam.  Doing well.  Denies vaginal bleeding.  She had issues with kidney stones and had lithotripsy x 2.    Patient states that she feels an occasional discomfort in left breast.  Patient's last menstrual period was 04/04/2005.          Sexually active: Yes.    The current method of family planning is tubal ligation.    Exercising: Yes.    walking, dancing, and yoga Smoker:  no  Health Maintenance: Pap: hysterectomy History of abnormal Pap:  no MMG:  10/31/18 BIRADS 1 negative/density b Colonoscopy:  04/20/11 incomplete: f/u 10 years BMD:   n/a  TDaP:  03/16/11 Pneumonia vaccine(s):  Prevnar 01/03/13 Shingrix:   No Hep C testing: none Screening Labs: PCP   reports that she has never smoked. She has never used smokeless tobacco. She reports that she does not drink alcohol or use drugs.  Past Medical History:  Diagnosis Date  . Allergy   . Breast mass   . Depression   . Diabetes mellitus without complication (HCC)    history of, had gastric bypass, no longer needs medications  . Gallstone   . History of kidney stones   . Hypertension    history of before gastric- by-pass  . Hypothyroidism   . Sleep apnea    wears CPAP- no longer needs since bypass  . Thyroid disease    hypothyroid    Past Surgical History:  Procedure Laterality Date  . bypass leak repair  11/21/12  . EXTRACORPOREAL SHOCK WAVE LITHOTRIPSY Right 04/26/2018   Procedure: EXTRACORPOREAL SHOCK WAVE LITHOTRIPSY (ESWL);  Surgeon: Cleon Gustin, MD;  Location: [facility];  Service: Urology;  Laterality: Right;  . EXTRACORPOREAL SHOCK WAVE LITHOTRIPSY Right 06/11/2018   Procedure: EXTRACORPOREAL SHOCK WAVE LITHOTRIPSY (ESWL);  Surgeon: Cleon Gustin, MD;  Location: [facility];  Service: Urology;  Laterality: Right;  . GASTRIC BYPASS  11/14/12  . HERNIA REPAIR  5/15  . VAGINAL HYSTERECTOMY  8/07    Current Outpatient Medications  Medication Sig  Dispense Refill  . azelastine (ASTELIN) 0.1 % nasal spray Place 1 spray into both nostrils 2 (two) times daily.    Marland Kitchen CALCIUM PO Take 1 tablet by mouth 2 (two) times daily.    . cholecalciferol (VITAMIN D3) 25 MCG (1000 UT) tablet Take 1,000 Units by mouth daily.    . Cyanocobalamin (B-12 SL) Place 1 tablet under the tongue daily.    Marland Kitchen escitalopram (LEXAPRO) 20 MG tablet Take 20 mg by mouth at bedtime.     Marland Kitchen levothyroxine (SYNTHROID, LEVOTHROID) 50 MCG tablet Take 1 tablet by mouth every evening.     . ondansetron (ZOFRAN ODT) 4 MG disintegrating tablet Take 1 tablet (4 mg total) by mouth every 8 (eight) hours as needed for nausea. 30 tablet 0  . promethazine (PHENERGAN) 12.5 MG tablet Take 1 tablet (12.5 mg total) by mouth every 8 (eight) hours as needed for nausea or vomiting. 30 tablet 1   No current facility-administered medications for this visit.     Family History  Problem Relation Age of Onset  . Hypertension Mother   . Breast cancer Mother   . Cancer Father   . Heart disease Father        bypass  . Diabetes Father   . Breast cancer Maternal Grandmother           . Diabetes Paternal Grandfather   . Congestive  Heart Failure Maternal Aunt   . Colon cancer Neg Hx   . Esophageal cancer Neg Hx   . Rectal cancer Neg Hx   . Stomach cancer Neg Hx     Review of Systems  Constitutional:       Discomfort in left breast  All other systems reviewed and are negative.   Exam:   BP 132/70 (BP Location: Right Arm, Patient Position: Sitting, Cuff Size: Large)   Pulse 76   Temp (!) 97.3 F (36.3 C) (Temporal)   Resp 12   Ht 5' 2.5" (1.588 m)   Wt 174 lb 9.6 oz (79.2 kg)   LMP 04/04/2005   BMI 31.43 kg/m   Height: 5' 2.5" (158.8 cm)  Ht Readings from Last 3 Encounters:  03/12/19 5' 2.5" (1.588 m)  06/11/18 5\' 3"  (1.6 m)  04/26/18 5\' 3"  (1.6 m)    General appearance: alert, cooperative and appears stated age Head: Normocephalic, without obvious abnormality, atraumatic Neck:  no adenopathy, supple, symmetrical, trachea midline and thyroid normal to inspection and palpation Lungs: clear to auscultation bilaterally Breasts: normal appearance, no masses or tenderness Heart: regular rate and rhythm Abdomen: soft, non-tender; bowel sounds normal; no masses,  no organomegaly Extremities: extremities normal, atraumatic, no cyanosis or edema Skin: Skin color, texture, turgor normal. No rashes or lesions Lymph nodes: Cervical, supraclavicular, and axillary nodes normal. No abnormal inguinal nodes palpated Neurologic: Grossly normal   Pelvic: External genitalia:  no lesions              Urethra:  normal appearing urethra with no masses, tenderness or lesions              Bartholins and Skenes: normal                 Vagina: normal appearing vagina with normal color and discharge, no lesions              Cervix: absent              Pap taken: No. Bimanual Exam:  Uterus:  uterus absent              Adnexa: no mass, fullness, tenderness               Rectovaginal: Confirms               Anus:  normal sphincter tone, no lesions  Chaperone was present for exam.  A:  Well Woman with normal exam PMP, no HRT H/o TVH 8/07 Gastric bypass done 2014 H/o incarcerated hernia with partial small bowel obstruction 5/15 Family hx of breast cancer in mother and MGM.  Declines genetic testing.  Her mother has not had genetic testing.   Tyrer Cusick model for lifetime risk is 22.5%.  Breast MRI discussed.    P:   Mammogram guidelines reviewed.  She is going to start breast MRI in January pap smear not indicated Blood work done with Dr. 6/15 in the summer Vaccines are UTD BMD will be scheduled with MMG scheduled in July Return annually or prn

## 2019-03-13 ENCOUNTER — Telehealth: Payer: Self-pay

## 2019-03-13 NOTE — Telephone Encounter (Signed)
Tried calling patient regarding appointment date and time for MMG and Bone Density. No answer, message left for patient on voicemail. Okay per DPR to leave a detailed message on patient's mobile voicemail. Patient is scheduled for 11/06/19, 11:30 for bone density and 12 noon for MMG.

## 2019-04-09 ENCOUNTER — Telehealth: Payer: Self-pay | Admitting: Obstetrics & Gynecology

## 2019-04-09 DIAGNOSIS — Z803 Family history of malignant neoplasm of breast: Secondary | ICD-10-CM

## 2019-04-09 DIAGNOSIS — Z1239 Encounter for other screening for malignant neoplasm of breast: Secondary | ICD-10-CM

## 2019-04-09 DIAGNOSIS — Z9189 Other specified personal risk factors, not elsewhere classified: Secondary | ICD-10-CM

## 2019-04-09 NOTE — Telephone Encounter (Signed)
Patient is asking for an update for her breast MRI appointment?

## 2019-04-09 NOTE — Telephone Encounter (Signed)
Reviewed 03/12/19 AEX notes.  Family Hx breast cancer mom at 25 and MGM at 13.  Tyrer Cusick 22.5%  Order placed for MRI breast bilateral w/wo contrast at [facility]   Call placed to patient. Advised order for MRI placed to [facility], they will contact you directly to schedule. Advised patient once MRI is scheduled our office will precert. Questions answered. Patient verbalizes understanding and is agreeable.   Routing to provider for final review. Patient is agreeable to disposition. Will close encounter.  Cc: Harland Dingwall

## 2019-04-24 ENCOUNTER — Other Ambulatory Visit: Payer: Self-pay

## 2019-04-24 ENCOUNTER — Ambulatory Visit: Payer: Federal, State, Local not specified - PPO | Attending: Internal Medicine

## 2019-04-24 DIAGNOSIS — Z20822 Contact with and (suspected) exposure to covid-19: Secondary | ICD-10-CM

## 2019-04-25 ENCOUNTER — Other Ambulatory Visit: Payer: Self-pay | Admitting: *Deleted

## 2019-04-25 LAB — NOVEL CORONAVIRUS, NAA: SARS-CoV-2, NAA: NOT DETECTED

## 2019-04-25 NOTE — Progress Notes (Signed)
Opened in error

## 2019-05-01 ENCOUNTER — Ambulatory Visit
Admission: RE | Admit: 2019-05-01 | Discharge: 2019-05-01 | Disposition: A | Discharge status: Home / Self Care | Payer: Federal, State, Local not specified - PPO | Source: Ambulatory Visit | Attending: Obstetrics & Gynecology | Admitting: Obstetrics & Gynecology

## 2019-05-01 ENCOUNTER — Other Ambulatory Visit: Payer: Self-pay

## 2019-05-01 DIAGNOSIS — N6001 Solitary cyst of right breast: Secondary | ICD-10-CM | POA: Diagnosis not present

## 2019-05-01 DIAGNOSIS — Z1239 Encounter for other screening for malignant neoplasm of breast: Secondary | ICD-10-CM

## 2019-05-01 DIAGNOSIS — Z9189 Other specified personal risk factors, not elsewhere classified: Secondary | ICD-10-CM

## 2019-05-01 DIAGNOSIS — Z803 Family history of malignant neoplasm of breast: Secondary | ICD-10-CM

## 2019-05-01 MED ORDER — GADOBUTROL 1 MMOL/ML IV SOLN
7.0000 mL | Freq: Once | INTRAVENOUS | Status: AC | PRN
  Administered 2019-05-01: 14:00:00 7 mL via INTRAVENOUS

## 2019-05-09 DIAGNOSIS — N200 Calculus of kidney: Secondary | ICD-10-CM | POA: Diagnosis not present

## 2019-06-11 IMAGING — CT CT MAXILLOFACIAL W/O CM
3 series · 14 of 47 positions shown, 16 images · non-contrast
Comparison: MRI of the head May 06, 2006.

CLINICAL DATA: Persistent intermittent sinus headaches since
December 2016, completed 2 courses of antibiotics.

EXAM:
CT MAXILLOFACIAL WITHOUT CONTRAST
TECHNIQUE: Multidetector CT images of the paranasal sinuses were obtained using
the standard protocol without intravenous contrast.

[Series 2: standard · axial · 0.30mm/px · z∈[+54,+132]mm · 8 of 92 slices shown, 10 images]
[im 7/92  brain]
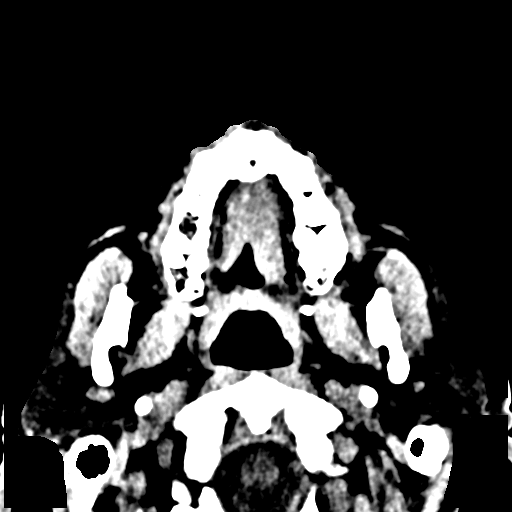
[im 7/92  bone]
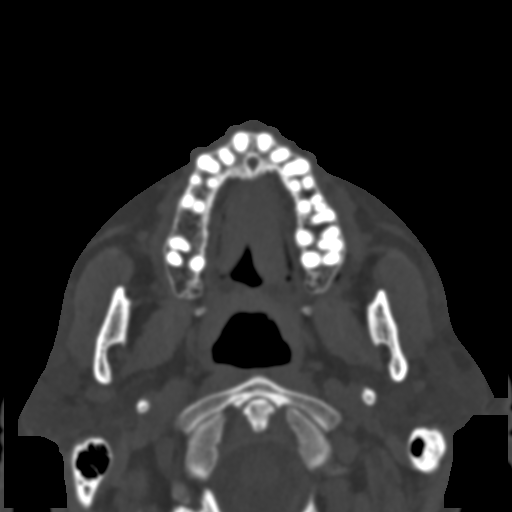
[im 19/92  bone]
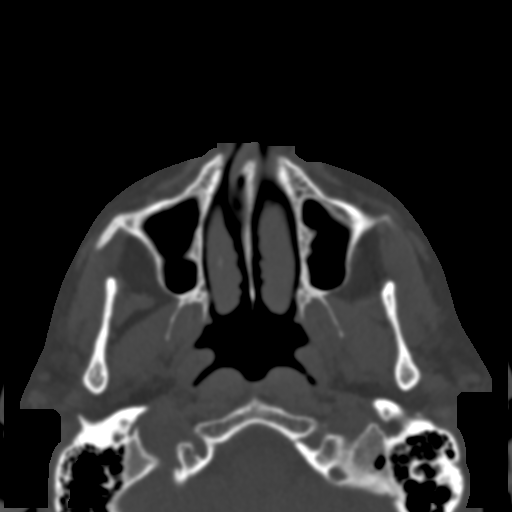
[im 29/92  bone]
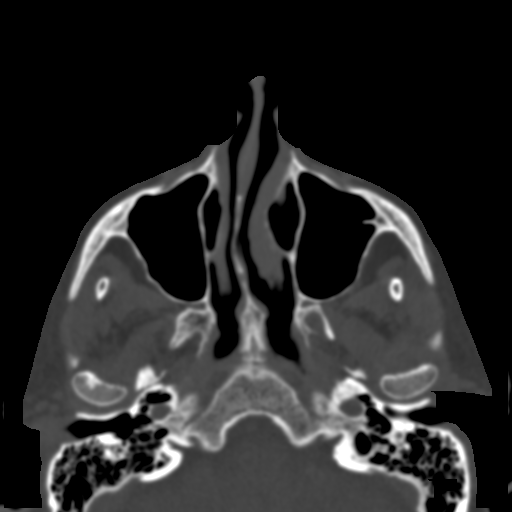
[im 41/92  bone]
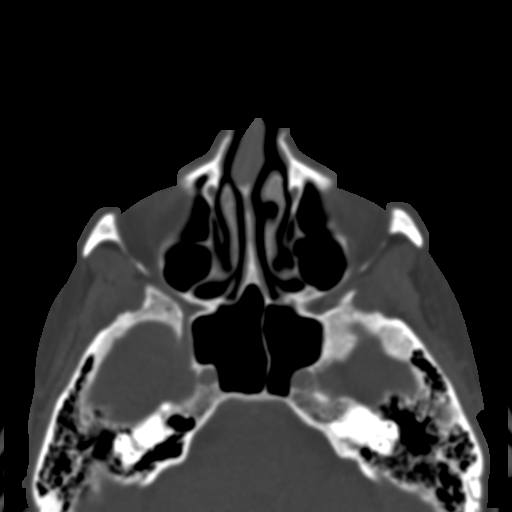
[im 51/92  brain]
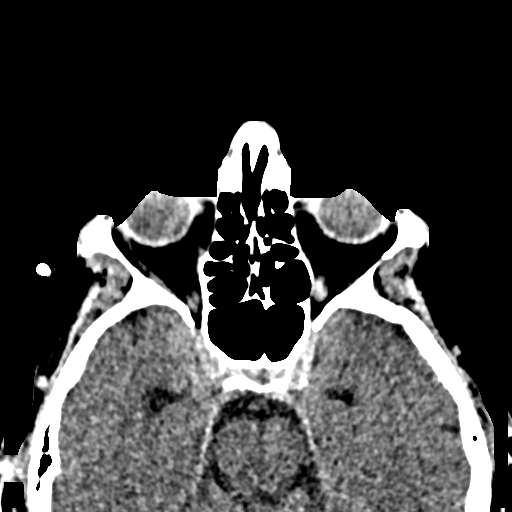
[im 51/92  bone]
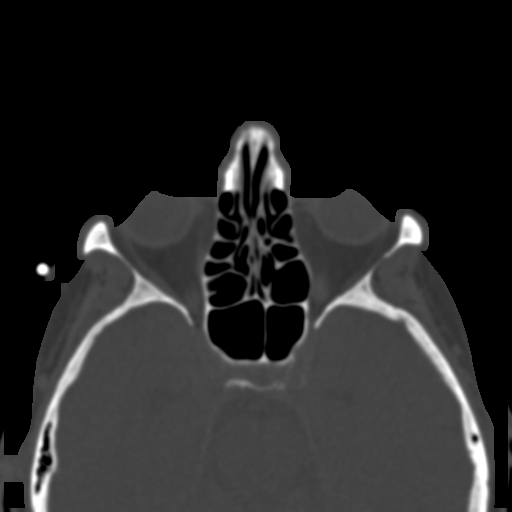
[im 63/92  bone]
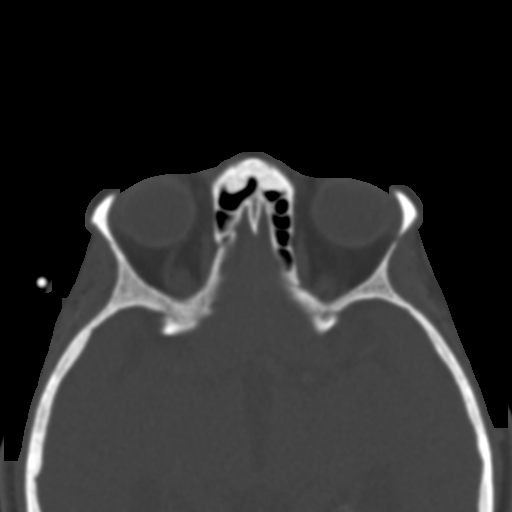
[im 73/92  bone]
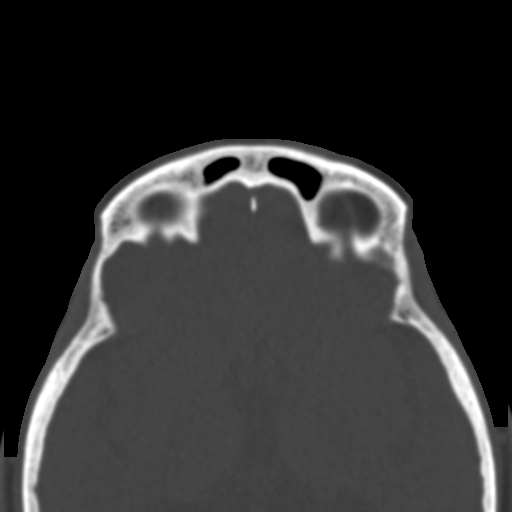
[im 85/92  bone]
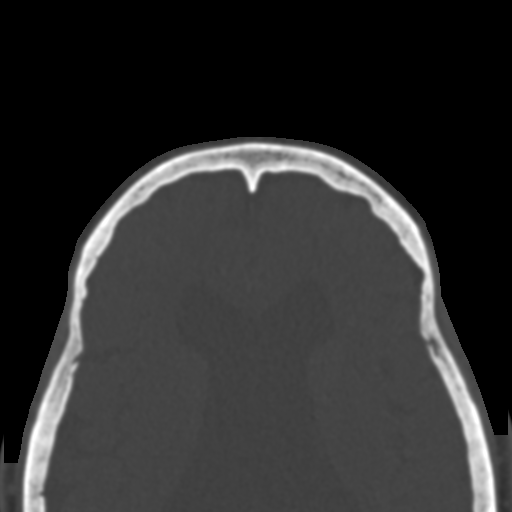

[Series 4: coronal · coronal · 0.20mm/px · 3 of 71 slices shown]
[im 24/71  bone]
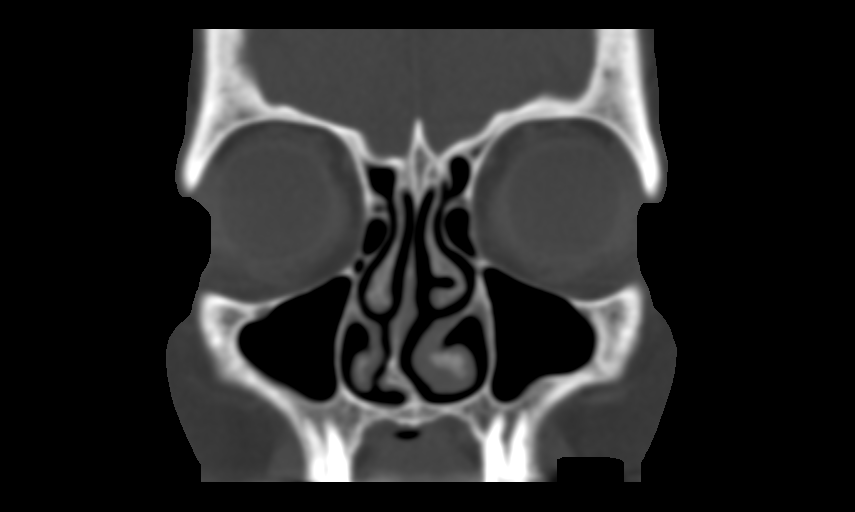
[im 32/71  bone]
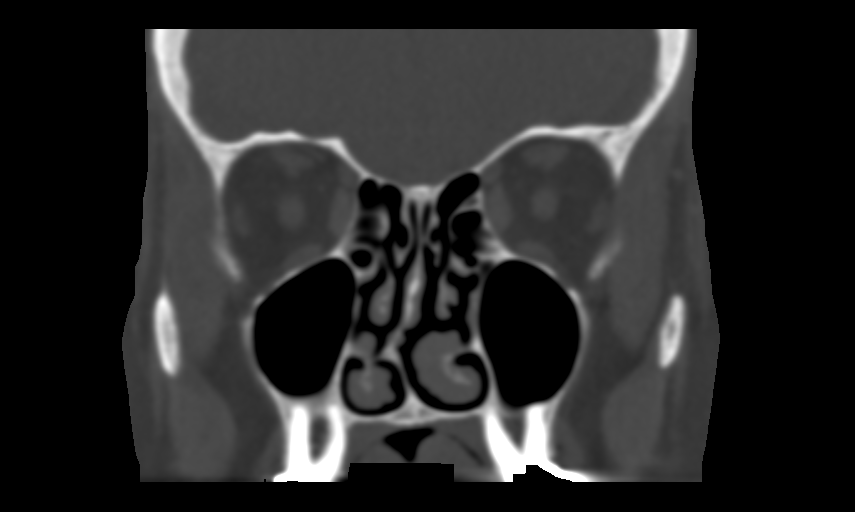
[im 39/71  bone]
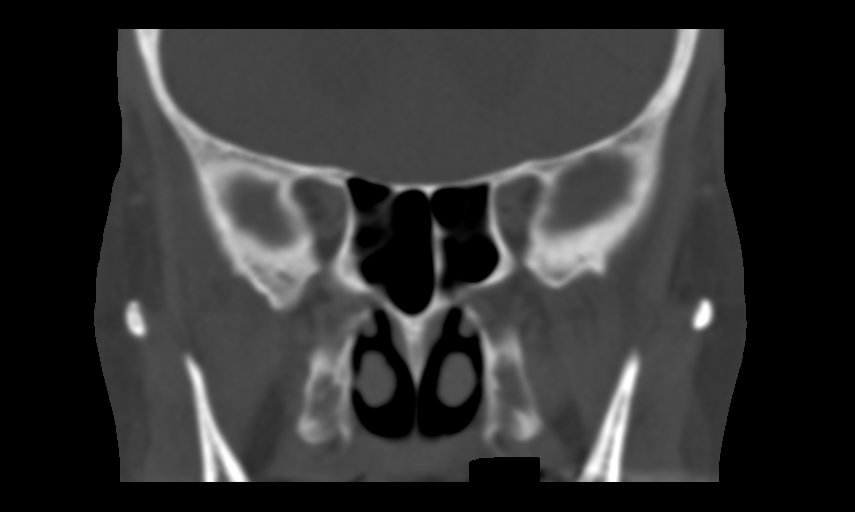

[Series 5: sagittal · sagittal · 0.22mm/px · 3 of 81 slices shown]
[im 27/81  bone]
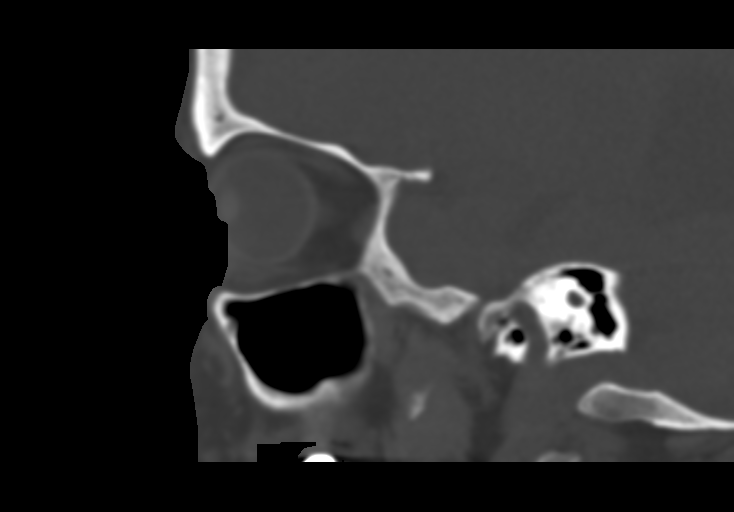
[im 41/81  bone]
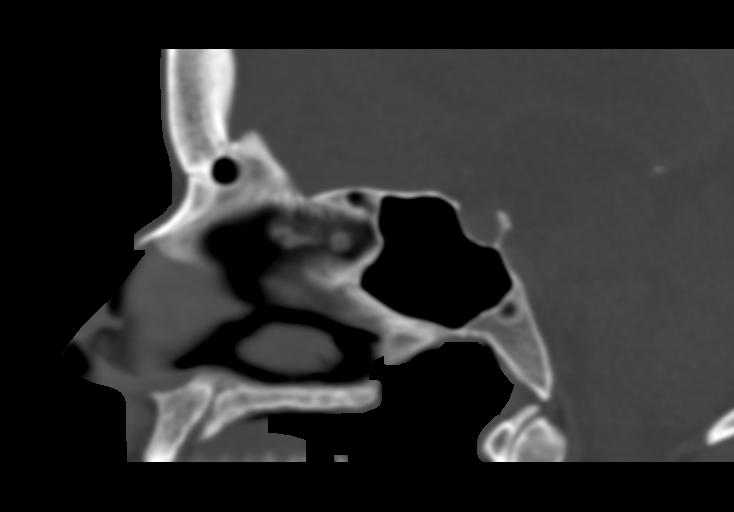
[im 54/81  bone]
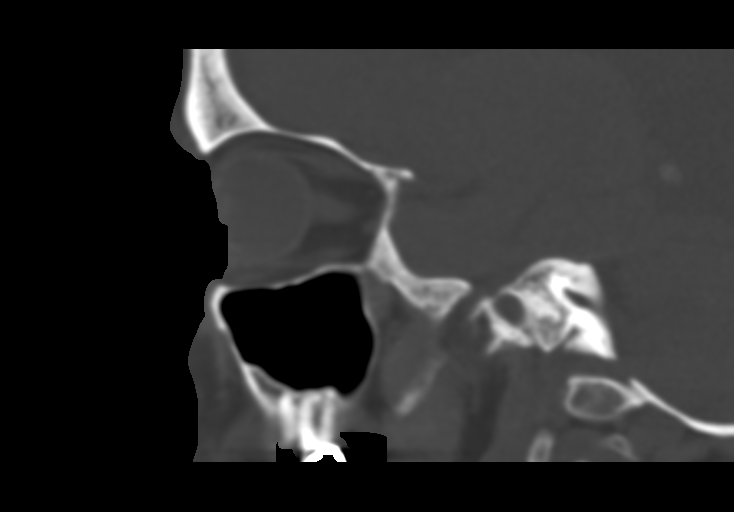

[14 of 47 positions shown; findings below may reference images not displayed]

FINDINGS: Paranasal sinuses:

Frontal: Normally aerated. Patent frontal sinus drainage pathways.

Ethmoid: Normally aerated.

Maxillary: Trace mucosal thickening.

Sphenoid: Normally aerated. Patent sphenoethmoidal recesses.
Pneumatized lateral recesses.

Right ostiomeatal unit: Narrowed.

Left ostiomeatal unit: Patent.

Nasal passages: Patent. LEFT concha lamella. Intact nasal septum is
deviated to the RIGHT with small bony spur.

Anatomy: Pneumatized RIGHT anterior clinoid. RIGHT fovea ethmoidalis
is mildly shallower than the LEFT. Sellar sphenoid pneumatization
pattern. Carotid canal protrudes into posterior sphenoid sinus with
out dehiscence.

Other: None.
IMPRESSION: Trace maxillary sinus mucosal thickening. Narrowed RIGHT ostiomeatal
unit.

## 2019-06-13 DIAGNOSIS — Z713 Dietary counseling and surveillance: Secondary | ICD-10-CM | POA: Diagnosis not present

## 2019-06-17 ENCOUNTER — Other Ambulatory Visit: Payer: Self-pay | Admitting: Obstetrics & Gynecology

## 2019-06-17 NOTE — Telephone Encounter (Signed)
Medication refill request: Phenergan 12.5 mg every 8 hours as needed for nausea or vomiting Last AEX:  03/12/2019 Next AEX: 05/29/2020 Last MMG (if hormonal medication request): N/A Refill authorized: Phenergan 12.5 mg #30 1RF pending review

## 2019-06-19 ENCOUNTER — Other Ambulatory Visit: Payer: Self-pay | Admitting: Obstetrics & Gynecology

## 2019-06-19 NOTE — Telephone Encounter (Signed)
Patient is calling to follow up on refill request °

## 2019-06-21 NOTE — Telephone Encounter (Signed)
Left message letting patient know her rx was sent to the pharmacy 

## 2019-07-01 DIAGNOSIS — Z23 Encounter for immunization: Secondary | ICD-10-CM | POA: Diagnosis not present

## 2019-07-09 DIAGNOSIS — Z713 Dietary counseling and surveillance: Secondary | ICD-10-CM | POA: Diagnosis not present

## 2019-07-29 DIAGNOSIS — Z23 Encounter for immunization: Secondary | ICD-10-CM | POA: Diagnosis not present

## 2019-09-23 DIAGNOSIS — M79675 Pain in left toe(s): Secondary | ICD-10-CM | POA: Diagnosis not present

## 2019-10-16 DIAGNOSIS — E063 Autoimmune thyroiditis: Secondary | ICD-10-CM | POA: Diagnosis not present

## 2019-10-16 DIAGNOSIS — E6609 Other obesity due to excess calories: Secondary | ICD-10-CM | POA: Diagnosis not present

## 2019-10-16 DIAGNOSIS — F320 Major depressive disorder, single episode, mild: Secondary | ICD-10-CM | POA: Diagnosis not present

## 2019-10-16 DIAGNOSIS — Z6830 Body mass index (BMI) 30.0-30.9, adult: Secondary | ICD-10-CM | POA: Diagnosis not present

## 2019-10-24 ENCOUNTER — Other Ambulatory Visit: Payer: Self-pay | Admitting: Obstetrics & Gynecology

## 2019-10-24 NOTE — Telephone Encounter (Signed)
Medication refill request: Promethazine Last AEX:  03/12/19 SM Next AEX: 05/29/20 Last MMG (if hormonal medication request): n/a Refill authorized: Today, please advise

## 2019-11-05 DIAGNOSIS — Z713 Dietary counseling and surveillance: Secondary | ICD-10-CM | POA: Diagnosis not present

## 2019-11-06 DIAGNOSIS — M85852 Other specified disorders of bone density and structure, left thigh: Secondary | ICD-10-CM | POA: Diagnosis not present

## 2019-11-06 DIAGNOSIS — Z780 Asymptomatic menopausal state: Secondary | ICD-10-CM | POA: Diagnosis not present

## 2019-11-06 DIAGNOSIS — Z1231 Encounter for screening mammogram for malignant neoplasm of breast: Secondary | ICD-10-CM | POA: Diagnosis not present

## 2019-11-06 DIAGNOSIS — M85851 Other specified disorders of bone density and structure, right thigh: Secondary | ICD-10-CM | POA: Diagnosis not present

## 2019-11-08 ENCOUNTER — Telehealth: Payer: Self-pay

## 2019-11-08 NOTE — Telephone Encounter (Addendum)
Left detailed message regarding bone density results showing osteopenia only & to repeat in 3-53yrs. See scanned in result.

## 2019-11-12 ENCOUNTER — Encounter: Payer: Self-pay | Admitting: Obstetrics & Gynecology

## 2019-12-11 DIAGNOSIS — J329 Chronic sinusitis, unspecified: Secondary | ICD-10-CM | POA: Diagnosis not present

## 2019-12-13 DIAGNOSIS — Z1159 Encounter for screening for other viral diseases: Secondary | ICD-10-CM | POA: Diagnosis not present

## 2020-01-30 DIAGNOSIS — Z9884 Bariatric surgery status: Secondary | ICD-10-CM | POA: Diagnosis not present

## 2020-02-04 DIAGNOSIS — Z9884 Bariatric surgery status: Secondary | ICD-10-CM | POA: Diagnosis not present

## 2020-02-15 ENCOUNTER — Other Ambulatory Visit: Payer: Self-pay | Admitting: Obstetrics & Gynecology

## 2020-03-18 ENCOUNTER — Ambulatory Visit: Payer: Federal, State, Local not specified - PPO | Admitting: Urology

## 2020-03-19 DIAGNOSIS — Z23 Encounter for immunization: Secondary | ICD-10-CM | POA: Diagnosis not present

## 2020-03-20 ENCOUNTER — Ambulatory Visit: Payer: Federal, State, Local not specified - PPO | Admitting: Urology

## 2020-04-09 ENCOUNTER — Emergency Department (HOSPITAL_COMMUNITY): Payer: Federal, State, Local not specified - PPO

## 2020-04-09 ENCOUNTER — Other Ambulatory Visit: Payer: Self-pay

## 2020-04-09 ENCOUNTER — Encounter (HOSPITAL_COMMUNITY)
Admission: EM | Disposition: A | Discharge status: Home / Self Care | Payer: Self-pay | Source: Home / Self Care | Attending: Emergency Medicine

## 2020-04-09 ENCOUNTER — Ambulatory Visit (HOSPITAL_COMMUNITY)
Admission: EM | Admit: 2020-04-09 | Discharge: 2020-04-09 | Disposition: A | Discharge status: Home / Self Care | Payer: Federal, State, Local not specified - PPO | Attending: Emergency Medicine | Admitting: Emergency Medicine

## 2020-04-09 ENCOUNTER — Emergency Department (HOSPITAL_COMMUNITY): Payer: Federal, State, Local not specified - PPO | Admitting: Certified Registered"

## 2020-04-09 ENCOUNTER — Encounter (HOSPITAL_COMMUNITY): Payer: Self-pay | Admitting: Emergency Medicine

## 2020-04-09 DIAGNOSIS — F32A Depression, unspecified: Secondary | ICD-10-CM | POA: Insufficient documentation

## 2020-04-09 DIAGNOSIS — Z79899 Other long term (current) drug therapy: Secondary | ICD-10-CM | POA: Insufficient documentation

## 2020-04-09 DIAGNOSIS — E039 Hypothyroidism, unspecified: Secondary | ICD-10-CM | POA: Diagnosis not present

## 2020-04-09 DIAGNOSIS — Z882 Allergy status to sulfonamides status: Secondary | ICD-10-CM | POA: Diagnosis not present

## 2020-04-09 DIAGNOSIS — Z880 Allergy status to penicillin: Secondary | ICD-10-CM | POA: Insufficient documentation

## 2020-04-09 DIAGNOSIS — R1010 Upper abdominal pain, unspecified: Secondary | ICD-10-CM | POA: Insufficient documentation

## 2020-04-09 DIAGNOSIS — Z9884 Bariatric surgery status: Secondary | ICD-10-CM | POA: Insufficient documentation

## 2020-04-09 DIAGNOSIS — E119 Type 2 diabetes mellitus without complications: Secondary | ICD-10-CM | POA: Diagnosis not present

## 2020-04-09 DIAGNOSIS — Z79890 Hormone replacement therapy (postmenopausal): Secondary | ICD-10-CM | POA: Insufficient documentation

## 2020-04-09 DIAGNOSIS — R109 Unspecified abdominal pain: Secondary | ICD-10-CM | POA: Diagnosis not present

## 2020-04-09 DIAGNOSIS — Z20822 Contact with and (suspected) exposure to covid-19: Secondary | ICD-10-CM | POA: Diagnosis not present

## 2020-04-09 DIAGNOSIS — I10 Essential (primary) hypertension: Secondary | ICD-10-CM | POA: Diagnosis not present

## 2020-04-09 DIAGNOSIS — Z886 Allergy status to analgesic agent status: Secondary | ICD-10-CM | POA: Insufficient documentation

## 2020-04-09 DIAGNOSIS — K3580 Unspecified acute appendicitis: Secondary | ICD-10-CM | POA: Diagnosis not present

## 2020-04-09 DIAGNOSIS — K3589 Other acute appendicitis without perforation or gangrene: Secondary | ICD-10-CM | POA: Diagnosis not present

## 2020-04-09 DIAGNOSIS — K353 Acute appendicitis with localized peritonitis, without perforation or gangrene: Secondary | ICD-10-CM | POA: Diagnosis not present

## 2020-04-09 HISTORY — PX: LAPAROSCOPIC APPENDECTOMY: SHX408

## 2020-04-09 LAB — COMPREHENSIVE METABOLIC PANEL
ALT: 16 U/L (ref 0–44)
AST: 14 U/L — ABNORMAL LOW (ref 15–41)
Albumin: 3.9 g/dL (ref 3.5–5.0)
Alkaline Phosphatase: 76 U/L (ref 38–126)
Anion gap: 11 (ref 5–15)
BUN: 22 mg/dL (ref 8–23)
CO2: 25 mmol/L (ref 22–32)
Calcium: 8.8 mg/dL — ABNORMAL LOW (ref 8.9–10.3)
Chloride: 103 mmol/L (ref 98–111)
Creatinine, Ser: 0.59 mg/dL (ref 0.44–1.00)
GFR, Estimated: >60 mL/min (ref >60)
Glucose, Bld: 129 mg/dL — ABNORMAL HIGH (ref 70–99)
Potassium: 4.4 mmol/L (ref 3.5–5.1)
Sodium: 139 mmol/L (ref 135–145)
Total Bilirubin: 0.4 mg/dL (ref 0.3–1.2)
Total Protein: 6.4 g/dL — ABNORMAL LOW (ref 6.5–8.1)

## 2020-04-09 LAB — RESP PANEL BY RT-PCR (FLU A&B, COVID) ARPGX2
Influenza A by PCR: NEGATIVE
Influenza B by PCR: NEGATIVE
SARS Coronavirus 2 by RT PCR: NEGATIVE

## 2020-04-09 LAB — LIPASE, BLOOD: Lipase: 28 U/L (ref 11–51)

## 2020-04-09 LAB — URINALYSIS, ROUTINE W REFLEX MICROSCOPIC
Bilirubin Urine: NEGATIVE
Glucose, UA: NEGATIVE mg/dL
Hgb urine dipstick: NEGATIVE
Ketones, ur: NEGATIVE mg/dL
Leukocytes,Ua: NEGATIVE
Nitrite: NEGATIVE
Protein, ur: NEGATIVE mg/dL
Specific Gravity, Urine: >1.046 — ABNORMAL HIGH (ref 1.005–1.030)
pH: 7 (ref 5.0–8.0)

## 2020-04-09 LAB — CBC
HCT: 39.8 % (ref 36.0–46.0)
Hemoglobin: 13.3 g/dL (ref 12.0–15.0)
MCH: 33.5 pg (ref 26.0–34.0)
MCHC: 33.4 g/dL (ref 30.0–36.0)
MCV: 100.3 fL — ABNORMAL HIGH (ref 80.0–100.0)
Platelets: 181 10*3/uL (ref 150–400)
RBC: 3.97 MIL/uL (ref 3.87–5.11)
RDW: 13.2 % (ref 11.5–15.5)
WBC: 10.1 10*3/uL (ref 4.0–10.5)
nRBC: 0 % (ref 0.0–0.2)

## 2020-04-09 SURGERY — APPENDECTOMY, LAPAROSCOPIC
Anesthesia: General | Site: Abdomen

## 2020-04-09 MED ORDER — GLYCOPYRROLATE 0.2 MG/ML IJ SOLN
INTRAMUSCULAR | Status: DC | PRN
  Administered 2020-04-09: .2 mg via INTRAVENOUS

## 2020-04-09 MED ORDER — FENTANYL CITRATE (PF) 100 MCG/2ML IJ SOLN
INTRAMUSCULAR | Status: AC
  Filled 2020-04-09: qty 2

## 2020-04-09 MED ORDER — SUGAMMADEX SODIUM 200 MG/2ML IV SOLN
INTRAVENOUS | Status: DC | PRN
  Administered 2020-04-09: 200 mg via INTRAVENOUS

## 2020-04-09 MED ORDER — BUPIVACAINE LIPOSOME 1.3 % IJ SUSP
INTRAMUSCULAR | Status: AC
  Filled 2020-04-09: qty 20

## 2020-04-09 MED ORDER — METRONIDAZOLE IN NACL 5-0.79 MG/ML-% IV SOLN
500.0000 mg | Freq: Once | INTRAVENOUS | Status: AC
  Administered 2020-04-09: 500 mg via INTRAVENOUS
  Filled 2020-04-09: qty 100

## 2020-04-09 MED ORDER — SUCCINYLCHOLINE CHLORIDE 200 MG/10ML IV SOSY
PREFILLED_SYRINGE | INTRAVENOUS | Status: AC
  Filled 2020-04-09: qty 10

## 2020-04-09 MED ORDER — ONDANSETRON HCL 4 MG/2ML IJ SOLN
INTRAMUSCULAR | Status: DC | PRN
  Administered 2020-04-09: 4 mg via INTRAVENOUS

## 2020-04-09 MED ORDER — MIDAZOLAM HCL 5 MG/5ML IJ SOLN
INTRAMUSCULAR | Status: DC | PRN
  Administered 2020-04-09: 2 mg via INTRAVENOUS

## 2020-04-09 MED ORDER — OXYCODONE HCL 5 MG PO TABS: 5.0000 mg | ORAL_TABLET | ORAL | 0 refills | Status: DC | PRN

## 2020-04-09 MED ORDER — CHLORHEXIDINE GLUCONATE 0.12 % MT SOLN
15.0000 mL | Freq: Once | OROMUCOSAL | Status: AC
  Administered 2020-04-09: 15 mL via OROMUCOSAL

## 2020-04-09 MED ORDER — DEXAMETHASONE SODIUM PHOSPHATE 4 MG/ML IJ SOLN
INTRAMUSCULAR | Status: DC | PRN
  Administered 2020-04-09: 10 mg via INTRAVENOUS

## 2020-04-09 MED ORDER — BUPIVACAINE LIPOSOME 1.3 % IJ SUSP
INTRAMUSCULAR | Status: DC | PRN
  Administered 2020-04-09: 20 mL

## 2020-04-09 MED ORDER — SODIUM CHLORIDE 0.9 % IV SOLN
2.0000 g | Freq: Once | INTRAVENOUS | Status: AC
  Administered 2020-04-09: 2 g via INTRAVENOUS
  Filled 2020-04-09: qty 20

## 2020-04-09 MED ORDER — PROPOFOL 10 MG/ML IV BOLUS
INTRAVENOUS | Status: AC
  Filled 2020-04-09: qty 20

## 2020-04-09 MED ORDER — ONDANSETRON 4 MG PO TBDP: 4.0000 mg | ORAL_TABLET | Freq: Three times a day (TID) | ORAL | 0 refills | Status: DC | PRN

## 2020-04-09 MED ORDER — GLYCOPYRROLATE PF 0.2 MG/ML IJ SOSY
PREFILLED_SYRINGE | INTRAMUSCULAR | Status: AC
  Filled 2020-04-09: qty 1

## 2020-04-09 MED ORDER — MIDAZOLAM HCL 2 MG/2ML IJ SOLN
INTRAMUSCULAR | Status: AC
  Filled 2020-04-09: qty 2

## 2020-04-09 MED ORDER — LACTATED RINGERS IV SOLN
INTRAVENOUS | Status: DC
  Administered 2020-04-09: 1000 mL via INTRAVENOUS

## 2020-04-09 MED ORDER — IOHEXOL 300 MG/ML  SOLN
100.0000 mL | Freq: Once | INTRAMUSCULAR | Status: AC | PRN
  Administered 2020-04-09: 100 mL via INTRAVENOUS

## 2020-04-09 MED ORDER — FENTANYL CITRATE (PF) 100 MCG/2ML IJ SOLN
25.0000 ug | INTRAMUSCULAR | Status: DC | PRN
  Administered 2020-04-09 (×2): 50 ug via INTRAVENOUS

## 2020-04-09 MED ORDER — SODIUM CHLORIDE 0.9 % IR SOLN
Status: DC | PRN
  Administered 2020-04-09: 1000 mL

## 2020-04-09 MED ORDER — ONDANSETRON HCL 4 MG/2ML IJ SOLN: 4.0000 mg | Freq: Once | INTRAMUSCULAR | Status: DC | PRN

## 2020-04-09 MED ORDER — FENTANYL CITRATE (PF) 100 MCG/2ML IJ SOLN
INTRAMUSCULAR | Status: DC | PRN
  Administered 2020-04-09 (×2): 50 ug via INTRAVENOUS

## 2020-04-09 MED ORDER — ROCURONIUM BROMIDE 10 MG/ML (PF) SYRINGE
PREFILLED_SYRINGE | INTRAVENOUS | Status: DC | PRN
  Administered 2020-04-09: 40 mg via INTRAVENOUS
  Administered 2020-04-09: 10 mg via INTRAVENOUS

## 2020-04-09 MED ORDER — EPHEDRINE 5 MG/ML INJ
INTRAVENOUS | Status: AC
  Filled 2020-04-09: qty 10

## 2020-04-09 MED ORDER — ONDANSETRON HCL 4 MG/2ML IJ SOLN
4.0000 mg | Freq: Once | INTRAMUSCULAR | Status: AC
  Administered 2020-04-09: 4 mg via INTRAVENOUS
  Filled 2020-04-09: qty 2

## 2020-04-09 MED ORDER — ORAL CARE MOUTH RINSE: 15.0000 mL | Freq: Once | OROMUCOSAL | Status: AC

## 2020-04-09 MED ORDER — LIDOCAINE HCL (PF) 2 % IJ SOLN
INTRAMUSCULAR | Status: AC
  Filled 2020-04-09: qty 5

## 2020-04-09 MED ORDER — ROCURONIUM BROMIDE 10 MG/ML (PF) SYRINGE
PREFILLED_SYRINGE | INTRAVENOUS | Status: AC
  Filled 2020-04-09: qty 10

## 2020-04-09 MED ORDER — ONDANSETRON HCL 4 MG/2ML IJ SOLN
INTRAMUSCULAR | Status: AC
  Filled 2020-04-09: qty 2

## 2020-04-09 MED ORDER — CHLORHEXIDINE GLUCONATE CLOTH 2 % EX PADS: 6.0000 | MEDICATED_PAD | Freq: Once | CUTANEOUS | Status: DC

## 2020-04-09 MED ORDER — MORPHINE SULFATE (PF) 4 MG/ML IV SOLN
4.0000 mg | Freq: Once | INTRAVENOUS | Status: AC
  Administered 2020-04-09: 4 mg via INTRAVENOUS
  Filled 2020-04-09: qty 1

## 2020-04-09 MED ORDER — SODIUM CHLORIDE 0.9 % IV SOLN
INTRAVENOUS | Status: AC
  Filled 2020-04-09: qty 2

## 2020-04-09 MED ORDER — LACTATED RINGERS IV BOLUS
1000.0000 mL | Freq: Once | INTRAVENOUS | Status: AC
  Administered 2020-04-09: 1000 mL via INTRAVENOUS

## 2020-04-09 MED ORDER — SODIUM CHLORIDE 0.9 % IV SOLN
1.0000 g | INTRAVENOUS | Status: AC
  Administered 2020-04-09: 1 g via INTRAVENOUS

## 2020-04-09 MED ORDER — DEXAMETHASONE SODIUM PHOSPHATE 10 MG/ML IJ SOLN
INTRAMUSCULAR | Status: AC
  Filled 2020-04-09: qty 1

## 2020-04-09 MED ORDER — SODIUM CHLORIDE 0.9 % IV SOLN
INTRAVENOUS | Status: AC
  Filled 2020-04-09: qty 1

## 2020-04-09 MED ORDER — SUCCINYLCHOLINE CHLORIDE 20 MG/ML IJ SOLN
INTRAMUSCULAR | Status: DC | PRN
  Administered 2020-04-09: 140 mg via INTRAVENOUS

## 2020-04-09 MED ORDER — EPHEDRINE SULFATE 50 MG/ML IJ SOLN
INTRAMUSCULAR | Status: DC | PRN
  Administered 2020-04-09: 10 mg via INTRAVENOUS

## 2020-04-09 MED ORDER — PROPOFOL 10 MG/ML IV BOLUS
INTRAVENOUS | Status: DC | PRN
  Administered 2020-04-09: 160 mg via INTRAVENOUS

## 2020-04-09 MED ORDER — LIDOCAINE HCL (CARDIAC) PF 100 MG/5ML IV SOSY
PREFILLED_SYRINGE | INTRAVENOUS | Status: DC | PRN
  Administered 2020-04-09: 100 mg via INTRAVENOUS

## 2020-04-09 SURGICAL SUPPLY — 53 items
ADH SKN CLS APL DERMABOND .7 (GAUZE/BANDAGES/DRESSINGS) ×1
APL PRP STRL LF DISP 70% ISPRP (MISCELLANEOUS) ×1
BAG RETRIEVAL 10 (BASKET) ×1
BLADE SURG 15 STRL LF DISP TIS (BLADE) ×1 IMPLANT
BLADE SURG 15 STRL SS (BLADE) ×2
CHLORAPREP W/TINT 26 (MISCELLANEOUS) ×2 IMPLANT
CLOTH BEACON ORANGE TIMEOUT ST (SAFETY) ×2 IMPLANT
COVER LIGHT HANDLE STERIS (MISCELLANEOUS) ×4 IMPLANT
COVER WAND RF STERILE (DRAPES) ×2 IMPLANT
CUTTER FLEX LINEAR 45M (STAPLE) ×2 IMPLANT
DERMABOND ADVANCED (GAUZE/BANDAGES/DRESSINGS) ×1
DERMABOND ADVANCED .7 DNX12 (GAUZE/BANDAGES/DRESSINGS) ×1 IMPLANT
ELECT REM PT RETURN 9FT ADLT (ELECTROSURGICAL) ×2
ELECTRODE REM PT RTRN 9FT ADLT (ELECTROSURGICAL) ×1 IMPLANT
GLOVE BIO SURGEON STRL SZ 6.5 (GLOVE) ×2 IMPLANT
GLOVE BIO SURGEON STRL SZ7 (GLOVE) ×1 IMPLANT
GLOVE BIOGEL PI IND STRL 6.5 (GLOVE) ×1 IMPLANT
GLOVE BIOGEL PI IND STRL 7.0 (GLOVE) ×3 IMPLANT
GLOVE BIOGEL PI INDICATOR 6.5 (GLOVE) ×1
GLOVE BIOGEL PI INDICATOR 7.0 (GLOVE) ×3
GOWN STRL REUS W/TWL LRG LVL3 (GOWN DISPOSABLE) ×4 IMPLANT
INST SET LAPROSCOPIC AP (KITS) ×2 IMPLANT
KIT TURNOVER KIT A (KITS) ×2 IMPLANT
MANIFOLD NEPTUNE II (INSTRUMENTS) ×2 IMPLANT
NDL HYPO 18GX1.5 BLUNT FILL (NEEDLE) ×1 IMPLANT
NDL HYPO 21X1.5 SAFETY (NEEDLE) ×1 IMPLANT
NDL INSUFFLATION 14GA 120MM (NEEDLE) ×1 IMPLANT
NEEDLE HYPO 18GX1.5 BLUNT FILL (NEEDLE) ×2 IMPLANT
NEEDLE HYPO 21X1.5 SAFETY (NEEDLE) ×2 IMPLANT
NEEDLE INSUFFLATION 14GA 120MM (NEEDLE) ×2 IMPLANT
NS IRRIG 1000ML POUR BTL (IV SOLUTION) ×2 IMPLANT
PACK LAP CHOLE LZT030E (CUSTOM PROCEDURE TRAY) ×2 IMPLANT
PAD ARMBOARD 7.5X6 YLW CONV (MISCELLANEOUS) ×2 IMPLANT
RELOAD 45 VASCULAR/THIN (ENDOMECHANICALS) IMPLANT
RELOAD STAPLE 45 2.5 WHT GRN (ENDOMECHANICALS) IMPLANT
RELOAD STAPLE 45 3.5 BLU ETS (ENDOMECHANICALS) IMPLANT
RELOAD STAPLE TA45 3.5 REG BLU (ENDOMECHANICALS) ×2 IMPLANT
SET BASIN LINEN APH (SET/KITS/TRAYS/PACK) ×2 IMPLANT
SET TUBE IRRIG SUCTION NO TIP (IRRIGATION / IRRIGATOR) IMPLANT
SET TUBE SMOKE EVAC HIGH FLOW (TUBING) ×2 IMPLANT
SHEARS HARMONIC ACE PLUS 36CM (ENDOMECHANICALS) ×2 IMPLANT
SUT MNCRL AB 4-0 PS2 18 (SUTURE) ×4 IMPLANT
SUT VICRYL 0 UR6 27IN ABS (SUTURE) ×2 IMPLANT
SYR 20ML LL LF (SYRINGE) ×4 IMPLANT
SYS BAG RETRIEVAL 10MM (BASKET) ×1
SYSTEM BAG RETRIEVAL 10MM (BASKET) ×1 IMPLANT
TRAY FOLEY W/BAG SLVR 16FR (SET/KITS/TRAYS/PACK) ×2
TRAY FOLEY W/BAG SLVR 16FR ST (SET/KITS/TRAYS/PACK) ×1 IMPLANT
TROCAR ENDO BLADELESS 11MM (ENDOMECHANICALS) ×2 IMPLANT
TROCAR ENDO BLADELESS 12MM (ENDOMECHANICALS) ×2 IMPLANT
TROCAR XCEL NON-BLD 5MMX100MML (ENDOMECHANICALS) ×2 IMPLANT
WARMER LAPAROSCOPE (MISCELLANEOUS) ×2 IMPLANT
YANKAUER SUCT 12FT TUBE ARGYLE (SUCTIONS) ×2 IMPLANT

## 2020-04-09 NOTE — H&P (Signed)
[facility] History and Physical  Reason for Referral: Acute appendicitis  Referring Physician:  Dr. Blinda Leatherwood   Chief Complaint    Abdominal Pain      Melissa Pitts is a 60 y.o. female.  HPI: Melissa Pitts is a 60 yo who has hypothyroidism, depression and has a history of gastric bypass and complication of a leak that was done at [facility] in 2014.  Melissa Pitts reports that Melissa Pitts has been having periumbilical pain that started yesterday afternoon. Melissa Pitts has had some nausea and dry heaving.   Melissa Pitts says Melissa Pitts has not had any issues with ulcers and does not take NSAIDs. Melissa Pitts knows about gallstones that Melissa Pitts has had in the past.  Melissa Pitts says that Melissa Pitts pain is more epigastric periumbilical but Melissa Pitts is most tender in the RLQ.  Melissa Pitts was worked up in the ED and CT demonstrates enlarged appendix with appendicolith and inflammation stranding around. Melissa Pitts has had prior colonoscopy.   Past Medical History:  Diagnosis Date  . Allergy   . Breast mass   . Depression   . Diabetes mellitus without complication (HCC)    history of, had gastric bypass, no longer needs medications  . Gallstone   . History of kidney stones   . Hypertension    history of before gastric- by-pass  . Hypothyroidism   . Sleep apnea    wears CPAP- no longer needs since bypass  . Thyroid disease    hypothyroid    Past Surgical History:  Procedure Laterality Date  . bypass leak repair  11/21/12  . EXTRACORPOREAL SHOCK WAVE LITHOTRIPSY Right 04/26/2018   Procedure: EXTRACORPOREAL SHOCK WAVE LITHOTRIPSY (ESWL);  Surgeon: Malen Gauze, MD;  Location: [facility];  Service: Urology;  Laterality: Right;  . EXTRACORPOREAL SHOCK WAVE LITHOTRIPSY Right 06/11/2018   Procedure: EXTRACORPOREAL SHOCK WAVE LITHOTRIPSY (ESWL);  Surgeon: Malen Gauze, MD;  Location: [facility];  Service: Urology;  Laterality: Right;  . GASTRIC BYPASS  11/14/12  . HERNIA REPAIR  5/15  . VAGINAL HYSTERECTOMY  8/07    Family History  Problem Relation Age of  Onset  . Hypertension Mother   . Breast cancer Mother   . Cancer Father   . Heart disease Father        bypass  . Diabetes Father   . Breast cancer Maternal Grandmother           . Diabetes Paternal Grandfather   . Congestive Heart Failure Maternal Aunt   . Colon cancer Neg Hx   . Esophageal cancer Neg Hx   . Rectal cancer Neg Hx   . Stomach cancer Neg Hx     Social History   Tobacco Use  . Smoking status: Never Smoker  . Smokeless tobacco: Never Used  Vaping Use  . Vaping Use: Never used  Substance Use Topics  . Alcohol use: No  . Drug use: No    Medications: I have reviewed the patient's current medications. Current Facility-Administered Medications  Medication Dose Route Frequency Provider Last Rate Last Admin  . Chlorhexidine Gluconate Cloth 2 % PADS 6 each  6 each Topical Once Lucretia Roers, MD      . ertapenem [facility]) 1,000 mg in sodium chloride 0.9 % 100 mL IVPB  1 g Intravenous On Call to OR Lucretia Roers, MD      . lactated ringers infusion   Intravenous Continuous Windell Norfolk, MD 10 mL/hr at 04/09/20 0741 1,000 mL at 04/09/20 0741  . sodium  chloride irrigation 0.9 %    PRN Lucretia Roers, MD   1,000 mL at 04/09/20 0744   Medications Prior to Admission  Medication Sig Dispense Refill  . CALCIUM PO Take 1 tablet by mouth 2 (two) times daily.    . cholecalciferol (VITAMIN D3) 25 MCG (1000 UT) tablet Take 1,000 Units by mouth daily.    . Cyanocobalamin (B-12 SL) Place 1 tablet under the tongue daily.    Marland Kitchen escitalopram (LEXAPRO) 20 MG tablet Take 20 mg by mouth at bedtime.     Marland Kitchen levothyroxine (SYNTHROID, LEVOTHROID) 50 MCG tablet Take 1 tablet by mouth every evening.     . ondansetron (ZOFRAN ODT) 4 MG disintegrating tablet Take 1 tablet (4 mg total) by mouth every 8 (eight) hours as needed for nausea. 30 tablet 0  . promethazine (PHENERGAN) 12.5 MG tablet TAKE (1) TABLET BY MOUTH EVERY EIGHT HOURS AS NEEDED FOR NAUSEA OR VOMITING. 30 tablet 0  .  azelastine (ASTELIN) 0.1 % nasal spray Place 1 spray into both nostrils 2 (two) times daily.     Allergies  Allergen Reactions  . Nsaids Other (See Comments)    Related to surgical history   . Sulfamethoxazole Swelling  . Penicillins Rash    Did it involve swelling of the face/tongue/throat, SOB, or low BP? No Did it involve sudden or severe rash/hives, skin peeling, or any reaction on the inside of your mouth or nose? Yes Did you need to seek medical attention at a hospital or doctor's office? No When did it last happen?Over 10 years If all above answers are "NO", may proceed with cephalosporin use.   . Sulfur Rash      ROS:  A comprehensive review of systems was negative except for: Gastrointestinal: positive for abdominal pain and nausea  Blood pressure (!) 143/63, pulse 76, temperature 98.1 F (36.7 C), temperature source Oral, resp. rate 14, height 5\' 3"  (1.6 m), weight 79.4 kg, last menstrual period 04/04/2005, SpO2 94 %. Physical Exam Vitals reviewed.  HENT:     Head: Normocephalic.  Eyes:     Extraocular Movements: Extraocular movements intact.  Cardiovascular:     Rate and Rhythm: Normal rate.  Pulmonary:     Effort: Pulmonary effort is normal.     Breath sounds: Normal breath sounds.  Abdominal:     General: There is no distension.     Palpations: Abdomen is soft.     Tenderness: There is abdominal tenderness in the right lower quadrant and periumbilical area.     Hernia: No hernia is present.     Comments: Port sites, RLQ tenderness > periumbilical; + obturator   Musculoskeletal:     Comments: Moves all extremities   Skin:    General: Skin is warm and dry.  Neurological:     General: No focal deficit present.     Mental Status: Melissa Pitts is alert and oriented to person, place, and time.  Psychiatric:        Mood and Affect: Mood normal.        Behavior: Behavior normal.     Results: Results for orders placed or performed during the hospital encounter  of 04/09/20 (from the past 48 hour(s))  Lipase, blood     Status: None   Collection Time: 04/09/20  2:35 AM  Result Value Ref Range   Lipase 28 11 - 51 U/L    Comment: Performed at [facility], [address] [place], [place]  Comprehensive metabolic panel  Status: Abnormal   Collection Time: 04/09/20  2:35 AM  Result Value Ref Range   Sodium 139 135 - 145 mmol/L   Potassium 4.4 3.5 - 5.1 mmol/L   Chloride 103 98 - 111 mmol/L   CO2 25 22 - 32 mmol/L   Glucose, Bld 129 (H) 70 - 99 mg/dL    Comment: Glucose reference range applies only to samples taken after fasting for at least 8 hours.   BUN 22 8 - 23 mg/dL   Creatinine, Ser 5.18 0.44 - 1.00 mg/dL   Calcium 8.8 (L) 8.9 - 10.3 mg/dL   Total Protein 6.4 (L) 6.5 - 8.1 g/dL   Albumin 3.9 3.5 - 5.0 g/dL   AST 14 (L) 15 - 41 U/L   ALT 16 0 - 44 U/L   Alkaline Phosphatase 76 38 - 126 U/L   Total Bilirubin 0.4 0.3 - 1.2 mg/dL   GFR, Estimated >84 >16 mL/min    Comment: (NOTE) Calculated using the CKD-EPI Creatinine Equation (2021)    Anion gap 11 5 - 15    Comment: Performed at [facility], [address] [place], [place] [address]  CBC     Status: Abnormal   Collection Time: 04/09/20  2:35 AM  Result Value Ref Range   WBC 10.1 4.0 - 10.5 K/uL   RBC 3.97 3.87 - 5.11 MIL/uL   Hemoglobin 13.3 12.0 - 15.0 g/dL   HCT 16.0 10.9 - 32.3 %   MCV 100.3 (H) 80.0 - 100.0 fL   MCH 33.5 26.0 - 34.0 pg   MCHC 33.4 30.0 - 36.0 g/dL   RDW 55.7 32.2 - 02.5 %   Platelets 181 150 - 400 K/uL   nRBC 0.0 0.0 - 0.2 %    Comment: Performed at [facility], [address], [place], [place] [address]  Resp Panel by RT-PCR (Flu A&B, Covid) Nasopharyngeal Swab     Status: None   Collection Time: 04/09/20  2:53 AM   Specimen: Nasopharyngeal Swab; Nasopharyngeal(NP) swabs in vial transport medium  Result Value Ref Range   SARS Coronavirus 2 by RT PCR NEGATIVE NEGATIVE    Comment: (NOTE) SARS-CoV-2 target nucleic acids are NOT  DETECTED.  The SARS-CoV-2 RNA is generally detectable in upper respiratory specimens during the acute phase of infection. The lowest concentration of SARS-CoV-2 viral copies this assay can detect is 138 copies/mL. A negative result does not preclude SARS-Cov-2 infection and should not be used as the sole basis for treatment or other patient management decisions. A negative result may occur with  improper specimen collection/handling, submission of specimen other than nasopharyngeal swab, presence of viral mutation(s) within the areas targeted by this assay, and inadequate number of viral copies(<138 copies/mL). A negative result must be combined with clinical observations, patient history, and epidemiological information. The expected result is Negative.  Fact Sheet for Patients:  BloggerCourse.com  Fact Sheet for Healthcare Providers:  SeriousBroker.it  This test is no t yet approved or cleared by the [place] FDA and  has been authorized for detection and/or diagnosis of SARS-CoV-2 by FDA under an Emergency Use Authorization (EUA). This EUA will remain  in effect (meaning this test can be used) for the duration of the COVID-19 declaration under Section 564(b)(1) of the Act, 21 U.S.C.section 360bbb-3(b)(1), unless the authorization is terminated  or revoked sooner.       Influenza A by PCR NEGATIVE NEGATIVE   Influenza B by PCR NEGATIVE NEGATIVE    Comment: (NOTE) The  Xpert Xpress SARS-CoV-2/FLU/RSV plus assay is intended as an aid in the diagnosis of influenza from Nasopharyngeal swab specimens and should not be used as a sole basis for treatment. Nasal washings and aspirates are unacceptable for Xpert Xpress SARS-CoV-2/FLU/RSV testing.  Fact Sheet for Patients: EntrepreneurPulse.com.au  Fact Sheet for Healthcare Providers: IncredibleEmployment.be  This test is not yet approved or  cleared by the [place] FDA and has been authorized for detection and/or diagnosis of SARS-CoV-2 by FDA under an Emergency Use Authorization (EUA). This EUA will remain in effect (meaning this test can be used) for the duration of the COVID-19 declaration under Section 564(b)(1) of the Act, 21 U.S.C. section 360bbb-3(b)(1), unless the authorization is terminated or revoked.  Performed at [facility], [address], [place], [place] [address]   Urinalysis, Routine w reflex microscopic     Status: Abnormal   Collection Time: 04/09/20  5:07 AM  Result Value Ref Range   Color, Urine YELLOW YELLOW   APPearance CLEAR CLEAR   Specific Gravity, Urine >1.046 (H) 1.005 - 1.030   pH 7.0 5.0 - 8.0   Glucose, UA NEGATIVE NEGATIVE mg/dL   Hgb urine dipstick NEGATIVE NEGATIVE   Bilirubin Urine NEGATIVE NEGATIVE   Ketones, ur NEGATIVE NEGATIVE mg/dL   Protein, ur NEGATIVE NEGATIVE mg/dL   Nitrite NEGATIVE NEGATIVE   Leukocytes,Ua NEGATIVE NEGATIVE    Comment: Performed at [facility], [address], [place], [place] [address]   Personally reviewed and reviewed CT 2020- enlarged appendix to 1.5cm and stranding and haziness around it compared to prior  CT ABDOMEN PELVIS W CONTRAST  Result Date: 04/09/2020 CLINICAL DATA:  Acute abdominal pain EXAM: CT ABDOMEN AND PELVIS WITH CONTRAST TECHNIQUE: Multidetector CT imaging of the abdomen and pelvis was performed using the standard protocol following bolus administration of intravenous contrast. CONTRAST:  143mL OMNIPAQUE IOHEXOL 300 MG/ML  SOLN COMPARISON:  04/18/2018 FINDINGS: Lower chest: Gastric bypass with mild distention of the lower esophagus. Hepatobiliary: No focal liver abnormality.Full gallbladder with stone near the neck. No pericholecystic edema. No bile duct dilatation. Pancreas: Unremarkable. Spleen: Unremarkable. Adrenals/Urinary Tract: Negative adrenals. No hydronephrosis or stone. Left renal sinus cyst. Unremarkable bladder.  Stomach/Bowel: Gastric bypass without noted related obstruction or visible inflammation. The appendix has dilated from before with appendicolith at the base and mesoappendiceal stranding that is convincing on coronal reformats. The appendix is in the right lower quadrant and extends medially from the cecum. Outer wall diameter is 11 mm. Vascular/Lymphatic: No acute vascular abnormality. No mass or adenopathy. Reproductive:Hysterectomy Other: No ascites or pneumoperitoneum. Musculoskeletal: No acute abnormalities. Focal L4-5 disc degeneration. 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 T11 hemangioma. IMPRESSION: 1. In the appropriate clinical setting, findings consistent with early acute appendicitis. An appendicolith is present. 2. Cholelithiasis. Electronically Signed   By: Monte Fantasia M.D.   On: 04/09/2020 04:13     Assessment & Plan:  GIANELLE MCCAUL is a 60 y.o. female with signs of early appendicitis on CT and tenderness mostly in the epigastric to RLQ. The RLQ is more severe than the epigastric. Melissa Pitts does not take NSAIDs but has a history of bypass. Melissa Pitts has findings of haziness and an enlarged appendix. We discussed that this appendix could be normal but based on Melissa Pitts symptoms and acute onset it is likely appendicitis. Discussed that if Melissa Pitts still had pain that Melissa Pitts would likely need EGD. Discussed Melissa Pitts prior surgery and complication with leak and Melissa Pitts is not sure which anastomosis leaked. Melissa Pitts did have to  have a feeding tube for a while so I wonder if it was the GJ and they fed through the remnant.   COVID negative.  Discussed the risk of laparoscopic appendectomy and the option of antibiotics alone. Discussed that in [place] and some trials in the [place], antibiotics are used for simple appendicitis. Discussed that research shows a 40% failure rate for antibiotics alone.  Discussed risk of surgery including but not limited to bleeding, infection, injury to other organs, normal appendix, or an open surgery and  after this discussion the patient has decided to proceed with laparoscopic appendectomy.    All questions were answered to the satisfaction of the patient.   Lucretia Roers 04/09/2020, 8:08 AM

## 2020-04-09 NOTE — Discharge Instructions (Signed)
General Anesthesia, Adult, Care After This sheet gives you information about how to care for yourself after your procedure. Your health care provider may also give you more specific instructions. If you have problems or questions, contact your health care provider. What can I expect after the procedure? After the procedure, the following side effects are common:  Pain or discomfort at the IV site.  Nausea.  Vomiting.  Sore throat.  Trouble concentrating.  Feeling cold or chills.  Weak or tired.  Sleepiness and fatigue.  Soreness and body aches. These side effects can affect parts of the body that were not involved in surgery. Follow these instructions at home:  For at least 24 hours after the procedure:  Have a responsible adult stay with you. It is important to have someone help care for you until you are awake and alert.  Rest as needed.  Do not: ? Participate in activities in which you could fall or become injured. ? Drive. ? Use heavy machinery. ? Drink alcohol. ? Take sleeping pills or medicines that cause drowsiness. ? Make important decisions or sign legal documents. ? Take care of children on your own. Eating and drinking  Follow any instructions from your health care provider about eating or drinking restrictions.  When you feel hungry, start by eating small amounts of foods that are soft and easy to digest (bland), such as toast. Gradually return to your regular diet.  Drink enough fluid to keep your urine pale yellow.  If you vomit, rehydrate by drinking water, juice, or clear broth. General instructions  If you have sleep apnea, surgery and certain medicines can increase your risk for breathing problems. Follow instructions from your health care provider about wearing your sleep device: ? Anytime you are sleeping, including during daytime naps. ? While taking prescription pain medicines, sleeping medicines, or medicines that make you drowsy.  Return to  your normal activities as told by your health care provider. Ask your health care provider what activities are safe for you.  Take over-the-counter and prescription medicines only as told by your health care provider.  If you smoke, do not smoke without supervision.  Keep all follow-up visits as told by your health care provider. This is important. Contact a health care provider if:  You have nausea or vomiting that does not get better with medicine.  You cannot eat or drink without vomiting.  You have pain that does not get better with medicine.  You are unable to pass urine.  You develop a skin rash.  You have a fever.  You have redness around your IV site that gets worse. Get help right away if:  You have difficulty breathing.  You have chest pain.  You have blood in your urine or stool, or you vomit blood. Summary  After the procedure, it is common to have a sore throat or nausea. It is also common to feel tired.  Have a responsible adult stay with you for the first 24 hours after general anesthesia. It is important to have someone help care for you until you are awake and alert.  When you feel hungry, start by eating small amounts of foods that are soft and easy to digest (bland), such as toast. Gradually return to your regular diet.  Drink enough fluid to keep your urine pale yellow.  Return to your normal activities as told by your health care provider. Ask your health care provider what activities are safe for you. This information is not   intended to replace advice given to you by your health care provider. Make sure you discuss any questions you have with your health care provider. Document Revised: 03/24/2017 Document Reviewed: 11/04/2016 Elsevier Patient Education  [employer]. Discharge Laparoscopic Surgery Instructions:  Common Complaints: Right shoulder pain is common after laparoscopic surgery. This is secondary to the gas used in the surgery being  trapped under the diaphragm.  Walk to help your body absorb the gas. This will improve in a few days. Pain at the port sites are common, especially the larger port sites. This will improve with time.  Some nausea is common and poor appetite. The main goal is to stay hydrated the first few days after surgery.   Diet/ Activity: Diet as tolerated. You may not have an appetite, but it is important to stay hydrated. Drink 64 ounces of water a day. Your appetite will return with time.  Shower per your regular routine daily.  Do not take hot showers. Take warm showers that are less than 10 minutes. Rest and listen to your body, but do not remain in bed all day.  Walk everyday for at least 15-20 minutes. Deep cough and move around every 1-2 hours in the first few days after surgery.  Do not lift > 10 lbs, perform excessive bending, pushing, pulling, squatting for 1-2 weeks after surgery.  Do not pick at the dermabond glue on your incision sites.  This glue film will remain in place for 1-2 weeks and will start to peel off.  Do not place lotions or balms on your incision unless instructed to specifically by Dr. Constance Haw.   Pain Expectations and Narcotics: -After surgery you will have pain associated with your incisions and this is normal. The pain is muscular and nerve pain, and will get better with time. -You are encouraged and expected to take non narcotic medications like tylenol and ibuprofen (when able) to treat pain as multiple modalities can aid with pain treatment. -Narcotics are only used when pain is severe or there is breakthrough pain. -You are not expected to have a pain score of 0 after surgery, as we cannot prevent pain. A pain score of 3-4 that allows you to be functional, move, walk, and tolerate some activity is the goal. The pain will continue to improve over the days after surgery and is dependent on your surgery. -Due to [place] law, we are only able to give a certain amount of pain  medication to treat post operative pain, and we only give additional narcotics on a patient by patient basis.  -For most laparoscopic surgery, studies have shown that the majority of patients only need 10-15 narcotic pills, and for open surgeries most patients only need 15-20.   -Having appropriate expectations of pain and knowledge of pain management with non narcotics is important as we do not want anyone to become addicted to narcotic pain medication.  -Using ice packs in the first 48 hours and heating pads after 48 hours, wearing an abdominal binder (when recommended), and using over the counter medications are all ways to help with pain management.   -Simple acts like meditation and mindfulness practices after surgery can also help with pain control and research has proven the benefit of these practices.  Medication: Take tylenol as needed for pain control, alternating every 4-6 hours.  Example:  Tylenol 1000mg  @ 6am, 12noon, 6pm, 22midnight (Do not exceed 4000mg  of tylenol a day) Take Roxicodone for breakthrough pain every 4 hours.  Take  Colace for constipation related to narcotic pain medication. If you do not have a bowel movement in 2 days, take Miralax over the counter.  Drink plenty of water to also prevent constipation.   Contact Information: If you have questions or concerns, please call our office, [phone], Monday- Thursday 8AM-5PM and Friday 8AM-12Noon.  If it is after hours or on the weekend, please call [facility], [phone], and ask to speak to the surgeon on call for Dr. Henreitta Leber at [facility].    Laparoscopic Appendectomy, Adult, Care After This sheet gives you information about how to care for yourself after your procedure. Your doctor may also give you more specific instructions. If you have problems or questions, contact your doctor. What can I expect after the procedure? After the procedure, it is common to have:  Little energy for normal  activities.  Mild pain in the area where the cuts from surgery (incisions) were made.  Trouble pooping (constipation). This can be caused by: ? Pain medicine. ? A lack of activity. Follow these instructions at home: Medicines  Take over-the-counter and prescription medicines only as told by your doctor.  If you were prescribed an antibiotic medicine, take it as told by your doctor. Do not stop taking it even if you start to feel better.  Do not drive or use heavy machinery while taking prescription pain medicine.  Ask your doctor if the medicine you are taking can cause trouble pooping. You may need to take steps to prevent or treat trouble pooping: ? Drink enough fluid to keep your pee (urine) pale yellow. ? Take over-the-counter or prescription medicines. ? Eat foods that are high in fiber. These include beans, whole grains, and fresh fruits and vegetables. ? Limit foods that are high in fat and sugar. These include fried or sweet foods. Incision care   Follow instructions from your doctor about how to take care of your cuts from surgery. Make sure you: ? Wash your hands with soap and water before and after you change your bandage (dressing). If you cannot use soap and water, use hand sanitizer. ? Change your bandage as told by your doctor. ? Leave stitches (sutures), skin glue, or skin tape (adhesive) strips in place. They may need to stay in place for 2 weeks or longer. If tape strips get loose and curl up, you may trim the loose edges. Do not remove tape strips completely unless your doctor says it is okay.  Check your cuts from surgery every day for signs of infection. Check for: ? Redness, swelling, or pain. ? Fluid or blood. ? Warmth. ? Pus or a bad smell. Bathing  Keep your cuts from surgery clean and dry. Clean them as told by your doctor. To do this: 1. Gently wash the cuts with soap and water. 2. Rinse the cuts with water to remove all soap. 3. Pat the cuts dry with  a clean towel. Do not rub the cuts. Do not take baths, swim, or use a hot tub for 2 weeks, or until your doctor says it is okay.  YOU MAY SHOWER TOMORROW. Activity   Do not drive for 24 hours if you were given a medicine to help you relax (sedative) during your procedure.  Rest after the procedure. Return to your normal activities as told by your doctor. Ask your doctor what activities are safe for you. ? Do not lift anything that is heavier than 10 lb (4.5 kg) FOR ABOUT 1 WEEK ? Do not  play contact sports FOR ABOUT 2-3 WEEKS  General instructions  If you were sent home with a drain, follow instructions from your doctor on how to care for it.  Take deep breaths. This helps to keep your lungs from getting an infection (pneumonia).  Keep all follow-up visits as told by your doctor. This is important. Contact a doctor if:  You have redness, swelling, or pain around a cut from surgery.  You have fluid or blood coming from a cut.  Your cut feels warm to the touch.  You have pus or a bad smell coming from a cut or a bandage.  The edges of a cut break open after the stitches have been taken out.  You have pain in your shoulders that gets worse.  You feel dizzy or you pass out (faint).  You have shortness of breath.  You keep feeling sick to your stomach (nauseous).  You keep throwing up (vomiting).  You get watery poop (diarrhea) or you cannot control your poop.  You lose your appetite.  You have swelling or pain in your legs.  You get a rash. Get help right away if:  You have a fever.  You have trouble breathing.  You have sharp pains in your chest. Summary  After the procedure, it is common to have low energy, mild pain, and trouble pooping.  Infection is a common problem after this procedure. Follow your doctor's instructions about caring for yourself after the procedure.  Rest after the procedure. Return to your normal activities as told by your  doctor.  Contact your doctor if you see signs of infection around your cuts from surgery, or you get short of breath. Get help right away if you have a fever, chest pain, or trouble breathing. This information is not intended to replace advice given to you by your health care provider. Make sure you discuss any questions you have with your health care provider. Document Revised: 09/21/2017 Document Reviewed: 09/21/2017 Elsevier Patient Education  2020 [employer].

## 2020-04-09 NOTE — ED Notes (Signed)
Lab in room.

## 2020-04-09 NOTE — ED Notes (Signed)
Pt return from ct

## 2020-04-09 NOTE — Progress Notes (Signed)
[facility]  Updated husband. Acute appendicitis without perforation. Some scar in upper abdomen. Took photos for her to have. JJ looked good.  Rx sent to [facility].  Algis Greenhouse, MD [facility] [address] [place], [place] [address] [phone] (office)

## 2020-04-09 NOTE — Op Note (Addendum)
[facility]  Date of Surgery: 04/09/2020  Admit Date: 04/09/2020   Performing Service: General  Surgeon(s) and Role:    * Lucretia Roers, MD - Primary   Pre-operative Diagnosis: Acute Appendicitis  Post-operative Diagnosis: Acute Appendicitis  Procedure Performed: Laparoscopic Appendectomy   Surgeon: Leatrice Jewels. Henreitta Leber, MD   Assistant: No qualified resident was available.   Anesthesia: General   Findings:  The appendix was found to be inflamed. There were not signs of necrosis. There was not perforation. There was not abscess formation.   Estimated Blood Loss: Minimal   Specimens:  ID Type Source Tests Collected by Time Destination  1 : appendix Tissue PATH Appendix SURGICAL PATHOLOGY Lucretia Roers, MD 04/09/2020 [id number]        Complications: None; patient tolerated the procedure well.   Disposition: PACU - hemodynamically stable.   Condition: stable   Indications: The patient presented with a 1 day history of right-sided abdominal pain. A CT revealed findings consistent with acute appendicitis.   Procedure Details  Prior to the procedure, the risks, benefits, complications, treatment options, and expected outcomes were discussed with the patient and/or family, including but not limited to the risk of bleeding, infection, finding of a normal appendix, and the need for conversion to an open procedure. There was concurrence with the proposed plan and informed consent was obtained. The patient was taken to the operating room, identified as Sherin Quarry and the procedure verified as Laproscopic Appendectomy.    The patient was placed in the supine position and general anesthesia was induced, along with placement of orogastric tube, SCD's, and a Foley catheter. The abdomen was prepped and draped in a sterile fashion. The abdomen was entered with Veress technique in the infraumbilical incision. Intraperitoneal placement was confirmed with saline drop, low  entry pressures, and easy insufflation. A 11 mm optiview trocar was placed under direct visualization with a 0 degree scope. The 10 mm 0 degree scope was placed in the abdomen and no evidence of injury was identified. A 12 mm port was placed in the left lower quadrant of the abdomen after skin incision with trocar placement under direct vision. A careful evaluation of the entire abdomen was carried out. An additional 5 mm port was placed in the suprapubic area under direct vision.  The patient was placed in Trendelenburg and left lateral decubitus position. The small intestines were retracted in the cephalad and left lateral direction away from the pelvis and right lower quadrant. The patient was found to have an dilated and inflamed appearing appendix appendix. There was not evidence of perforation.  The upper abdomen had some scar tissue and her gallbladder was distended but not inflamed (see photos). Her left abdomen had omentum up to the anterior abdominal wall and at first I thought it herniated but I looked on the CT and no fat was seen through a defect on that side.  She had no scar lower down or around her umbilicus.   The appendix was carefully dissected. A window was made in the mesoappendix at the base of the appendix. The appendix was divided at its base using a standard endo-GIA stapler. Minimal appendiceal stump was left in place. The mesoappendix was taken with the harmonic energy device. The appendix was placed within an Endocatch specimen bag. There was no evidence of bleeding, leakage, or complication after division of the appendix.  Any remaining blood was suctioned out from the abdomen, hemostasis was confirmed. The endocatch bag was removed  via the 12 mm port, then the abdomen desufflated. The appendix was passed off the field as a specimen.   The the 12 mm and 10 mm port sites were closed with a 0 Vicryl suture. The trocar site skin wounds were closed using subcuticular 4-0 Monocryl  suture and dermabond. The patient was then awakened from general anesthesia, extubated, and taken to PACU for recovery.   Instrument, sponge, and needle counts were correct at the conclusion of the case.  Photos were given to the patient so she would know where her scar was located.   Algis Greenhouse, MD [facility] [address] [place], [place] [address] [phone]([id number])

## 2020-04-09 NOTE — ED Notes (Signed)
Pt taken to OR.

## 2020-04-09 NOTE — Anesthesia Preprocedure Evaluation (Signed)
Anesthesia Evaluation  Patient identified by MRN, date of birth, ID band Patient awake    Reviewed: Allergy & Precautions, H&P , NPO status , Patient's Chart, lab work & pertinent test results, reviewed documented beta blocker date and time   Airway Mallampati: II  TM Distance: >3 FB Neck ROM: full    Dental no notable dental hx.    Pulmonary sleep apnea ,    Pulmonary exam normal breath sounds clear to auscultation       Cardiovascular Exercise Tolerance: Good hypertension, negative cardio ROS   Rhythm:regular Rate:Normal     Neuro/Psych PSYCHIATRIC DISORDERS Depression negative neurological ROS     GI/Hepatic negative GI ROS, Neg liver ROS,   Endo/Other  diabetes, Type 2Hypothyroidism   Renal/GU negative Renal ROS  negative genitourinary   Musculoskeletal   Abdominal   Peds  Hematology negative hematology ROS (+)   Anesthesia Other Findings   Reproductive/Obstetrics negative OB ROS                             Anesthesia Physical Anesthesia Plan  ASA: III and emergent  Anesthesia Plan: General   Post-op Pain Management:    Induction:   PONV Risk Score and Plan:   Airway Management Planned:   Additional Equipment:   Intra-op Plan:   Post-operative Plan:   Informed Consent: I have reviewed the patients History and Physical, chart, labs and discussed the procedure including the risks, benefits and alternatives for the proposed anesthesia with the patient or authorized representative who has indicated his/her understanding and acceptance.     Dental Advisory Given  Plan Discussed with: CRNA  Anesthesia Plan Comments:         Anesthesia Quick Evaluation

## 2020-04-09 NOTE — ED Provider Notes (Signed)
[facility] EMERGENCY DEPARTMENT Provider Note   CSN: [id number] Arrival date & time: 04/09/20  0200     History Chief Complaint  Patient presents with  . Abdominal Pain    Melissa Pitts is a 60 y.o. female.  Patient presents to the emergency department for evaluation of several hours of epigastric discomfort associated with nausea and dry heaves.  No diarrhea.  No constipation.  She has not had a fever.  Patient reports she does have a history of a gallstone that was found incidentally, followed up with surgery but was not recommended to have cholecystectomy.        Past Medical History:  Diagnosis Date  . Allergy   . Breast mass   . Depression   . Diabetes mellitus without complication (HCC)    history of, had gastric bypass, no longer needs medications  . Gallstone   . History of kidney stones   . Hypertension    history of before gastric- by-pass  . Hypothyroidism   . Sleep apnea    wears CPAP- no longer needs since bypass  . Thyroid disease    hypothyroid    Patient Active Problem List   Diagnosis Date Noted  . E. coli sepsis (HCC) 04/20/2018  . Sepsis due to undetermined organism (HCC) 04/18/2018  . Acute pyelonephritis 04/18/2018  . Hypothyroidism (acquired) 04/18/2018  . Anastomotic ulcer 02/08/2013  . Hypomagnesemia 01/01/2013  . SIRS (systemic inflammatory response syndrome) (HCC) 12/24/2012  . 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 Bariatric surgery status 11/21/2012  . Clinical depression 06/25/2012  . Hypothyroidism 06/25/2012    Past Surgical History:  Procedure Laterality Date  . bypass leak repair  11/21/12  . EXTRACORPOREAL SHOCK WAVE LITHOTRIPSY Right 04/26/2018   Procedure: EXTRACORPOREAL SHOCK WAVE LITHOTRIPSY (ESWL);  Surgeon: Malen Gauze, MD;  Location: [facility];  Service: Urology;  Laterality: Right;  . EXTRACORPOREAL SHOCK WAVE LITHOTRIPSY Right 06/11/2018   Procedure: EXTRACORPOREAL SHOCK WAVE LITHOTRIPSY (ESWL);  Surgeon:  Malen Gauze, MD;  Location: [facility];  Service: Urology;  Laterality: Right;  . GASTRIC BYPASS  11/14/12  . HERNIA REPAIR  5/15  . VAGINAL HYSTERECTOMY  8/07     OB History    Gravida  2   Para  1   Term      Preterm      AB  1   Living  1     SAB  1   IAB      Ectopic      Multiple      Live Births              Family History  Problem Relation Age of Onset  . Hypertension Mother   . Breast cancer Mother   . Cancer Father   . Heart disease Father        bypass  . Diabetes Father   . Breast cancer Maternal Grandmother           . Diabetes Paternal Grandfather   . Congestive Heart Failure Maternal Aunt   . Colon cancer Neg Hx   . Esophageal cancer Neg Hx   . Rectal cancer Neg Hx   . Stomach cancer Neg Hx     Social History   Tobacco Use  . Smoking status: Never Smoker  . Smokeless tobacco: Never Used  Vaping Use  . Vaping Use: Never used  Substance Use Topics  . Alcohol use: No  . Drug use: No  Home Medications Prior to Admission medications   Medication Sig Start Date End Date Taking? Authorizing Provider  azelastine (ASTELIN) 0.1 % nasal spray Place 1 spray into both nostrils 2 (two) times daily. 01/17/19   [provider]  CALCIUM PO Take 1 tablet by mouth 2 (two) times daily.    [provider]  cholecalciferol (VITAMIN D3) 25 MCG (1000 UT) tablet Take 1,000 Units by mouth daily.    [provider]  Cyanocobalamin (B-12 SL) Place 1 tablet under the tongue daily.    [provider]  escitalopram (LEXAPRO) 20 MG tablet Take 20 mg by mouth at bedtime.  05/29/13   [provider]  levothyroxine (SYNTHROID, LEVOTHROID) 50 MCG tablet Take 1 tablet by mouth every evening.  03/07/11   [provider]  ondansetron (ZOFRAN ODT) 4 MG disintegrating tablet Take 1 tablet (4 mg total) by mouth every 8 (eight) hours as needed for nausea. 06/11/18   McKenzie, Mardene Celeste, MD  promethazine (PHENERGAN)  12.5 MG tablet TAKE (1) TABLET BY MOUTH EVERY EIGHT HOURS AS NEEDED FOR NAUSEA OR VOMITING. 10/25/19   Jerene Bears, MD    Allergies    Nsaids, Sulfamethoxazole, Penicillins, and Sulfur  Review of Systems   Review of Systems  Gastrointestinal: Positive for abdominal pain and nausea.  All other systems reviewed and are negative.   Physical Exam Updated Vital Signs BP (!) 149/81 (BP Location: Left Arm)   Pulse 88   Temp 98.6 F (37 C) (Oral)   Resp 18   Ht 5\' 3"  (1.6 m)   Wt 79.4 kg   LMP 04/04/2005   SpO2 97%   BMI 31.00 kg/m   Physical Exam Vitals and nursing note reviewed.  Constitutional:      General: She is not in acute distress.    Appearance: Normal appearance. She is well-developed and well-nourished.  HENT:     Head: Normocephalic and atraumatic.     Right Ear: Hearing normal.     Left Ear: Hearing normal.     Nose: Nose normal.     Mouth/Throat:     Mouth: Oropharynx is clear and moist and mucous membranes are normal.  Eyes:     Extraocular Movements: EOM normal.     Conjunctiva/sclera: Conjunctivae normal.     Pupils: Pupils are equal, round, and reactive to light.  Cardiovascular:     Rate and Rhythm: Regular rhythm.     Heart sounds: S1 normal and S2 normal. No murmur heard. No friction rub. No gallop.   Pulmonary:     Effort: Pulmonary effort is normal. No respiratory distress.     Breath sounds: Normal breath sounds.  Chest:     Chest wall: No tenderness.  Abdominal:     General: Bowel sounds are normal.     Palpations: Abdomen is soft. There is no hepatosplenomegaly.     Tenderness: There is abdominal tenderness in the right lower quadrant and epigastric area. There is no guarding or rebound. Negative signs include Murphy's sign and McBurney's sign.     Hernia: No hernia is present.  Musculoskeletal:        General: Normal range of motion.     Cervical back: Normal range of motion and neck supple.  Skin:    General: Skin is warm, dry and  intact.     Findings: No rash.     Nails: There is no cyanosis.  Neurological:     Mental Status: She is alert and  oriented to person, place, and time.     GCS: GCS eye subscore is 4. GCS verbal subscore is 5. GCS motor subscore is 6.     Cranial Nerves: No cranial nerve deficit.     Sensory: No sensory deficit.     Coordination: Coordination normal.     Deep Tendon Reflexes: Strength normal.  Psychiatric:        Mood and Affect: Mood and affect normal.        Speech: Speech normal.        Behavior: Behavior normal.        Thought Content: Thought content normal.     ED Results / Procedures / Treatments   Labs (all labs ordered are listed, but only abnormal results are displayed) Labs Reviewed  COMPREHENSIVE METABOLIC PANEL - Abnormal; Notable for the following components:      Result Value   Glucose, Bld 129 (*)    Calcium 8.8 (*)    Total Protein 6.4 (*)    AST 14 (*)    All other components within normal limits  CBC - Abnormal; Notable for the following components:   MCV 100.3 (*)    All other components within normal limits  RESP PANEL BY RT-PCR (FLU A&B, COVID) ARPGX2  LIPASE, BLOOD  URINALYSIS, ROUTINE W REFLEX MICROSCOPIC    EKG None  Radiology CT ABDOMEN PELVIS W CONTRAST  Result Date: 04/09/2020 CLINICAL DATA:  Acute abdominal pain EXAM: CT ABDOMEN AND PELVIS WITH CONTRAST TECHNIQUE: Multidetector CT imaging of the abdomen and pelvis was performed using the standard protocol following bolus administration of intravenous contrast. CONTRAST:  OMNIPAQUE IOHEXOL 300 MG/ML  SOLN COMPARISON:  04/18/2018 FINDINGS: Lower chest: Gastric bypass with mild distention of the lower esophagus. Hepatobiliary: No focal liver abnormality.Full gallbladder with stone near the neck. No pericholecystic edema. No bile duct dilatation. Pancreas: Unremarkable. Spleen: Unremarkable. Adrenals/Urinary Tract: Negative adrenals. No hydronephrosis or stone. Left renal sinus cyst.  Unremarkable bladder. Stomach/Bowel: Gastric bypass without noted related obstruction or visible inflammation. The appendix has dilated from before with appendicolith at the base and mesoappendiceal stranding that is convincing on coronal reformats. The appendix is in the right lower quadrant and extends medially from the cecum. Outer wall diameter is 11 mm. Vascular/Lymphatic: No acute vascular abnormality. No mass or adenopathy. Reproductive:Hysterectomy Other: No ascites or pneumoperitoneum. Musculoskeletal: No acute abnormalities. Focal L4-5 disc degeneration. 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 T11 hemangioma. IMPRESSION: 1. In the appropriate clinical setting, findings consistent with early acute appendicitis. An appendicolith is present. 2. Cholelithiasis. Electronically Signed   By: Marnee Spring M.D.   On: 04/09/2020 04:13    Procedures Procedures (including critical care time)  Medications Ordered in ED Medications  cefTRIAXone (ROCEPHIN) 2 g in sodium chloride 0.9 % 100 mL IVPB (has no administration in time range)    And  metroNIDAZOLE (FLAGYL) IVPB 500 mg (has no administration in time range)  morphine 4 MG/ML injection 4 mg (has no administration in time range)  ondansetron (ZOFRAN) injection 4 mg (has no administration in time range)  lactated ringers bolus 1,000 mL (has no administration in time range)  iohexol (OMNIPAQUE) 300 MG/ML solution 100 mL (100 mLs Intravenous Contrast Given 04/09/20 0342)    ED Course  I have reviewed the triage vital signs and the nursing notes.  Pertinent labs & imaging results that were available during my care of the patient were reviewed by me and considered in my medical decision making (see chart for  details).    MDM Rules/Calculators/A&P                          Patient presents with several hours of epigastric abdominal discomfort associated with nausea.  Patient reports dry heaves.  She does have a known gallstone but pain is not  right upper quadrant were radiating into the back.  She does not have right upper quadrant tenderness, doubt cholelithiasis or pain related to her known gallstone.  She has had previous gastric bypass surgery.  Patient underwent CT abdomen and pelvis to further evaluate. No evidence of obstruction or problems with her previous bypass. No evidence of cholecystitis. She does, however, have a dilated appendix with appendicolith and surrounding inflammation concerning for acute appendicitis. Treated with analgesia, fluids, antibiotics, consult surgery.  Final Clinical Impression(s) / ED Diagnoses Final diagnoses:  Acute appendicitis, unspecified acute appendicitis type    Rx / DC Orders ED Discharge Orders    None       Orpah Greek, MD 04/09/20 [phone]

## 2020-04-09 NOTE — ED Triage Notes (Signed)
Pt states she has been having upper abdominal pain with nausea x "several hours". Pt states she "thought it was gas so she took a GAS-X" without experiencing any relief.

## 2020-04-09 NOTE — Transfer of Care (Signed)
Immediate Anesthesia Transfer of Care Note  Patient: Melissa Pitts  Procedure(s) Performed: APPENDECTOMY LAPAROSCOPIC (N/A Abdomen)  Patient Location: PACU  Anesthesia Type:General  Level of Consciousness: awake, alert , oriented and patient cooperative  Airway & Oxygen Therapy: Patient Spontanous Breathing and Patient connected to nasal cannula oxygen  Post-op Assessment: Report given to RN, Post -op Vital signs reviewed and stable and Patient moving all extremities  Post vital signs: Reviewed and stable  Last Vitals:  Vitals Value Taken Time  BP 142/60 04/09/20 0919  Temp    Pulse 114 04/09/20 0920  Resp 20 04/09/20 0920  SpO2 90 % 04/09/20 0920  Vitals shown include unvalidated device data.  Last Pain:  Vitals:   04/09/20 0736  TempSrc: Oral  PainSc: 2       Patients Stated Pain Goal: 8 (04/09/20 0736)  Complications: No complications documented.

## 2020-04-09 NOTE — Anesthesia Postprocedure Evaluation (Signed)
Anesthesia Post Note  Patient: Melissa Pitts  Procedure(s) Performed: APPENDECTOMY LAPAROSCOPIC (N/A Abdomen)  Patient location during evaluation: PACU Anesthesia Type: General Level of consciousness: awake, oriented, awake and alert and patient cooperative Pain management: pain level controlled Vital Signs Assessment: post-procedure vital signs reviewed and stable Respiratory status: spontaneous breathing, respiratory function stable and nonlabored ventilation Cardiovascular status: blood pressure returned to baseline and stable Postop Assessment: no headache and no backache Anesthetic complications: no   No complications documented.   Last Vitals:  Vitals:   04/09/20 0600 04/09/20 0736  BP: 120/62 (!) 143/63  Pulse: 80 76  Resp:  14  Temp:  36.7 C  SpO2: 95% 94%    Last Pain:  Vitals:   04/09/20 0736  TempSrc: Oral  PainSc: 2                  Brynda Peon

## 2020-04-09 NOTE — Anesthesia Procedure Notes (Signed)
Procedure Name: Intubation Performed by: Brynda Peon, CRNA Pre-anesthesia Checklist: Patient identified, Emergency Drugs available, Suction available, Patient being monitored and Timeout performed Patient Re-evaluated:Patient Re-evaluated prior to induction Oxygen Delivery Method: Circle system utilized Preoxygenation: Pre-oxygenation with 100% oxygen Induction Type: IV induction Laryngoscope Size: Miller and 2 Grade View: Grade II Tube size: 7.0 mm Number of attempts: 1 Airway Equipment and Method: Stylet Placement Confirmation: ETT inserted through vocal cords under direct vision,  positive ETCO2,  CO2 detector and breath sounds checked- equal and bilateral Secured at: 21 cm Tube secured with: Tape Dental Injury: Teeth and Oropharynx as per pre-operative assessment

## 2020-04-10 ENCOUNTER — Encounter (HOSPITAL_COMMUNITY): Payer: Self-pay | Admitting: General Surgery

## 2020-04-10 LAB — SURGICAL PATHOLOGY

## 2020-04-23 ENCOUNTER — Telehealth (INDEPENDENT_AMBULATORY_CARE_PROVIDER_SITE_OTHER): Payer: Federal, State, Local not specified - PPO | Admitting: General Surgery

## 2020-04-23 DIAGNOSIS — K3580 Unspecified acute appendicitis: Secondary | ICD-10-CM

## 2020-04-23 NOTE — Progress Notes (Signed)
[facility]  I am calling the patient for post operative evaluation. This is not a billable encounter as it is under the global charges for the surgery.  The patient had a laparoscopic appendectomy on 04/09/2020. The patient reports that she is doing well. The are tolerating a diet, having good pain control, and having regular Bms.  The incisions are healing well but has some minor stinging on the left. The patient has no major concerns.   Pathology: FINAL MICROSCOPIC DIAGNOSIS:   A. APPENDIX, APPENDECTOMY:  - Acute appendicitis with serositis.   Will see the patient PRN.   Activity and diet as tolerated. Ok to lift but would start out slowly.   Algis Greenhouse, MD [facility] [address] [place], [place] [address] [phone] (office)

## 2020-04-29 ENCOUNTER — Ambulatory Visit (INDEPENDENT_AMBULATORY_CARE_PROVIDER_SITE_OTHER): Payer: Federal, State, Local not specified - PPO | Admitting: Urology

## 2020-04-29 ENCOUNTER — Other Ambulatory Visit: Payer: Self-pay

## 2020-04-29 ENCOUNTER — Encounter: Payer: Self-pay | Admitting: Urology

## 2020-04-29 VITALS — BP 119/71 | HR 89 | Temp 98.6°F | Ht 63.0 in | Wt 175.0 lb

## 2020-04-29 DIAGNOSIS — N200 Calculus of kidney: Secondary | ICD-10-CM

## 2020-04-29 LAB — URINALYSIS, ROUTINE W REFLEX MICROSCOPIC
Bilirubin, UA: NEGATIVE
Glucose, UA: NEGATIVE
Ketones, UA: NEGATIVE
Nitrite, UA: NEGATIVE
Protein,UA: NEGATIVE
Specific Gravity, UA: 1.015 (ref 1.005–1.030)
Urobilinogen, Ur: 1 mg/dL (ref 0.2–1.0)
pH, UA: 5.5 (ref 5.0–7.5)

## 2020-04-29 LAB — MICROSCOPIC EXAMINATION: Renal Epithel, UA: NONE SEEN /hpf

## 2020-04-29 NOTE — Progress Notes (Signed)
04/29/2020 3:36 PM   Melissa Pitts Feb 02, 1961 [id number]  Referring provider: Assunta Found, MD [address] [place],  [place] [address]  followup nephrolithiasis  HPI: Melissa Pitts is a 59yo here for followup for nephrolithiasis. No stone events since last visit. She drinks 64oz a day. She had a CT on 04/09/2020 which showed no calculi. No flank pain   PMH: Past Medical History:  Diagnosis Date  . Allergy   . Breast mass   . Depression   . Diabetes mellitus without complication (HCC)    history of, had gastric bypass, no longer needs medications  . Gallstone   . History of kidney stones   . Hypertension    history of before gastric- by-pass  . Hypothyroidism   . Sleep apnea    wears CPAP- no longer needs since bypass  . Thyroid disease    hypothyroid    Surgical History: Past Surgical History:  Procedure Laterality Date  . bypass leak repair  11/21/12  . EXTRACORPOREAL SHOCK WAVE LITHOTRIPSY Right 04/26/2018   Procedure: EXTRACORPOREAL SHOCK WAVE LITHOTRIPSY (ESWL);  Surgeon: Malen Gauze, MD;  Location: [facility];  Service: Urology;  Laterality: Right;  . EXTRACORPOREAL SHOCK WAVE LITHOTRIPSY Right 06/11/2018   Procedure: EXTRACORPOREAL SHOCK WAVE LITHOTRIPSY (ESWL);  Surgeon: Malen Gauze, MD;  Location: [facility];  Service: Urology;  Laterality: Right;  . GASTRIC BYPASS  11/14/12  . HERNIA REPAIR  5/15  . LAPAROSCOPIC APPENDECTOMY N/A 04/09/2020   Procedure: APPENDECTOMY LAPAROSCOPIC;  Surgeon: Lucretia Roers, MD;  Location: [facility];  Service: General;  Laterality: N/A;  . VAGINAL HYSTERECTOMY  8/07    Home Medications:  Allergies as of 04/29/2020      Reactions   Nsaids Other (See Comments)   Related to surgical history    Sulfamethoxazole Swelling   Elemental Sulfur Rash   Penicillins Rash   Did it involve swelling of the face/tongue/throat, SOB, or low BP? No Did it involve sudden or severe rash/hives, skin peeling, or any reaction on the inside of  your mouth or nose? Yes Did you need to seek medical attention at a hospital or doctor's office? No When did it last happen?Over 10 years If all above answers are "NO", may proceed with cephalosporin use.      Medication List       Accurate as of April 29, 2020  3:36 PM. If you have any questions, ask your nurse or doctor.        azelastine 0.1 % nasal spray Commonly known as: ASTELIN Place 1 spray into both nostrils 2 (two) times daily.   B-12 SL Place 1 tablet under the tongue daily.   benzonatate 200 MG capsule Commonly known as: TESSALON Take 200 mg by mouth 3 (three) times daily as needed.   buPROPion 150 MG 12 hr tablet Commonly known as: WELLBUTRIN SR Take by mouth.   CALCIUM PO Take 1 tablet by mouth 2 (two) times daily.   cholecalciferol 25 MCG (1000 UNIT) tablet Commonly known as: VITAMIN D3 Take 1,000 Units by mouth daily.   ergocalciferol 1.25 MG (50000 UT) capsule Commonly known as: VITAMIN D2 ergocalciferol (vitamin D2) 1,250 mcg (50,000 unit) capsule   escitalopram 20 MG tablet Commonly known as: LEXAPRO Take 20 mg by mouth at bedtime.   levothyroxine 50 MCG tablet Commonly known as: SYNTHROID Take 1 tablet by mouth every evening.   omeprazole 20 MG capsule Commonly known as: PRILOSEC omeprazole 20 mg capsule,delayed release  ondansetron 4 MG disintegrating tablet Commonly known as: Zofran ODT Take 1 tablet (4 mg total) by mouth every 8 (eight) hours as needed for nausea.   oxyCODONE 5 MG immediate release tablet Commonly known as: Roxicodone Take 1 tablet (5 mg total) by mouth every 4 (four) hours as needed for severe pain or breakthrough pain.   promethazine 12.5 MG tablet Commonly known as: PHENERGAN TAKE (1) TABLET BY MOUTH EVERY EIGHT HOURS AS NEEDED FOR NAUSEA OR VOMITING.       Allergies:  Allergies  Allergen Reactions  . Nsaids Other (See Comments)    Related to surgical history   . Sulfamethoxazole Swelling  .  Elemental Sulfur Rash  . Penicillins Rash    Did it involve swelling of the face/tongue/throat, SOB, or low BP? No Did it involve sudden or severe rash/hives, skin peeling, or any reaction on the inside of your mouth or nose? Yes Did you need to seek medical attention at a hospital or doctor's office? No When did it last happen?Over 10 years If all above answers are "NO", may proceed with cephalosporin use.     Family History: Family History  Problem Relation Age of Onset  . Hypertension Mother   . Breast cancer Mother   . Cancer Father   . Heart disease Father        bypass  . Diabetes Father   . Breast cancer Maternal Grandmother           . Diabetes Paternal Grandfather   . Congestive Heart Failure Maternal Aunt   . Colon cancer Neg Hx   . Esophageal cancer Neg Hx   . Rectal cancer Neg Hx   . Stomach cancer Neg Hx     Social History:  reports that she has never smoked. She has never used smokeless tobacco. She reports that she does not drink alcohol and does not use drugs.  ROS: All other review of systems were reviewed and are negative except what is noted above in HPI  Physical Exam: BP 119/71   Pulse 89   Temp 98.6 F (37 C) (Oral)   Ht 5\' 3"  (1.6 m)   Wt 175 lb (79.4 kg)   LMP 04/04/2005   BMI 31.00 kg/m   Constitutional:  Alert and oriented, No acute distress. HEENT: [place] AT, moist mucus membranes.  Trachea midline, no masses. Cardiovascular: No clubbing, cyanosis, or edema. Respiratory: Normal respiratory effort, no increased work of breathing. GI: Abdomen is soft, nontender, nondistended, no abdominal masses GU: No CVA tenderness.  Lymph: No cervical or inguinal lymphadenopathy. Skin: No rashes, bruises or suspicious lesions. Neurologic: Grossly intact, no focal deficits, moving all 4 extremities. Psychiatric: Normal mood and affect.  Laboratory Data: Lab Results  Component Value Date   WBC 10.1 04/09/2020   HGB 13.3 04/09/2020   HCT 39.8  04/09/2020   MCV 100.3 (H) 04/09/2020   PLT 181 04/09/2020    Lab Results  Component Value Date   CREATININE 0.59 04/09/2020    No results found for: PSA  No results found for: TESTOSTERONE  Lab Results  Component Value Date   HGBA1C  11/11/2008    5.9 (NOTE) The ADA recommends the following therapeutic goal for glycemic control related to Hgb A1c measurement: Goal of therapy: <6.5 Hgb A1c  Reference: American Diabetes Association: Clinical Practice Recommendations 2010, Diabetes Care, 2010, 33: (Suppl  1).    Urinalysis    Component Value Date/Time   COLORURINE YELLOW 04/09/2020 0507   APPEARANCEUR  CLEAR 04/09/2020 0507   LABSPEC >1.046 (H) 04/09/2020 0507   PHURINE 7.0 04/09/2020 0507   GLUCOSEU NEGATIVE 04/09/2020 0507   HGBUR NEGATIVE 04/09/2020 0507   BILIRUBINUR NEGATIVE 04/09/2020 0507   BILIRUBINUR small 05/04/2013 1443   KETONESUR NEGATIVE 04/09/2020 0507   PROTEINUR NEGATIVE 04/09/2020 0507   UROBILINOGEN 0.2 05/04/2013 1443   UROBILINOGEN 0.2 11/21/2012 0536   NITRITE NEGATIVE 04/09/2020 0507   LEUKOCYTESUR NEGATIVE 04/09/2020 0507    Lab Results  Component Value Date   MUCUS trace 05/04/2013   BACTERIA NONE SEEN 04/22/2018    Pertinent Imaging: CT 04/09/2020: Images reviewed and discussed with the patient Results for orders placed during the hospital encounter of 06/11/18  DG Abd 1 View  Narrative CLINICAL DATA:  Followup renal calculi.  Preop for lithotripsy.  EXAM: ABDOMEN - 1 VIEW  COMPARISON:  05/14/2010  FINDINGS: Stable large midpole right renal calculus measuring approximately 12 mm. No definite left-sided renal calculi or ureteral calculi or bladder calculi. The bowel gas pattern is unremarkable.  IMPRESSION: Stable right renal calculus. No definite left renal calculi or ureteral calculi.   Electronically Signed By: Rudie Meyer M.D. On: 06/11/2018 11:21  No results found for this or any previous visit.  No results found  for this or any previous visit.  No results found for this or any previous visit.  No results found for this or any previous visit.  No results found for this or any previous visit.  No results found for this or any previous visit.  Results for orders placed during the hospital encounter of 04/18/18  CT Renal Stone Study  Narrative CLINICAL DATA:  Onset right lower quadrant pain and hematuria yesterday.  EXAM: CT ABDOMEN AND PELVIS WITHOUT CONTRAST  TECHNIQUE: Multidetector CT imaging of the abdomen and pelvis was performed following the standard protocol without IV contrast.  COMPARISON:  CT abdomen and pelvis 08/26/2013.  FINDINGS: Lower chest: Lung bases clear.  No pleural or pericardial effusion.  Hepatobiliary: 2.6 cm stone is seen in the gallbladder, unchanged. No CT evidence of cholecystitis. The liver appears normal.  Pancreas: Unremarkable. No pancreatic ductal dilatation or surrounding inflammatory changes.  Spleen: Normal in size without focal abnormality.  Adrenals/Urinary Tract: The adrenal glands appear normal. Three stones are seen in the right kidney. The largest is in the lower pole and measures 1 cm in diameter. There is no right hydronephrosis and no ureteral stone is identified. No left renal stones are seen. There is some stranding about both kidneys, much greater on the right where there is also a small volume of perinephric fluid.  Stomach/Bowel: The patient is status post gastric bypass. Small hiatal hernia is seen. Stomach and small bowel are otherwise unremarkable. The colon appears normal. The appendix is not seen and may have been removed. No evidence of appendicitis.  Vascular/Lymphatic: No significant vascular findings are present. No enlarged abdominal or pelvic lymph nodes.  Reproductive: Status post hysterectomy. No adnexal masses.  Other: None.  Musculoskeletal: No acute or focal abnormality. Degenerative disc disease L4-5  noted.  IMPRESSION: Negative for hydronephrosis. The patient has 3 nonobstructing right renal stones. The largest measures 1 cm in diameter.  Bilateral perinephric stranding is much greater on the right. While nonspecific, this could be secondary to pyelonephritis.  2.6 cm gallstone without CT evidence of cholecystitis.  Small hiatal hernia.  Status post gastric bypass and hysterectomy.   Electronically Signed By: Drusilla Kanner M.D. On: 04/18/2018 14:02   Assessment & Plan:  1. Renal calculus -RTC 2 year with KUB - Urinalysis, Routine w reflex microscopic   No follow-ups on file.  Wilkie Aye, MD  [facility] [place]

## 2020-04-29 NOTE — Progress Notes (Signed)
Urological Symptom Review  Patient is experiencing the following symptoms: Kidney stones   Review of Systems  Gastrointestinal (upper)  : Negative for upper GI symptoms  Gastrointestinal (lower) : Negative for lower GI symptoms  Constitutional : Negative for symptoms  Skin: Negative for skin symptoms  Eyes: Negative for eye symptoms  Ear/Nose/Throat : Negative for Ear/Nose/Throat symptoms  Hematologic/Lymphatic: Negative for Hematologic/Lymphatic symptoms  Cardiovascular : Negative for cardiovascular symptoms  Respiratory : Negative for respiratory symptoms  Endocrine: Negative for endocrine symptoms  Musculoskeletal: Negative for musculoskeletal symptoms  Neurological: Negative for neurological symptoms  Psychologic: Negative for psychiatric symptoms  

## 2020-04-29 NOTE — Patient Instructions (Signed)

## 2020-05-29 ENCOUNTER — Ambulatory Visit: Payer: Federal, State, Local not specified - PPO

## 2020-08-03 DIAGNOSIS — M9903 Segmental and somatic dysfunction of lumbar region: Secondary | ICD-10-CM | POA: Diagnosis not present

## 2020-08-03 DIAGNOSIS — M62830 Muscle spasm of back: Secondary | ICD-10-CM | POA: Diagnosis not present

## 2020-08-03 DIAGNOSIS — M9901 Segmental and somatic dysfunction of cervical region: Secondary | ICD-10-CM | POA: Diagnosis not present

## 2020-08-03 DIAGNOSIS — M542 Cervicalgia: Secondary | ICD-10-CM | POA: Diagnosis not present

## 2020-08-06 DIAGNOSIS — Z9884 Bariatric surgery status: Secondary | ICD-10-CM | POA: Diagnosis not present

## 2020-08-06 DIAGNOSIS — Z1331 Encounter for screening for depression: Secondary | ICD-10-CM | POA: Diagnosis not present

## 2020-08-06 DIAGNOSIS — Z6831 Body mass index (BMI) 31.0-31.9, adult: Secondary | ICD-10-CM | POA: Diagnosis not present

## 2020-08-06 DIAGNOSIS — Z1389 Encounter for screening for other disorder: Secondary | ICD-10-CM | POA: Diagnosis not present

## 2020-08-06 DIAGNOSIS — Z0001 Encounter for general adult medical examination with abnormal findings: Secondary | ICD-10-CM | POA: Diagnosis not present

## 2020-08-06 DIAGNOSIS — E6609 Other obesity due to excess calories: Secondary | ICD-10-CM | POA: Diagnosis not present

## 2020-08-06 DIAGNOSIS — E039 Hypothyroidism, unspecified: Secondary | ICD-10-CM | POA: Diagnosis not present

## 2020-08-07 DIAGNOSIS — M62830 Muscle spasm of back: Secondary | ICD-10-CM | POA: Diagnosis not present

## 2020-08-07 DIAGNOSIS — M542 Cervicalgia: Secondary | ICD-10-CM | POA: Diagnosis not present

## 2020-08-07 DIAGNOSIS — M9903 Segmental and somatic dysfunction of lumbar region: Secondary | ICD-10-CM | POA: Diagnosis not present

## 2020-08-07 DIAGNOSIS — M9901 Segmental and somatic dysfunction of cervical region: Secondary | ICD-10-CM | POA: Diagnosis not present

## 2020-08-14 DIAGNOSIS — M9903 Segmental and somatic dysfunction of lumbar region: Secondary | ICD-10-CM | POA: Diagnosis not present

## 2020-08-14 DIAGNOSIS — M542 Cervicalgia: Secondary | ICD-10-CM | POA: Diagnosis not present

## 2020-08-14 DIAGNOSIS — M62830 Muscle spasm of back: Secondary | ICD-10-CM | POA: Diagnosis not present

## 2020-08-14 DIAGNOSIS — M9901 Segmental and somatic dysfunction of cervical region: Secondary | ICD-10-CM | POA: Diagnosis not present

## 2020-08-17 ENCOUNTER — Encounter (HOSPITAL_BASED_OUTPATIENT_CLINIC_OR_DEPARTMENT_OTHER): Payer: Self-pay | Admitting: Obstetrics & Gynecology

## 2020-08-17 ENCOUNTER — Ambulatory Visit (INDEPENDENT_AMBULATORY_CARE_PROVIDER_SITE_OTHER): Payer: Federal, State, Local not specified - PPO | Admitting: Obstetrics & Gynecology

## 2020-08-17 ENCOUNTER — Other Ambulatory Visit: Payer: Self-pay

## 2020-08-17 VITALS — BP 126/58 | HR 89 | Ht 62.5 in | Wt 180.0 lb

## 2020-08-17 DIAGNOSIS — Z803 Family history of malignant neoplasm of breast: Secondary | ICD-10-CM

## 2020-08-17 DIAGNOSIS — E039 Hypothyroidism, unspecified: Secondary | ICD-10-CM | POA: Diagnosis not present

## 2020-08-17 DIAGNOSIS — Z9189 Other specified personal risk factors, not elsewhere classified: Secondary | ICD-10-CM

## 2020-08-17 DIAGNOSIS — M8580 Other specified disorders of bone density and structure, unspecified site: Secondary | ICD-10-CM

## 2020-08-17 DIAGNOSIS — Z9884 Bariatric surgery status: Secondary | ICD-10-CM | POA: Diagnosis not present

## 2020-08-17 DIAGNOSIS — Z01419 Encounter for gynecological examination (general) (routine) without abnormal findings: Secondary | ICD-10-CM | POA: Diagnosis not present

## 2020-08-17 MED ORDER — PROMETHAZINE HCL 12.5 MG PO TABS: 12.5000 mg | ORAL_TABLET | Freq: Three times a day (TID) | ORAL | 0 refills | Status: DC | PRN

## 2020-08-17 NOTE — Progress Notes (Signed)
60 y.o. G56P0011 Married White or Caucasian female here for annual exam.  Doing well.  Denies vaginal bleeding.    Cleared from urologist from renal stone standpoint.    Has been seeing nutritionist as has gained a little weight since bariatric surgery.    Had appendicitis earlier this year.  Still had gallstones with imaging.  Not planning on having gall bladder removed unless having issues.  Patient's last menstrual period was 04/04/2005.          Sexually active: Yes.    The current method of family planning is tubal ligation.    Exercising: Yes.    walking, dancing and yoga Smoker:  no  Health Maintenance: Pap:  hysterectomy History of abnormal Pap:  no MMG:  11/2019 Colonoscopy:  2013, incomplete, follow up 10 years BMD:   11/06/2019, osteopenia TDaP:  03/05/2011 Shingrix:   discussed Hep C testing: discussed Screening Labs: done with PCP   reports that she has never smoked. She has never used smokeless tobacco. She reports that she does not drink alcohol and does not use drugs.  Past Medical History:  Diagnosis Date  . Allergy   . Breast mass   . Depression   . Gallstone   . History of diabetes mellitus    history of, had gastric bypass, no longer needs medications  . History of kidney stones   . Hypertension    history of before gastric- by-pass  . Hypothyroidism   . Sleep apnea    wears CPAP- no longer needs since bypass  . Thyroid disease    hypothyroid    Past Surgical History:  Procedure Laterality Date  . bypass leak repair  11/21/12  . EXTRACORPOREAL SHOCK WAVE LITHOTRIPSY Right 04/26/2018   Procedure: EXTRACORPOREAL SHOCK WAVE LITHOTRIPSY (ESWL);  Surgeon: Malen Gauze, MD;  Location: [facility];  Service: Urology;  Laterality: Right;  . EXTRACORPOREAL SHOCK WAVE LITHOTRIPSY Right 06/11/2018   Procedure: EXTRACORPOREAL SHOCK WAVE LITHOTRIPSY (ESWL);  Surgeon: Malen Gauze, MD;  Location: [facility];  Service: Urology;  Laterality: Right;  . GASTRIC  BYPASS  11/14/12  . HERNIA REPAIR  5/15  . LAPAROSCOPIC APPENDECTOMY N/A 04/09/2020   Procedure: APPENDECTOMY LAPAROSCOPIC;  Surgeon: Lucretia Roers, MD;  Location: [facility];  Service: General;  Laterality: N/A;  . VAGINAL HYSTERECTOMY  8/07    Current Outpatient Medications  Medication Sig Dispense Refill  . benzonatate (TESSALON) 200 MG capsule Take 200 mg by mouth 3 (three) times daily as needed.    Marland Kitchen CALCIUM PO Take 1 tablet by mouth 2 (two) times daily.    . cholecalciferol (VITAMIN D3) 25 MCG (1000 UT) tablet Take 1,000 Units by mouth daily.    . Cyanocobalamin (B-12 SL) Place 1 tablet under the tongue daily.    Marland Kitchen escitalopram (LEXAPRO) 20 MG tablet Take 20 mg by mouth at bedtime.     Marland Kitchen levothyroxine (SYNTHROID, LEVOTHROID) 50 MCG tablet Take 1 tablet by mouth every evening.     Marland Kitchen azelastine (ASTELIN) 0.1 % nasal spray Place 1 spray into both nostrils 2 (two) times daily. (Patient not taking: Reported on 08/17/2020)    . omeprazole (PRILOSEC) 20 MG capsule omeprazole 20 mg capsule,delayed release (Patient not taking: Reported on 08/17/2020)    . ondansetron (ZOFRAN ODT) 4 MG disintegrating tablet Take 1 tablet (4 mg total) by mouth every 8 (eight) hours as needed for nausea. (Patient not taking: Reported on 08/17/2020) 30 tablet 0  . promethazine (PHENERGAN) 12.5 MG tablet  Take 1 tablet (12.5 mg total) by mouth every 8 (eight) hours as needed for nausea or vomiting. 30 tablet 0   No current facility-administered medications for this visit.    Family History  Problem Relation Age of Onset  . Hypertension Mother   . Breast cancer Mother   . Cancer Father   . Heart disease Father        bypass  . Diabetes Father   . Breast cancer Maternal Grandmother           . Diabetes Paternal Grandfather   . Congestive Heart Failure Maternal Aunt   . Colon cancer Neg Hx   . Esophageal cancer Neg Hx   . Rectal cancer Neg Hx   . Stomach cancer Neg Hx     Review of Systems  All other systems  reviewed and are negative.   Exam:   BP (!) 126/58   Pulse 89   Ht 5' 2.5" (1.588 m)   Wt 180 lb (81.6 kg)   LMP 04/04/2005   BMI 32.40 kg/m   Height: 5' 2.5" (158.8 cm)  General appearance: alert, cooperative and appears stated age Head: Normocephalic, without obvious abnormality, atraumatic Neck: no adenopathy, supple, symmetrical, trachea midline and thyroid normal to inspection and palpation Lungs: clear to auscultation bilaterally Breasts: normal appearance, no masses or tenderness Heart: regular rate and rhythm Abdomen: soft, non-tender; bowel sounds normal; no masses,  no organomegaly Extremities: extremities normal, atraumatic, no cyanosis or edema Skin: Skin color, texture, turgor normal. No rashes or lesions Lymph nodes: Cervical, supraclavicular, and axillary nodes normal. No abnormal inguinal nodes palpated Neurologic: Grossly normal   Pelvic: External genitalia:  no lesions              Urethra:  normal appearing urethra with no masses, tenderness or lesions              Bartholins and Skenes: normal                 Vagina: normal appearing vagina with normal color and no discharge, no lesions              Cervix: absent              Pap taken: No. Bimanual Exam:  Uterus:  uterus absent              Adnexa: no mass, fullness, tenderness               Rectovaginal: Confirms               Anus:  normal sphincter tone, no lesions  Chaperone, Ina Homes, CMA, was present for exam.  Assessment/Plan: 1. Well woman exam with routine gynecological exam - pap smear not indicated due hysterectomy hx - MMG 11/2019 - Colonoscopy 2013.  Due next year. - BMD 11/2019 with osteopenia - lab work done with PCP - vaccines updated  2. Acquired hypothyroidism  3. 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 - promethazine (PHENERGAN) 12.5 MG tablet; Take 1 tablet (12.5 mg total) by mouth every 8 (eight) hours as needed for nausea  or vomiting.  Dispense: 30 tablet; Refill: 0  4. Family history of breast cancer - mother did not have genetic testing  5. Increased risk of breast cancer - yearly breast MRI recommended due to 22.5% lifetime risk of breast cancer with [employer] model.  High risk breast clinic discussed.  Pt not sure she wants  referral.  Genetic testing, risk reduction medication and increased screening all discussed.  6.  Osteopenia

## 2020-11-07 DIAGNOSIS — Z1231 Encounter for screening mammogram for malignant neoplasm of breast: Secondary | ICD-10-CM | POA: Diagnosis not present

## 2020-11-11 ENCOUNTER — Encounter (HOSPITAL_BASED_OUTPATIENT_CLINIC_OR_DEPARTMENT_OTHER): Payer: Self-pay | Admitting: Obstetrics & Gynecology

## 2020-12-08 DIAGNOSIS — G44219 Episodic tension-type headache, not intractable: Secondary | ICD-10-CM | POA: Diagnosis not present

## 2020-12-08 DIAGNOSIS — M9901 Segmental and somatic dysfunction of cervical region: Secondary | ICD-10-CM | POA: Diagnosis not present

## 2020-12-14 DIAGNOSIS — G44219 Episodic tension-type headache, not intractable: Secondary | ICD-10-CM | POA: Diagnosis not present

## 2020-12-14 DIAGNOSIS — M9901 Segmental and somatic dysfunction of cervical region: Secondary | ICD-10-CM | POA: Diagnosis not present

## 2020-12-23 DIAGNOSIS — E6609 Other obesity due to excess calories: Secondary | ICD-10-CM | POA: Diagnosis not present

## 2020-12-23 DIAGNOSIS — E039 Hypothyroidism, unspecified: Secondary | ICD-10-CM | POA: Diagnosis not present

## 2020-12-23 DIAGNOSIS — J069 Acute upper respiratory infection, unspecified: Secondary | ICD-10-CM | POA: Diagnosis not present

## 2020-12-23 DIAGNOSIS — F341 Dysthymic disorder: Secondary | ICD-10-CM | POA: Diagnosis not present

## 2021-01-05 DIAGNOSIS — J069 Acute upper respiratory infection, unspecified: Secondary | ICD-10-CM | POA: Diagnosis not present

## 2021-02-09 DIAGNOSIS — Z9884 Bariatric surgery status: Secondary | ICD-10-CM | POA: Diagnosis not present

## 2021-02-11 ENCOUNTER — Other Ambulatory Visit (HOSPITAL_BASED_OUTPATIENT_CLINIC_OR_DEPARTMENT_OTHER): Payer: Self-pay | Admitting: Obstetrics & Gynecology

## 2021-02-11 DIAGNOSIS — Z803 Family history of malignant neoplasm of breast: Secondary | ICD-10-CM

## 2021-02-11 DIAGNOSIS — Z9189 Other specified personal risk factors, not elsewhere classified: Secondary | ICD-10-CM

## 2021-02-15 DIAGNOSIS — Z9884 Bariatric surgery status: Secondary | ICD-10-CM | POA: Diagnosis not present

## 2021-02-16 ENCOUNTER — Encounter: Payer: Self-pay | Admitting: Allergy

## 2021-02-16 ENCOUNTER — Other Ambulatory Visit: Payer: Self-pay

## 2021-02-16 ENCOUNTER — Ambulatory Visit: Payer: Federal, State, Local not specified - PPO | Admitting: Allergy

## 2021-02-16 VITALS — BP 130/84 | HR 88 | Temp 97.7°F | Resp 18 | Ht 63.0 in | Wt 185.8 lb

## 2021-02-16 DIAGNOSIS — J329 Chronic sinusitis, unspecified: Secondary | ICD-10-CM

## 2021-02-16 DIAGNOSIS — J3089 Other allergic rhinitis: Secondary | ICD-10-CM

## 2021-02-16 NOTE — Patient Instructions (Addendum)
Today's skin testing showed: Positive to perennial mold mix.   Environmental allergies Start environmental control measures as below. Use over the counter antihistamines such as Zyrtec (cetirizine), Claritin (loratadine), Allegra (fexofenadine), or Xyzal (levocetirizine) daily as needed. May take twice a day during allergy flares. May switch antihistamines every few months. Use Nasacort (triamcinolone) nasal spray 1 spray per nostril twice a day as needed for nasal congestion.  Nasal saline spray (i.e., Simply Saline) or nasal saline lavage (i.e., NeilMed) is recommended as needed and prior to medicated nasal sprays.  Keep ENT appointment as scheduled. Keep track of infections/antibiotics use.  Follow up in 2 months or sooner if needed.    Mold Control Mold and fungi can grow on a variety of surfaces provided certain temperature and moisture conditions exist.  Outdoor molds grow on plants, decaying vegetation and soil. The major outdoor mold, Alternaria and Cladosporium, are found in very high numbers during hot and dry conditions. Generally, a late summer - fall peak is seen for common outdoor fungal spores. Rain will temporarily lower outdoor mold spore count, but counts rise rapidly when the rainy period ends. The most important indoor molds are Aspergillus and Penicillium. Dark, humid and poorly ventilated basements are ideal sites for mold growth. The next most common sites of mold growth are the bathroom and the kitchen. Outdoor (Seasonal) Mold Control Use air conditioning and keep windows closed. Avoid exposure to decaying vegetation. Avoid leaf raking. Avoid grain handling. Consider wearing a face mask if working in moldy areas.  Indoor (Perennial) Mold Control  Maintain humidity below 50%. Get rid of mold growth on hard surfaces with water, detergent and, if necessary, 5% bleach (do not mix with other cleaners). Then dry the area completely. If mold covers an area more than 10  square feet, consider hiring an indoor environmental professional. For clothing, washing with soap and water is best. If moldy items cannot be cleaned and dried, throw them away. Remove sources e.g. contaminated carpets. Repair and seal leaking roofs or pipes. Using dehumidifiers in damp basements may be helpful, but empty the water and clean units regularly to prevent mildew from forming. All rooms, especially basements, bathrooms and kitchens, require ventilation and cleaning to deter mold and mildew growth. Avoid carpeting on concrete or damp floors, and storing items in damp areas.

## 2021-02-16 NOTE — Progress Notes (Signed)
New Patient Note  RE: Melissa Pitts MRN: [id number] DOB: [birth year] Date of Office Visit: 02/16/2021  Consult requested by: Assunta Found, MD Primary care provider: Assunta Found, MD  Chief Complaint: Allergic Rhinitis  (Around 2017 mid September would have issues with her allergies always thought it was due to ragweed, but it seems like that may not been the cause. She has been on prednisone, zpacks and allergy mediations with no relief. Now she is having issues with headaches.  ) and Sinus Problem (Sinus pressure, headaches, some drainage and cough at night. Heavy eye lids )  History of Present Illness: I had the pleasure of seeing Melissa Pitts for initial evaluation at the [facility] on 02/17/2021. She is a 60 y.o. female, who is self-referred here for the evaluation of allergic rhinitis.  She reports symptoms of sinus pressure, headaches, sinus infections, PND. Symptoms have been going on for 5-6 years. The symptoms are present mainly in the fall. Other triggers include exposure to unknown. Anosmia: no. Headache: yes. She has used zyrtec, saline spray with some improvement in symptoms. Sinus infections: yes. Previous work up includes: patient had skin testing as a teen - positive to tobacco per patient report. She was on AIT for 3 years with some benefit. Previous ENT evaluation: no but scheduled to go in December, no prior sinus surgery.  Previous sinus imaging: no. History of nasal polyps: no. Last eye exam: a few weeks ago. History of reflux: denies.  Assessment and Plan: Melissa Pitts is a 60 y.o. female with: Other allergic rhinitis Rhinitis symptoms with headaches and sinus infections for the last 5 to 6 years mainly in the fall.  Tried Zyrtec and saline spray with some benefit.  Skin testing as a teen and was on AIT for 3 years with some benefit.  No recent ENT evaluation and no prior sinus surgery. Today's skin testing showed: Positive to perennial mold mix.   Discussed with patient that I am not sure how much of the above allergens are contributing to her symptoms currently. Pitts environmental control measures as below. Use over the counter antihistamines such as Zyrtec (cetirizine), Claritin (loratadine), Allegra (fexofenadine), or Xyzal (levocetirizine) daily as needed. May take twice a day during allergy flares. May switch antihistamines every few months. Use Nasacort (triamcinolone) nasal spray 1 spray per nostril twice a day as needed for nasal congestion.  Nasal saline spray (i.e., Simply Saline) or nasal saline lavage (i.e., NeilMed) is recommended as needed and prior to medicated nasal sprays. Keep ENT appointment as scheduled.  Frequent episodes of sinusitis Keep ENT appointment as scheduled. Keep track of infections/antibiotics use.  Return in about 2 months (around 04/18/2021).  No orders of the defined types were placed in this encounter.  Lab Orders  No laboratory test(s) ordered today    Other allergy screening: Asthma: no Food allergy:  lactose intolerance Medication allergy: yes Hymenoptera allergy: no Urticaria: no Eczema:no History of recurrent infections suggestive of immunodeficency: no  Diagnostics: Skin Testing: Environmental allergy panel. Positive to perennial mold mix. Results discussed with patient/family.  Airborne Adult Perc - 02/16/21 1406     Time Antigen Placed 1406    Allergen Manufacturer Waynette Buttery    Location Back    Number of Test 59    1. Control-Buffer 50% Glycerol Negative    2. Control-Histamine 1 mg/ml 2+    3. Albumin saline Negative    4. Bahia Negative    5. French Southern Territories Negative  6. Johnson Negative    7. Kentucky Blue Negative    8. Meadow Fescue Negative    9. Perennial Rye Negative    10. Sweet Vernal Negative    11. Timothy Negative    12. Cocklebur Negative    13. Burweed Marshelder Negative    14. Ragweed, short Negative    15. Ragweed, Giant Negative    16. Plantain,   English Negative    17. Lamb's Quarters Negative    18. Sheep Sorrell Negative    19. Rough Pigweed Negative    20. Marsh Elder, Rough Negative    21. Mugwort, Common Negative    22. Ash mix Negative    23. Birch mix Negative    24. Beech American Negative    25. Box, Elder Negative    26. Cedar, red Negative    27. Cottonwood, Guinea-Bissau Negative    28. Elm mix Negative    29. Hickory Negative    30. Maple mix Negative    31. Oak, Guinea-Bissau mix Negative    32. Pecan Pollen Negative    33. Pine mix Negative    34. Sycamore Eastern Negative    35. Walnut, Black Pollen Negative    36. Alternaria alternata Negative    37. Cladosporium Herbarum Negative    38. Aspergillus mix Negative    39. Penicillium mix Negative    40. Bipolaris sorokiniana (Helminthosporium) Negative    41. Drechslera spicifera (Curvularia) Negative    42. Mucor plumbeus Negative    43. Fusarium moniliforme Negative    44. Aureobasidium pullulans (pullulara) Negative    45. Rhizopus oryzae Negative    46. Botrytis cinera Negative    47. Epicoccum nigrum Negative    48. Phoma betae Negative    49. Candida Albicans Negative    50. Trichophyton mentagrophytes Negative    51. Mite, D Farinae  5,000 AU/ml Negative    52. Mite, D Pteronyssinus  5,000 AU/ml Negative    53. Cat Hair 10,000 BAU/ml Negative    54.  Dog Epithelia Negative    55. Mixed Feathers Negative    56. Horse Epithelia Negative    57. Cockroach, German Negative    58. Mouse Negative    59. Tobacco Leaf Negative             Intradermal - 02/16/21 1523     Time Antigen Placed 0230    Allergen Manufacturer Waynette Buttery    Location Arm    Number of Test 15    Intradermal Select    Control Negative    French Southern Territories Negative    Johnson Negative    7 Grass Negative    Ragweed mix Negative    Weed mix Negative    Tree mix Negative    Mold 1 Negative    Mold 2 2+    Mold 3 Negative    Mold 4 --   +/-   Cat Negative    Dog Negative    Cockroach  Negative    Mite mix Negative             Past Medical History: Patient Active Problem List   Diagnosis Date Noted   Other allergic rhinitis 02/17/2021   Frequent episodes of sinusitis 02/17/2021   Osteopenia 08/17/2020   Increased risk of breast cancer 08/17/2020   Family history of breast cancer 08/17/2020   Hypothyroidism (acquired) 04/18/2018   Anastomotic ulcer 02/08/2013   Hypomagnesemia 01/01/2013   SIRS (systemic inflammatory response  syndrome) (HCC) 12/24/2012   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   Bariatric surgery status 11/21/2012   Clinical depression 06/25/2012   Past Medical History:  Diagnosis Date   Allergy    Breast mass    Depression    Gallstone    History of diabetes mellitus    history of, had gastric bypass, no longer needs medications   History of kidney stones    Hypertension    history of before gastric- by-pass   Hypothyroidism    Sleep apnea    wears CPAP- no longer needs since bypass   Thyroid disease    hypothyroid   Past Surgical History: Past Surgical History:  Procedure Laterality Date   bypass leak repair  11/21/12   EXTRACORPOREAL SHOCK WAVE LITHOTRIPSY Right 04/26/2018   Procedure: EXTRACORPOREAL SHOCK WAVE LITHOTRIPSY (ESWL);  Surgeon: Malen Gauze, MD;  Location: [facility];  Service: Urology;  Laterality: Right;   EXTRACORPOREAL SHOCK WAVE LITHOTRIPSY Right 06/11/2018   Procedure: EXTRACORPOREAL SHOCK WAVE LITHOTRIPSY (ESWL);  Surgeon: Malen Gauze, MD;  Location: [facility];  Service: Urology;  Laterality: Right;   GASTRIC BYPASS  11/14/12   HERNIA REPAIR  5/15   LAPAROSCOPIC APPENDECTOMY N/A 04/09/2020   Procedure: APPENDECTOMY LAPAROSCOPIC;  Surgeon: Lucretia Roers, MD;  Location: [facility];  Service: General;  Laterality: N/A;   VAGINAL HYSTERECTOMY  8/07   Medication List:  Current Outpatient Medications  Medication Sig Dispense Refill   azelastine (ASTELIN) 0.1 % nasal spray Place 1 spray into both nostrils 2  (two) times daily.     benzonatate (TESSALON) 200 MG capsule Take 200 mg by mouth 3 (three) times daily as needed.     CALCIUM PO Take 1 tablet by mouth 2 (two) times daily.     cholecalciferol (VITAMIN D3) 25 MCG (1000 UT) tablet Take 1,000 Units by mouth daily.     Cyanocobalamin (B-12 SL) Place 1 tablet under the tongue daily.     escitalopram (LEXAPRO) 20 MG tablet Take 20 mg by mouth at bedtime.      levothyroxine (SYNTHROID, LEVOTHROID) 50 MCG tablet Take 1 tablet by mouth every evening.      omeprazole (PRILOSEC) 20 MG capsule      ondansetron (ZOFRAN ODT) 4 MG disintegrating tablet Take 1 tablet (4 mg total) by mouth every 8 (eight) hours as needed for nausea. 30 tablet 0   promethazine (PHENERGAN) 12.5 MG tablet Take 1 tablet (12.5 mg total) by mouth every 8 (eight) hours as needed for nausea or vomiting. 30 tablet 0   No current facility-administered medications for this visit.   Allergies: Allergies  Allergen Reactions   Nsaids Other (See Comments)    Related to surgical history    Sulfamethoxazole Swelling   Elemental Sulfur Rash   Penicillins Rash    Did it involve swelling of the face/tongue/throat, SOB, or low BP? No Did it involve sudden or severe rash/hives, skin peeling, or any reaction on the inside of your mouth or nose? Yes Did you need to seek medical attention at a hospital or doctor's office? No When did it last happen? Over 10 years      If all above answers are "NO", may proceed with cephalosporin use.    Social History: Social History   Socioeconomic History   Marital status: Married    Spouse name: Not on file   Number of children: Not on file   Years of education: Not on file   Highest education level: Not  on file  Occupational History   Not on file  Tobacco Use   Smoking status: Never   Smokeless tobacco: Never  Vaping Use   Vaping Use: Never used  Substance and Sexual Activity   Alcohol use: No   Drug use: No   Sexual activity: Yes     Partners: Male    Birth control/protection: Surgical, None    Comment: [facility]  Other Topics Concern   Not on file  Social History Narrative   Not on file   Social Determinants of Health   Financial Resource Strain: Not on file  Food Insecurity: Not on file  Transportation Needs: Not on file  Physical Activity: Not on file  Stress: Not on file  Social Connections: Not on file   Lives in a 62 year old house. Smoking: denies Occupation: retired  Landscape architect HistorySurveyor, minerals in the house: no Engineer, civil (consulting) in the family room: no Carpet in the bedroom: no Heating: electric Cooling: central Pet: yes 1 dog  x 1 yr  Family History: Family History  Problem Relation Age of Onset   Hypertension Mother    Breast cancer Mother    Cancer Father    Heart disease Father        bypass   Diabetes Father    Breast cancer Maternal Grandmother            Diabetes Paternal Grandfather    Congestive Heart Failure Maternal Aunt    Colon cancer Neg Hx    Esophageal cancer Neg Hx    Rectal cancer Neg Hx    Stomach cancer Neg Hx    Problem                               Relation Asthma                                   no Eczema                                no Food allergy                          no Allergic rhino conjunctivitis     no  Review of Systems  Constitutional:  Negative for appetite change, chills, fever and unexpected weight change.  HENT:  Positive for congestion and sinus pressure. Negative for rhinorrhea.   Eyes:  Negative for itching.  Respiratory:  Negative for cough, chest tightness, shortness of breath and wheezing.   Cardiovascular:  Negative for chest pain.  Gastrointestinal:  Negative for abdominal pain.  Genitourinary:  Negative for difficulty urinating.  Skin:  Negative for rash.  Allergic/Immunologic: Positive for environmental allergies.  Neurological:  Positive for headaches.   Objective: BP 130/84   Pulse 88   Temp 97.7 F (36.5 C)   Resp 18    Ht 5\' 3"  (1.6 m)   Wt 185 lb 12.8 oz (84.3 kg)   LMP 04/04/2005   SpO2 97%   BMI 32.91 kg/m  Body mass index is 32.91 kg/m. Physical Exam Vitals and nursing note reviewed.  Constitutional:      Appearance: Normal appearance. She is well-developed.  HENT:     Head: Normocephalic and atraumatic.     Right Ear: Tympanic  membrane and external ear normal.     Left Ear: Tympanic membrane and external ear normal.     Nose: Nose normal.     Mouth/Throat:     Mouth: Mucous membranes are moist.     Pharynx: Oropharynx is clear.  Eyes:     Conjunctiva/sclera: Conjunctivae normal.  Cardiovascular:     Rate and Rhythm: Normal rate and regular rhythm.     Heart sounds: Normal heart sounds. No murmur heard.   No friction rub. No gallop.  Pulmonary:     Effort: Pulmonary effort is normal.     Breath sounds: Normal breath sounds. No wheezing, rhonchi or rales.  Musculoskeletal:     Cervical back: Neck supple.  Skin:    General: Skin is warm.     Findings: No rash.  Neurological:     Mental Status: She is alert and oriented to person, place, and time.  Psychiatric:        Behavior: Behavior normal.  The plan was reviewed with the patient/family, and all questions/concerned were addressed.  It was my pleasure to see Melissa Pitts today and participate in her care. Please feel free to contact me with any questions or concerns.  Sincerely,  Wyline Mood, DO Allergy & Immunology  [facility] office: [phone] [facility] office: [phone]

## 2021-02-17 DIAGNOSIS — J3089 Other allergic rhinitis: Secondary | ICD-10-CM | POA: Insufficient documentation

## 2021-02-17 DIAGNOSIS — J329 Chronic sinusitis, unspecified: Secondary | ICD-10-CM | POA: Insufficient documentation

## 2021-02-17 NOTE — Assessment & Plan Note (Signed)
Rhinitis symptoms with headaches and sinus infections for the last 5 to 6 years mainly in the fall.  Tried Zyrtec and saline spray with some benefit.  Skin testing as a teen and was on AIT for 3 years with some benefit.  No recent ENT evaluation and no prior sinus surgery.  Today's skin testing showed: Positive to perennial mold mix.   Discussed with patient that I am not sure how much of the above allergens are contributing to her symptoms currently.  Start environmental control measures as below.  Use over the counter antihistamines such as Zyrtec (cetirizine), Claritin (loratadine), Allegra (fexofenadine), or Xyzal (levocetirizine) daily as needed. May take twice a day during allergy flares. May switch antihistamines every few months.  Use Nasacort (triamcinolone) nasal spray 1 spray per nostril twice a day as needed for nasal congestion.   Nasal saline spray (i.e., Simply Saline) or nasal saline lavage (i.e., NeilMed) is recommended as needed and prior to medicated nasal sprays.  Keep ENT appointment as scheduled.

## 2021-02-17 NOTE — Assessment & Plan Note (Signed)
   Keep ENT appointment as scheduled.  Keep track of infections/antibiotics use.

## 2021-02-18 DIAGNOSIS — Z713 Dietary counseling and surveillance: Secondary | ICD-10-CM | POA: Diagnosis not present

## 2021-03-08 DIAGNOSIS — J343 Hypertrophy of nasal turbinates: Secondary | ICD-10-CM | POA: Diagnosis not present

## 2021-03-08 DIAGNOSIS — J310 Chronic rhinitis: Secondary | ICD-10-CM | POA: Diagnosis not present

## 2021-03-24 DIAGNOSIS — J343 Hypertrophy of nasal turbinates: Secondary | ICD-10-CM | POA: Diagnosis not present

## 2021-03-24 DIAGNOSIS — J3489 Other specified disorders of nose and nasal sinuses: Secondary | ICD-10-CM | POA: Diagnosis not present

## 2021-03-24 DIAGNOSIS — R519 Headache, unspecified: Secondary | ICD-10-CM | POA: Diagnosis not present

## 2021-03-30 DIAGNOSIS — Z9884 Bariatric surgery status: Secondary | ICD-10-CM | POA: Diagnosis not present

## 2021-04-09 DIAGNOSIS — Z049 Encounter for examination and observation for unspecified reason: Secondary | ICD-10-CM | POA: Diagnosis not present

## 2021-04-09 DIAGNOSIS — Z79899 Other long term (current) drug therapy: Secondary | ICD-10-CM | POA: Diagnosis not present

## 2021-04-09 DIAGNOSIS — G43909 Migraine, unspecified, not intractable, without status migrainosus: Secondary | ICD-10-CM | POA: Insufficient documentation

## 2021-04-09 DIAGNOSIS — G43719 Chronic migraine without aura, intractable, without status migrainosus: Secondary | ICD-10-CM | POA: Diagnosis not present

## 2021-04-13 DIAGNOSIS — M791 Myalgia, unspecified site: Secondary | ICD-10-CM | POA: Diagnosis not present

## 2021-04-13 DIAGNOSIS — G518 Other disorders of facial nerve: Secondary | ICD-10-CM | POA: Diagnosis not present

## 2021-04-13 DIAGNOSIS — G43719 Chronic migraine without aura, intractable, without status migrainosus: Secondary | ICD-10-CM | POA: Diagnosis not present

## 2021-04-13 DIAGNOSIS — M542 Cervicalgia: Secondary | ICD-10-CM | POA: Diagnosis not present

## 2021-04-23 DIAGNOSIS — Z9884 Bariatric surgery status: Secondary | ICD-10-CM | POA: Diagnosis not present

## 2021-04-23 DIAGNOSIS — Z713 Dietary counseling and surveillance: Secondary | ICD-10-CM | POA: Diagnosis not present

## 2021-04-27 ENCOUNTER — Ambulatory Visit: Payer: Federal, State, Local not specified - PPO | Admitting: Allergy

## 2021-04-27 DIAGNOSIS — M9901 Segmental and somatic dysfunction of cervical region: Secondary | ICD-10-CM | POA: Diagnosis not present

## 2021-04-27 DIAGNOSIS — G44219 Episodic tension-type headache, not intractable: Secondary | ICD-10-CM | POA: Diagnosis not present

## 2021-04-29 DIAGNOSIS — M542 Cervicalgia: Secondary | ICD-10-CM | POA: Diagnosis not present

## 2021-04-29 DIAGNOSIS — M791 Myalgia, unspecified site: Secondary | ICD-10-CM | POA: Diagnosis not present

## 2021-04-29 DIAGNOSIS — G43719 Chronic migraine without aura, intractable, without status migrainosus: Secondary | ICD-10-CM | POA: Diagnosis not present

## 2021-04-29 DIAGNOSIS — G518 Other disorders of facial nerve: Secondary | ICD-10-CM | POA: Diagnosis not present

## 2021-05-11 ENCOUNTER — Other Ambulatory Visit: Payer: Federal, State, Local not specified - PPO

## 2021-05-13 DIAGNOSIS — M542 Cervicalgia: Secondary | ICD-10-CM | POA: Diagnosis not present

## 2021-05-13 DIAGNOSIS — G43719 Chronic migraine without aura, intractable, without status migrainosus: Secondary | ICD-10-CM | POA: Diagnosis not present

## 2021-05-13 DIAGNOSIS — M791 Myalgia, unspecified site: Secondary | ICD-10-CM | POA: Diagnosis not present

## 2021-05-13 DIAGNOSIS — G518 Other disorders of facial nerve: Secondary | ICD-10-CM | POA: Diagnosis not present

## 2021-05-14 ENCOUNTER — Ambulatory Visit
Admission: RE | Admit: 2021-05-14 | Discharge: 2021-05-14 | Disposition: A | Discharge status: Home / Self Care | Payer: Federal, State, Local not specified - PPO | Source: Ambulatory Visit | Attending: Obstetrics & Gynecology | Admitting: Obstetrics & Gynecology

## 2021-05-14 DIAGNOSIS — Z803 Family history of malignant neoplasm of breast: Secondary | ICD-10-CM

## 2021-05-14 DIAGNOSIS — Z9189 Other specified personal risk factors, not elsewhere classified: Secondary | ICD-10-CM

## 2021-05-14 MED ORDER — GADOBUTROL 1 MMOL/ML IV SOLN
8.0000 mL | Freq: Once | INTRAVENOUS | Status: AC | PRN
  Administered 2021-05-14: 8 mL via INTRAVENOUS

## 2021-05-27 DIAGNOSIS — M542 Cervicalgia: Secondary | ICD-10-CM | POA: Diagnosis not present

## 2021-05-27 DIAGNOSIS — M791 Myalgia, unspecified site: Secondary | ICD-10-CM | POA: Diagnosis not present

## 2021-05-27 DIAGNOSIS — G43719 Chronic migraine without aura, intractable, without status migrainosus: Secondary | ICD-10-CM | POA: Diagnosis not present

## 2021-05-27 DIAGNOSIS — G518 Other disorders of facial nerve: Secondary | ICD-10-CM | POA: Diagnosis not present

## 2021-06-10 DIAGNOSIS — G43719 Chronic migraine without aura, intractable, without status migrainosus: Secondary | ICD-10-CM | POA: Diagnosis not present

## 2021-06-10 DIAGNOSIS — M542 Cervicalgia: Secondary | ICD-10-CM | POA: Diagnosis not present

## 2021-06-10 DIAGNOSIS — M791 Myalgia, unspecified site: Secondary | ICD-10-CM | POA: Diagnosis not present

## 2021-06-10 DIAGNOSIS — G518 Other disorders of facial nerve: Secondary | ICD-10-CM | POA: Diagnosis not present

## 2021-06-21 DIAGNOSIS — Z9884 Bariatric surgery status: Secondary | ICD-10-CM | POA: Diagnosis not present

## 2021-06-28 DIAGNOSIS — G518 Other disorders of facial nerve: Secondary | ICD-10-CM | POA: Diagnosis not present

## 2021-06-28 DIAGNOSIS — M542 Cervicalgia: Secondary | ICD-10-CM | POA: Diagnosis not present

## 2021-06-28 DIAGNOSIS — M791 Myalgia, unspecified site: Secondary | ICD-10-CM | POA: Diagnosis not present

## 2021-06-28 DIAGNOSIS — G43719 Chronic migraine without aura, intractable, without status migrainosus: Secondary | ICD-10-CM | POA: Diagnosis not present

## 2021-06-29 DIAGNOSIS — Z9884 Bariatric surgery status: Secondary | ICD-10-CM | POA: Diagnosis not present

## 2021-07-29 DIAGNOSIS — Z9884 Bariatric surgery status: Secondary | ICD-10-CM | POA: Diagnosis not present

## 2021-07-29 DIAGNOSIS — Z713 Dietary counseling and surveillance: Secondary | ICD-10-CM | POA: Diagnosis not present

## 2021-08-09 DIAGNOSIS — M791 Myalgia, unspecified site: Secondary | ICD-10-CM | POA: Diagnosis not present

## 2021-08-09 DIAGNOSIS — M542 Cervicalgia: Secondary | ICD-10-CM | POA: Diagnosis not present

## 2021-08-09 DIAGNOSIS — G43719 Chronic migraine without aura, intractable, without status migrainosus: Secondary | ICD-10-CM | POA: Diagnosis not present

## 2021-08-09 DIAGNOSIS — G518 Other disorders of facial nerve: Secondary | ICD-10-CM | POA: Diagnosis not present

## 2021-08-20 ENCOUNTER — Ambulatory Visit (INDEPENDENT_AMBULATORY_CARE_PROVIDER_SITE_OTHER): Payer: Federal, State, Local not specified - PPO | Admitting: Obstetrics & Gynecology

## 2021-08-20 ENCOUNTER — Encounter (HOSPITAL_BASED_OUTPATIENT_CLINIC_OR_DEPARTMENT_OTHER): Payer: Self-pay | Admitting: Obstetrics & Gynecology

## 2021-08-20 VITALS — BP 120/70 | HR 74 | Ht 63.0 in | Wt 155.8 lb

## 2021-08-20 DIAGNOSIS — Z23 Encounter for immunization: Secondary | ICD-10-CM | POA: Diagnosis not present

## 2021-08-20 DIAGNOSIS — Z9884 Bariatric surgery status: Secondary | ICD-10-CM | POA: Diagnosis not present

## 2021-08-20 DIAGNOSIS — Z9189 Other specified personal risk factors, not elsewhere classified: Secondary | ICD-10-CM | POA: Diagnosis not present

## 2021-08-20 DIAGNOSIS — Z1211 Encounter for screening for malignant neoplasm of colon: Secondary | ICD-10-CM

## 2021-08-20 DIAGNOSIS — Z803 Family history of malignant neoplasm of breast: Secondary | ICD-10-CM

## 2021-08-20 DIAGNOSIS — Z01419 Encounter for gynecological examination (general) (routine) without abnormal findings: Secondary | ICD-10-CM

## 2021-08-20 DIAGNOSIS — M8580 Other specified disorders of bone density and structure, unspecified site: Secondary | ICD-10-CM

## 2021-08-20 NOTE — Progress Notes (Signed)
61 y.o. G22P0011 Married White or Caucasian female here for annual exam.  Doing well.  Denies vaignal bleeding.  Has been working on weight loss again.  Felt really uncomfortable last year due to some weight gain so started working with the nutritionist at [employer].     Had significant issues with headache last year.  Saw allergies, two ENTs and neurologist at [facility].  Dr. Neale Burly diagnosed her with migraines.    Patient's last menstrual period was 04/04/2005.          Sexually active: Yes.    The current method of family planning is status post hysterectomy.    Exercising: Yes.    Smoker:  no  Health Maintenance: Pap:  not indicated History of abnormal Pap:  no MMG:  11/07/2020 Negative Colonoscopy:  04/20/2011 BMD:   11/2019, osteopenia Screening Labs: does with PCP   reports that she has never smoked. She has never used smokeless tobacco. She reports that she does not drink alcohol and does not use drugs.  Past Medical History:  Diagnosis Date   Allergy    Breast mass    Depression    Gallstone    History of diabetes mellitus    history of, had gastric bypass, no longer needs medications   History of kidney stones    Hypertension    history of before gastric- by-pass   Hypothyroidism    Sleep apnea    wears CPAP- no longer needs since bypass   Thyroid disease    hypothyroid    Past Surgical History:  Procedure Laterality Date   bypass leak repair  11/21/12   EXTRACORPOREAL SHOCK WAVE LITHOTRIPSY Right 04/26/2018   Procedure: EXTRACORPOREAL SHOCK WAVE LITHOTRIPSY (ESWL);  Surgeon: Malen Gauze, MD;  Location: [facility];  Service: Urology;  Laterality: Right;   EXTRACORPOREAL SHOCK WAVE LITHOTRIPSY Right 06/11/2018   Procedure: EXTRACORPOREAL SHOCK WAVE LITHOTRIPSY (ESWL);  Surgeon: Malen Gauze, MD;  Location: [facility];  Service: Urology;  Laterality: Right;   GASTRIC BYPASS  11/14/12   HERNIA REPAIR  5/15   LAPAROSCOPIC APPENDECTOMY N/A 04/09/2020    Procedure: APPENDECTOMY LAPAROSCOPIC;  Surgeon: Lucretia Roers, MD;  Location: [facility];  Service: General;  Laterality: N/A;   VAGINAL HYSTERECTOMY  8/07    Current Outpatient Medications  Medication Sig Dispense Refill   azelastine (ASTELIN) 0.1 % nasal spray Place 1 spray into both nostrils 2 (two) times daily.     benzonatate (TESSALON) 200 MG capsule Take 200 mg by mouth 3 (three) times daily as needed.     CALCIUM PO Take 1 tablet by mouth 2 (two) times daily.     cholecalciferol (VITAMIN D3) 25 MCG (1000 UT) tablet Take 1,000 Units by mouth daily.     Cyanocobalamin (B-12 SL) Place 1 tablet under the tongue daily.     escitalopram (LEXAPRO) 20 MG tablet Take 20 mg by mouth at bedtime.      levothyroxine (SYNTHROID, LEVOTHROID) 50 MCG tablet Take 1 tablet by mouth every evening.      omeprazole (PRILOSEC) 20 MG capsule      ondansetron (ZOFRAN ODT) 4 MG disintegrating tablet Take 1 tablet (4 mg total) by mouth every 8 (eight) hours as needed for nausea. 30 tablet 0   promethazine (PHENERGAN) 12.5 MG tablet Take 1 tablet (12.5 mg total) by mouth every 8 (eight) hours as needed for nausea or vomiting. 30 tablet 0   No current facility-administered medications for this visit.  Family History  Problem Relation Age of Onset   Hypertension Mother    Breast cancer Mother    Cancer Father    Heart disease Father        bypass   Diabetes Father    Breast cancer Maternal Grandmother            Diabetes Paternal Grandfather    Congestive Heart Failure Maternal Aunt    Colon cancer Neg Hx    Esophageal cancer Neg Hx    Rectal cancer Neg Hx    Stomach cancer Neg Hx     ROS:  gynecologic: negative  Exam:   BP 120/70 (BP Location: Right Arm, Patient Position: Sitting, Cuff Size: Normal)   Pulse 74   Ht 5\' 3"  (1.6 m) Comment: reported  Wt 155 lb 12.8 oz (70.7 kg)   LMP 04/04/2005   BMI 27.60 kg/m   Height: 5\' 3"  (160 cm) (reported)  General appearance: alert, cooperative  and appears stated age Head: Normocephalic, without obvious abnormality, atraumatic Neck: no adenopathy, supple, symmetrical, trachea midline and thyroid normal to inspection and palpation Lungs: clear to auscultation bilaterally Breasts: normal appearance, no masses or tenderness Heart: regular rate and rhythm Abdomen: soft, non-tender; bowel sounds normal; no masses,  no organomegaly Extremities: extremities normal, atraumatic, no cyanosis or edema Skin: Skin color, texture, turgor normal. No rashes or lesions Lymph nodes: Cervical, supraclavicular, and axillary nodes normal. No abnormal inguinal nodes palpated Neurologic: Grossly normal  Pelvic: External genitalia:  no lesions              Urethra:  normal appearing urethra with no masses, tenderness or lesions              Bartholins and Skenes: normal                 Vagina: normal appearing vagina with normal color and no discharge, no lesions              Cervix: absent              Pap taken: No. Bimanual Exam:  Uterus:  uterus absent              Adnexa: no mass, fullness, tenderness               Rectovaginal: Confirms               Anus:  normal sphincter tone, no lesions  Chaperone, 06/02/2005, CMA, was present for exam.  Assessment/Plan: 1. Well woman exam with routine gynecological exam - pap smear not indicated due hysterectomy hx - MMG 11/2020 - Colonoscopy 2013.  This is due.  Was done with [facility].  Referral placed again.   - BMD 11/2019 with osteopenia - lab work done with PCP/bariatric office - vaccines updated.  Tdap due today.  2. Increased risk of breast cancer - yearly breast MRI recommended due to 22.5% lifetime risk.  Has MRI 05/2021.  Plan again next year.  3. Bariatric surgery status  4. Family history of breast cancer  5. Osteopenia, unspecified location - done 2021

## 2021-10-07 DIAGNOSIS — G518 Other disorders of facial nerve: Secondary | ICD-10-CM | POA: Diagnosis not present

## 2021-10-07 DIAGNOSIS — M791 Myalgia, unspecified site: Secondary | ICD-10-CM | POA: Diagnosis not present

## 2021-10-07 DIAGNOSIS — M542 Cervicalgia: Secondary | ICD-10-CM | POA: Diagnosis not present

## 2021-10-07 DIAGNOSIS — G43719 Chronic migraine without aura, intractable, without status migrainosus: Secondary | ICD-10-CM | POA: Diagnosis not present

## 2021-10-15 ENCOUNTER — Encounter: Payer: Self-pay | Admitting: Gastroenterology

## 2021-11-02 ENCOUNTER — Telehealth: Payer: Self-pay | Admitting: *Deleted

## 2021-11-02 NOTE — Progress Notes (Unsigned)
  Care Coordination  Outreach Note  11/02/2021 Name: DANAMARIE MINAMI MRN: [id number] DOB: [birth year]   Care Coordination Outreach Attempts  An unsuccessful telephone outreach was attempted today to offer the patient information about available care coordination services as a benefit of their health plan.   Follow Up Plan:  Additional outreach attempts will be made to offer the patient care coordination information and services.   Encounter Outcome:  No Answer  Gwenevere Ghazi  Care Coordination Care Guide  Direct Dial: [phone]

## 2021-11-02 NOTE — Chronic Care Management (AMB) (Unsigned)
  Care Coordination  Outreach Note  11/02/2021 Name: DAVIONA HERBERT MRN: [id number] DOB: 03/13/1961   Care Coordination Outreach Attempts  An unsuccessful telephone outreach was attempted today to offer the patient information about available care coordination services as a benefit of their health plan.   Follow Up Plan:  Additional outreach attempts will be made to offer the patient care coordination information and services.   Encounter Outcome:  Pt. Request to Call Back  [facility] Coordination Care Guide  Direct Dial: [phone]

## 2021-11-03 NOTE — Chronic Care Management (AMB) (Signed)
  Care Coordination   Note   11/03/2021 Name: Melissa Pitts MRN: [id number] DOB: 1961-03-01  Melissa Pitts is a 61 y.o. year old female who sees Assunta Found, MD for primary care. I reached out to Sherin Quarry by phone today to offer care coordination services.  Melissa Pitts was given information about Care Coordination services today including:   The Care Coordination services include support from the care team which includes your Nurse Coordinator, Clinical Social Worker, or Pharmacist.  The Care Coordination team is here to help remove barriers to the health concerns and goals most important to you. Care Coordination services are voluntary, and the patient may decline or stop services at any time by request to their care team member.   Care Coordination Consent Status: Patient agreed to services and verbal consent obtained.   Follow up plan:  Telephone appointment with care coordination team member scheduled for:  11/08/21  Encounter Outcome:  Pt. Scheduled

## 2021-11-08 ENCOUNTER — Encounter: Payer: Self-pay | Admitting: *Deleted

## 2021-11-08 ENCOUNTER — Ambulatory Visit: Payer: Self-pay | Admitting: *Deleted

## 2021-11-08 NOTE — Patient Outreach (Signed)
  Care Coordination   Initial Visit Note   11/08/2021 Name: Melissa Pitts MRN: [id number] DOB: 1960-05-10  Melissa Pitts is a 61 y.o. year old female who sees Assunta Found, MD for primary care. I spoke with  Sherin Quarry by phone today  What matters to the patients health and wellness today?  Counseling - State she is dealing with a lot in regards to family issues (caring for husband with depression and aging parents).     Goals Addressed               This Visit's Progress     Seek counseling and schedule yearly PCP visit (pt-stated)        Care Coordination Interventions: Advised patient to call PCP office for yearly visit Provided education to patient re: seeing PCP on regular basis, not just specialists.   Social Work referral for counseling sessions        SDOH assessments and interventions completed:  Yes     Care Coordination Interventions Activated:  Yes  Care Coordination Interventions:  Yes, provided   Follow up plan: Referral made to CSW    Encounter Outcome:  Pt. Visit Completed   Kemper Durie, RN, MSN, [facility] Care Coordinator [phone]

## 2021-11-08 NOTE — Patient Outreach (Signed)
  Care Coordination   11/08/2021 Name: Melissa Pitts MRN: [id number] DOB: Aug 19, 1960   Care Coordination Outreach Attempts:  An unsuccessful telephone outreach was attempted today to offer the patient information about available care coordination services as a benefit of their health plan.   Follow Up Plan:  Additional outreach attempts will be made to offer the patient care coordination information and services.   Encounter Outcome:  No Answer  Care Coordination Interventions Activated:  No   Care Coordination Interventions:  No, not indicated    Kemper Durie, RN, MSN, [facility] Care Coordinator [phone]

## 2021-11-10 ENCOUNTER — Ambulatory Visit (AMBULATORY_SURGERY_CENTER): Payer: Self-pay

## 2021-11-10 VITALS — Ht 63.0 in | Wt 152.0 lb

## 2021-11-10 DIAGNOSIS — Z1211 Encounter for screening for malignant neoplasm of colon: Secondary | ICD-10-CM

## 2021-11-10 MED ORDER — NA SULFATE-K SULFATE-MG SULF 17.5-3.13-1.6 GM/177ML PO SOLN: 1.0000 | Freq: Once | ORAL | 0 refills | Status: AC

## 2021-11-10 NOTE — Progress Notes (Signed)
No egg or soy allergy known to patient  No issues known to pt with past sedation with any surgeries or procedures Patient denies ever being told they had issues or difficulty with intubation  No FH of Malignant Hyperthermia Pt is not on diet pills Pt is not on home 02  Pt is not on blood thinners  Pt denies issues with constipation  No A fib or A flutter Have any cardiac testing pending--NO Pt instructed to use [email or web address] or GoodRx for a price reduction on prep  SingleCare card given to patient during PV appt;  

## 2021-11-11 ENCOUNTER — Other Ambulatory Visit: Payer: Self-pay | Admitting: *Deleted

## 2021-11-11 ENCOUNTER — Encounter: Payer: Self-pay | Admitting: *Deleted

## 2021-11-11 NOTE — Patient Outreach (Signed)
  Care Coordination   11/11/2021  Name: Melissa Pitts MRN: [id number] DOB: 10-07-1960   Care Coordination Outreach Attempts:  An unsuccessful telephone outreach was attempted today to offer the patient information about available care coordination services as a benefit of their health plan. HIPAA compliant message left on voicemail, providing contact information for CSW, encouraging patient to return CSW's call at her earliest convenience.  Follow Up Plan:  Additional outreach attempts will be made to offer the patient care coordination information and services.   Encounter Outcome:  No Answer.   Care Coordination Interventions Activated:  No    Care Coordination Interventions:  No, not indicated.    Danford Bad, BSW, MSW, LCSW  Licensed Restaurant manager, [employer] [facility]  Mailing [place] [address], [place], [place] [address] Physical Address-[address] [place], [place] [address] Toll Free Main # [phone] Fax # [phone] Cell # [phone] [email or web address]

## 2021-11-16 ENCOUNTER — Telehealth: Payer: Self-pay | Admitting: *Deleted

## 2021-11-16 NOTE — Chronic Care Management (AMB) (Signed)
  Care Coordination  Outreach Note  11/16/2021 Name: Melissa Pitts MRN: [id number] DOB: [birth year]   Care Coordination Outreach Attempts  An unsuccessful telephone outreach was attempted today to offer the patient information about available care coordination services as a benefit of their health plan.   Rescheduling with LCSW   Follow Up Plan:  Additional outreach attempts will be made to offer the patient care coordination information and services.   Encounter Outcome:  No Answer  Burman Nieves, CCMA Care Coordination Care Guide Direct Dial: [phone]

## 2021-11-19 NOTE — Chronic Care Management (AMB) (Unsigned)
  Care Coordination  Outreach Note  11/19/2021 Name: Melissa Pitts MRN: [id number] DOB: 06/02/1960   Care Coordination Outreach Attempts  A second unsuccessful outreach was attempted today to offer the patient with information about available care coordination services as a benefit of their health plan.    Rescheduling Licensed Clinical Social Worker Follow Up Plan:  Additional outreach attempts will be made to offer the patient care coordination information and services.   Encounter Outcome:  No Answer  Christie Nottingham  Care Coordination Care Guide  Direct Dial: [phone]

## 2021-11-22 DIAGNOSIS — Z1231 Encounter for screening mammogram for malignant neoplasm of breast: Secondary | ICD-10-CM | POA: Diagnosis not present

## 2021-11-22 NOTE — Chronic Care Management (AMB) (Signed)
  Care Coordination   Note   11/22/2021 Name: Melissa Pitts MRN: [id number] DOB: 02-19-1961  Melissa Pitts is a 62 y.o. year old female who sees Assunta Found, MD for primary care. Sherin Quarry called care guide by phone today to decline rescheduling care coordination services.    Encounter Outcome:  Pt. Refused  [facility] Coordination Care Guide  Direct Dial: [phone]

## 2021-12-02 ENCOUNTER — Encounter: Payer: Federal, State, Local not specified - PPO | Admitting: Gastroenterology

## 2021-12-02 ENCOUNTER — Encounter (HOSPITAL_BASED_OUTPATIENT_CLINIC_OR_DEPARTMENT_OTHER): Payer: Self-pay | Admitting: *Deleted

## 2021-12-07 ENCOUNTER — Telehealth (HOSPITAL_BASED_OUTPATIENT_CLINIC_OR_DEPARTMENT_OTHER): Payer: Self-pay

## 2021-12-07 NOTE — Telephone Encounter (Signed)
Patient called today with concerns about vaginal dryness. She states it is getting worse especially with intercourse. Please advise. tbw

## 2021-12-10 NOTE — Telephone Encounter (Signed)
Called and spoke with patient about the below message. Patient will try the OTC/ online suggestions first. She will give [place] a call back if neither of these things work. tbw

## 2021-12-20 ENCOUNTER — Other Ambulatory Visit (HOSPITAL_BASED_OUTPATIENT_CLINIC_OR_DEPARTMENT_OTHER): Payer: Self-pay | Admitting: Obstetrics & Gynecology

## 2021-12-20 DIAGNOSIS — Z9884 Bariatric surgery status: Secondary | ICD-10-CM

## 2021-12-21 ENCOUNTER — Ambulatory Visit
Admission: EM | Admit: 2021-12-21 | Discharge: 2021-12-21 | Disposition: A | Discharge status: Home / Self Care | Payer: Federal, State, Local not specified - PPO | Attending: Family Medicine | Admitting: Family Medicine

## 2021-12-21 ENCOUNTER — Encounter: Payer: Self-pay | Admitting: Emergency Medicine

## 2021-12-21 ENCOUNTER — Other Ambulatory Visit: Payer: Self-pay

## 2021-12-21 DIAGNOSIS — R5383 Other fatigue: Secondary | ICD-10-CM

## 2021-12-21 DIAGNOSIS — R531 Weakness: Secondary | ICD-10-CM | POA: Diagnosis not present

## 2021-12-21 LAB — POCT URINALYSIS DIP (MANUAL ENTRY)
Bilirubin, UA: NEGATIVE
Blood, UA: NEGATIVE
Glucose, UA: NEGATIVE mg/dL
Ketones, POC UA: NEGATIVE mg/dL
Nitrite, UA: NEGATIVE
Protein Ur, POC: NEGATIVE mg/dL
Spec Grav, UA: 1.01 (ref 1.010–1.025)
Urobilinogen, UA: 1 E.U./dL
pH, UA: 6 (ref 5.0–8.0)

## 2021-12-21 LAB — POCT FASTING CBG KUC MANUAL ENTRY: POCT Glucose (KUC): 86 mg/dL (ref 70–99)

## 2021-12-21 NOTE — ED Triage Notes (Signed)
Pt reports abdominal pain, constipation, fatigue for last several days. Pt reports last BM yesterday and denies any black stools, rectal bleeding.

## 2021-12-21 NOTE — ED Provider Notes (Signed)
[facility]    CSN: [id number] Arrival date & time: 12/21/21  1223      History   Chief Complaint Chief Complaint  Patient presents with   Fatigue    HPI Melissa Pitts is a 61 y.o. female.   Patient presenting today with 2 to 3-week history of severe fatigue, weakness.  She states she is also been having some intermittent issues with constipation but that is been ongoing since starting any migraine medication earlier this year.  Last bowel movement was this morning per patient and normal.  She denies fever, chills, body aches, cough, chest pain, shortness of breath, extremity numbness tingling, dizziness, mental status changes.  Not trying anything over-the-counter for symptoms.  States she has been under tremendous stress recently and has not been exercising or eating the way that she should.  She does have a history of gastric bypass, of note.    Past Medical History:  Diagnosis Date   Allergy    Breast mass    Depression    Gallstone    History of diabetes mellitus    history of, had gastric bypass, no longer needs medications   History of kidney stones    Hypertension    history of=before gastric- by-pass   Hypothyroidism    on meds   Migraines    Sleep apnea    no longer wears CPAP=no longer needs since bypass   Thyroid disease    hypothyroid    Patient Active Problem List   Diagnosis Date Noted   Migraines 04/09/2021   Other allergic rhinitis 02/17/2021   Frequent episodes of sinusitis 02/17/2021   Osteopenia 08/17/2020   Increased risk of breast cancer 08/17/2020   Family history of breast cancer 08/17/2020   Hypothyroidism (acquired) 04/18/2018   Anastomotic ulcer 02/08/2013   Hypomagnesemia 01/01/2013   SIRS (systemic inflammatory response syndrome) (HCC) 12/24/2012   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   Bariatric surgery status 11/21/2012   Clinical depression 06/25/2012    Past Surgical History:  Procedure Laterality Date    bypass leak repair  11/21/12   EXTRACORPOREAL SHOCK WAVE LITHOTRIPSY Right 04/26/2018   Procedure: EXTRACORPOREAL SHOCK WAVE LITHOTRIPSY (ESWL);  Surgeon: Malen Gauze, MD;  Location: [facility];  Service: Urology;  Laterality: Right;   EXTRACORPOREAL SHOCK WAVE LITHOTRIPSY Right 06/11/2018   Procedure: EXTRACORPOREAL SHOCK WAVE LITHOTRIPSY (ESWL);  Surgeon: Malen Gauze, MD;  Location: [facility];  Service: Urology;  Laterality: Right;   GASTRIC BYPASS  11/14/12   HERNIA REPAIR  5/15   LAPAROSCOPIC APPENDECTOMY N/A 04/09/2020   Procedure: APPENDECTOMY LAPAROSCOPIC;  Surgeon: Lucretia Roers, MD;  Location: [facility];  Service: General;  Laterality: N/A;   VAGINAL HYSTERECTOMY  8/07    OB History     Gravida  2   Para  1   Term      Preterm      AB  1   Living  1      SAB  1   IAB      Ectopic      Multiple      Live Births               Home Medications    Prior to Admission medications   Medication Sig Start Date End Date Taking? Authorizing Provider  CALCIUM PO Take 1 tablet by mouth 2 (two) times daily.    [provider]  cholecalciferol (VITAMIN D3) 25 MCG (1000 UT) tablet Take 1,000  Units by mouth daily.    [provider]  Cyanocobalamin (B-12 SL) Place 1 tablet under the tongue daily.    [provider]  escitalopram (LEXAPRO) 20 MG tablet Take 20 mg by mouth at bedtime.  05/29/13   [provider]  levothyroxine (SYNTHROID, LEVOTHROID) 50 MCG tablet Take 1 tablet by mouth every evening.  03/07/11   [provider]  phentermine 15 MG capsule Take 15 mg by mouth daily. 11/02/21   [provider]  promethazine (PHENERGAN) 12.5 MG tablet Take 1 tablet (12.5 mg total) by mouth every 8 (eight) hours as needed for nausea or vomiting. 08/17/20   Jerene Bears, MD  topiramate (TOPAMAX) 50 MG tablet Take 50 mg by mouth daily. 10/13/21   [provider]  zonisamide (ZONEGRAN) 100 MG capsule Take 100 mg by  mouth daily. 10/07/21   [provider]    Family History Family History  Problem Relation Age of Onset   Hypertension Mother    Breast cancer Mother    Cancer Father    Heart disease Father        bypass   Diabetes Father    Congestive Heart Failure Maternal Aunt    Breast cancer Maternal Grandmother            Diabetes Paternal Grandfather    Colon cancer Neg Hx    Esophageal cancer Neg Hx    Rectal cancer Neg Hx    Stomach cancer Neg Hx    Colon polyps Neg Hx     Social History Social History   Tobacco Use   Smoking status: Never   Smokeless tobacco: Never  Vaping Use   Vaping Use: Never used  Substance Use Topics   Alcohol use: No   Drug use: No     Allergies   Nsaids, Sulfamethoxazole, Elemental sulfur, and Penicillins   Review of Systems Review of Systems Per HPI  Physical Exam Triage Vital Signs ED Triage Vitals [12/21/21 1249]  Enc Vitals Group     BP 99/66     Pulse Rate 91     Resp 20     Temp 98.3 F (36.8 C)     Temp Source Oral     SpO2 98 %     Weight      Height      Head Circumference      Peak Flow      Pain Score 0     Pain Loc      Pain Edu?      Excl. in GC?    Orthostatic VS for the past 24 hrs:  BP- Lying Pulse- Lying BP- Sitting BP- Standing at 0 minutes  12/21/21 1322 114/69 79 116/71 110/70    Updated Vital Signs BP 99/66 (BP Location: Right Arm)   Pulse 91   Temp 98.3 F (36.8 C) (Oral)   Resp 20   LMP 04/04/2005   SpO2 98%   Visual Acuity Right Eye Distance:   Left Eye Distance:   Bilateral Distance:    Right Eye Near:   Left Eye Near:    Bilateral Near:     Physical Exam Vitals and nursing note reviewed.  Constitutional:      Appearance: Normal appearance. She is not ill-appearing.  HENT:     Head: Atraumatic.     Mouth/Throat:     Mouth: Mucous membranes are moist.  Eyes:     Extraocular Movements: Extraocular movements intact.  Conjunctiva/sclera: Conjunctivae normal.   Cardiovascular:     Rate and Rhythm: Normal rate and regular rhythm.     Heart sounds: Normal heart sounds.  Pulmonary:     Effort: Pulmonary effort is normal.     Breath sounds: Normal breath sounds. No wheezing.  Abdominal:     General: Bowel sounds are normal. There is no distension.     Palpations: Abdomen is soft.     Tenderness: There is no abdominal tenderness. There is no guarding.  Musculoskeletal:        General: Normal range of motion.     Cervical back: Normal range of motion and neck supple.  Skin:    General: Skin is warm and dry.  Neurological:     General: No focal deficit present.     Mental Status: She is alert and oriented to person, place, and time.     Cranial Nerves: No cranial nerve deficit.     Motor: No weakness.     Gait: Gait normal.  Psychiatric:        Mood and Affect: Mood normal.        Thought Content: Thought content normal.        Judgment: Judgment normal.    UC Treatments / Results  Labs (all labs ordered are listed, but only abnormal results are displayed) Labs Reviewed  POCT URINALYSIS DIP (MANUAL ENTRY) - Abnormal; Notable for the following components:      Result Value   Leukocytes, UA Trace (*)    All other components within normal limits  CBC WITH DIFFERENTIAL/PLATELET  COMPREHENSIVE METABOLIC PANEL  TSH  VITAMIN D 25 HYDROXY (VIT D DEFICIENCY, FRACTURES)  POCT FASTING CBG [facility] MANUAL ENTRY    EKG   Radiology No results found.  Procedures Procedures (including critical care time)  Medications Ordered in UC Medications - No data to display  Initial Impression / Assessment and Plan / UC Course  I have reviewed the triage vital signs and the nursing notes.  Pertinent labs & imaging results that were available during my care of the patient were reviewed by me and considered in my medical decision making (see chart for details).     Presenting today for vague complaint of fatigue, generalized weakness doing day-to-day  tasks.  Her vital signs, including orthostatic vital signs were benign today, urinalysis and point-of-care glucose within normal limits, EKG without acute ST or T wave changes, normal sinus rhythm at 83 bpm.  Labs pending for further rule out, discussed improving dietary intake, adding a multivitamin, exercising and following up closely with primary care for further evaluation and management.  Final Clinical Impressions(s) / UC Diagnoses   Final diagnoses:  Other fatigue  Weakness     Discharge Instructions      Your testing today was reassuring.  There was no obvious cause identified for your significant fatigue.  Make sure to stay well-hydrated, eat a balanced diet, get plenty of exercise and take a good multivitamin.  Your labs are pending and we will let you know if anything looks abnormal there.  Follow-up with your primary care provider as soon as possible.    ED Prescriptions   None    PDMP not reviewed this encounter.   Volney American, [place] 12/21/21 1406

## 2021-12-21 NOTE — Discharge Instructions (Signed)
Your testing today was reassuring.  There was no obvious cause identified for your significant fatigue.  Make sure to stay well-hydrated, eat a balanced diet, get plenty of exercise and take a good multivitamin.  Your labs are pending and we will let you know if anything looks abnormal there.  Follow-up with your primary care provider as soon as possible.

## 2021-12-22 LAB — CBC WITH DIFFERENTIAL/PLATELET
Basophils Absolute: 0 10*3/uL (ref 0.0–0.2)
Basos: 1 %
EOS (ABSOLUTE): 0.1 10*3/uL (ref 0.0–0.4)
Eos: 1 %
Hematocrit: 37.1 % (ref 34.0–46.6)
Hemoglobin: 12.9 g/dL (ref 11.1–15.9)
Immature Grans (Abs): 0 10*3/uL (ref 0.0–0.1)
Immature Granulocytes: 0 %
Lymphocytes Absolute: 1.4 10*3/uL (ref 0.7–3.1)
Lymphs: 25 %
MCH: 34 pg — ABNORMAL HIGH (ref 26.6–33.0)
MCHC: 34.8 g/dL (ref 31.5–35.7)
MCV: 98 fL — ABNORMAL HIGH (ref 79–97)
Monocytes Absolute: 0.6 10*3/uL (ref 0.1–0.9)
Monocytes: 11 %
Neutrophils Absolute: 3.5 10*3/uL (ref 1.4–7.0)
Neutrophils: 62 %
Platelets: 206 10*3/uL (ref 150–450)
RBC: 3.79 x10E6/uL (ref 3.77–5.28)
RDW: 12.9 % (ref 11.7–15.4)
WBC: 5.7 10*3/uL (ref 3.4–10.8)

## 2021-12-22 LAB — COMPREHENSIVE METABOLIC PANEL
ALT: 15 IU/L (ref 0–32)
AST: 14 IU/L (ref 0–40)
Albumin/Globulin Ratio: 2.5 — ABNORMAL HIGH (ref 1.2–2.2)
Albumin: 4.3 g/dL (ref 3.9–4.9)
Alkaline Phosphatase: 86 IU/L (ref 44–121)
BUN/Creatinine Ratio: 27 (ref 12–28)
BUN: 16 mg/dL (ref 8–27)
Bilirubin Total: 0.2 mg/dL (ref 0.0–1.2)
CO2: 19 mmol/L — ABNORMAL LOW (ref 20–29)
Calcium: 8.9 mg/dL (ref 8.7–10.3)
Chloride: 107 mmol/L — ABNORMAL HIGH (ref 96–106)
Creatinine, Ser: 0.59 mg/dL (ref 0.57–1.00)
Globulin, Total: 1.7 g/dL (ref 1.5–4.5)
Glucose: 94 mg/dL (ref 70–99)
Potassium: 3.9 mmol/L (ref 3.5–5.2)
Sodium: 140 mmol/L (ref 134–144)
Total Protein: 6 g/dL (ref 6.0–8.5)
eGFR: 101 mL/min/{1.73_m2} (ref >59)

## 2021-12-22 LAB — VITAMIN D 25 HYDROXY (VIT D DEFICIENCY, FRACTURES): Vit D, 25-Hydroxy: 37.9 ng/mL (ref 30.0–100.0)

## 2021-12-22 LAB — TSH: TSH: 0.359 u[IU]/mL — ABNORMAL LOW (ref 0.450–4.500)

## 2021-12-24 DIAGNOSIS — G518 Other disorders of facial nerve: Secondary | ICD-10-CM | POA: Diagnosis not present

## 2021-12-24 DIAGNOSIS — M791 Myalgia, unspecified site: Secondary | ICD-10-CM | POA: Diagnosis not present

## 2021-12-24 DIAGNOSIS — G43719 Chronic migraine without aura, intractable, without status migrainosus: Secondary | ICD-10-CM | POA: Diagnosis not present

## 2021-12-24 DIAGNOSIS — M542 Cervicalgia: Secondary | ICD-10-CM | POA: Diagnosis not present

## 2021-12-30 DIAGNOSIS — R110 Nausea: Secondary | ICD-10-CM | POA: Diagnosis not present

## 2021-12-30 DIAGNOSIS — Z9884 Bariatric surgery status: Secondary | ICD-10-CM | POA: Diagnosis not present

## 2022-01-09 ENCOUNTER — Other Ambulatory Visit: Payer: Self-pay

## 2022-01-09 ENCOUNTER — Emergency Department (HOSPITAL_COMMUNITY)
Admission: EM | Admit: 2022-01-09 | Discharge: 2022-01-09 | Disposition: A | Discharge status: Home / Self Care | Payer: Federal, State, Local not specified - PPO | Attending: Emergency Medicine | Admitting: Emergency Medicine

## 2022-01-09 ENCOUNTER — Emergency Department (HOSPITAL_COMMUNITY): Payer: Federal, State, Local not specified - PPO

## 2022-01-09 ENCOUNTER — Encounter (HOSPITAL_COMMUNITY): Payer: Self-pay

## 2022-01-09 DIAGNOSIS — K6389 Other specified diseases of intestine: Secondary | ICD-10-CM | POA: Diagnosis not present

## 2022-01-09 DIAGNOSIS — R319 Hematuria, unspecified: Secondary | ICD-10-CM | POA: Insufficient documentation

## 2022-01-09 DIAGNOSIS — R109 Unspecified abdominal pain: Secondary | ICD-10-CM

## 2022-01-09 DIAGNOSIS — K529 Noninfective gastroenteritis and colitis, unspecified: Secondary | ICD-10-CM

## 2022-01-09 DIAGNOSIS — K8020 Calculus of gallbladder without cholecystitis without obstruction: Secondary | ICD-10-CM | POA: Diagnosis not present

## 2022-01-09 DIAGNOSIS — K429 Umbilical hernia without obstruction or gangrene: Secondary | ICD-10-CM | POA: Diagnosis not present

## 2022-01-09 DIAGNOSIS — K449 Diaphragmatic hernia without obstruction or gangrene: Secondary | ICD-10-CM | POA: Diagnosis not present

## 2022-01-09 LAB — CBC WITH DIFFERENTIAL/PLATELET
Abs Immature Granulocytes: 0.02 10*3/uL (ref 0.00–0.07)
Basophils Absolute: 0 10*3/uL (ref 0.0–0.1)
Basophils Relative: 1 %
Eosinophils Absolute: 0.1 10*3/uL (ref 0.0–0.5)
Eosinophils Relative: 1 %
HCT: 39.2 % (ref 36.0–46.0)
Hemoglobin: 13.2 g/dL (ref 12.0–15.0)
Immature Granulocytes: 0 %
Lymphocytes Relative: 22 %
Lymphs Abs: 1.6 10*3/uL (ref 0.7–4.0)
MCH: 33.2 pg (ref 26.0–34.0)
MCHC: 33.7 g/dL (ref 30.0–36.0)
MCV: 98.7 fL (ref 80.0–100.0)
Monocytes Absolute: 0.6 10*3/uL (ref 0.1–1.0)
Monocytes Relative: 9 %
Neutro Abs: 5 10*3/uL (ref 1.7–7.7)
Neutrophils Relative %: 67 %
Platelets: 203 10*3/uL (ref 150–400)
RBC: 3.97 MIL/uL (ref 3.87–5.11)
RDW: 13.9 % (ref 11.5–15.5)
WBC: 7.4 10*3/uL (ref 4.0–10.5)
nRBC: 0 % (ref 0.0–0.2)

## 2022-01-09 LAB — URINALYSIS, ROUTINE W REFLEX MICROSCOPIC
Bilirubin Urine: NEGATIVE
Glucose, UA: NEGATIVE mg/dL
Ketones, ur: NEGATIVE mg/dL
Leukocytes,Ua: NEGATIVE
Nitrite: NEGATIVE
Protein, ur: NEGATIVE mg/dL
RBC / HPF: >50 RBC/hpf — ABNORMAL HIGH (ref 0–5)
Specific Gravity, Urine: 1.009 (ref 1.005–1.030)
pH: 7 (ref 5.0–8.0)

## 2022-01-09 LAB — COMPREHENSIVE METABOLIC PANEL
ALT: 21 U/L (ref 0–44)
AST: 21 U/L (ref 15–41)
Albumin: 3.7 g/dL (ref 3.5–5.0)
Alkaline Phosphatase: 83 U/L (ref 38–126)
Anion gap: 7 (ref 5–15)
BUN: 10 mg/dL (ref 8–23)
CO2: 25 mmol/L (ref 22–32)
Calcium: 8.8 mg/dL — ABNORMAL LOW (ref 8.9–10.3)
Chloride: 106 mmol/L (ref 98–111)
Creatinine, Ser: 0.68 mg/dL (ref 0.44–1.00)
GFR, Estimated: >60 mL/min (ref >60)
Glucose, Bld: 111 mg/dL — ABNORMAL HIGH (ref 70–99)
Potassium: 3.9 mmol/L (ref 3.5–5.1)
Sodium: 138 mmol/L (ref 135–145)
Total Bilirubin: 0.5 mg/dL (ref 0.3–1.2)
Total Protein: 6.3 g/dL — ABNORMAL LOW (ref 6.5–8.1)

## 2022-01-09 LAB — LIPASE, BLOOD: Lipase: 29 U/L (ref 11–51)

## 2022-01-09 MED ORDER — SODIUM CHLORIDE 0.9 % IV BOLUS
1000.0000 mL | Freq: Once | INTRAVENOUS | Status: AC
  Administered 2022-01-09: 1000 mL via INTRAVENOUS

## 2022-01-09 MED ORDER — ONDANSETRON HCL 4 MG PO TABS: 4.0000 mg | ORAL_TABLET | Freq: Four times a day (QID) | ORAL | 0 refills | Status: DC

## 2022-01-09 MED ORDER — ONDANSETRON HCL 4 MG/2ML IJ SOLN
4.0000 mg | Freq: Once | INTRAMUSCULAR | Status: AC
  Administered 2022-01-09: 4 mg via INTRAVENOUS
  Filled 2022-01-09: qty 2

## 2022-01-09 MED ORDER — MORPHINE SULFATE (PF) 4 MG/ML IV SOLN
4.0000 mg | Freq: Once | INTRAVENOUS | Status: AC
Start: 2022-01-09 — End: 2022-01-09
  Administered 2022-01-09: 4 mg via INTRAVENOUS
  Filled 2022-01-09: qty 1

## 2022-01-09 NOTE — ED Triage Notes (Signed)
Pt states "I think I may be having a kidney stone." Symptoms pain in left lower side and pain in back.   Nausea

## 2022-01-09 NOTE — ED Notes (Signed)
Pt to CT at this time.

## 2022-01-09 NOTE — ED Provider Notes (Signed)
[facility] EMERGENCY DEPARTMENT Provider Note   CSN: [id number] Arrival date & time: 01/09/22  1107     History  No chief complaint on file.   Melissa Pitts is a 61 y.o. female.  60 yo female presenting for flank pain. Pt presents with left sided flank pain with radiation to the left upper abdomen x several days. Previous hx of nephrolithiasis. Last episode two years ago. Pt denies dysuria or difficulty urinating. Denies blood in urine. Denies nausea, vomiting, or fevers.   The history is provided by the patient. No language interpreter was used.       Home Medications Prior to Admission medications   Medication Sig Start Date End Date Taking? Authorizing Provider  ondansetron (ZOFRAN) 4 MG tablet Take 1 tablet (4 mg total) by mouth every 6 (six) hours. 01/09/22  Yes Edwin Dada P, DO  CALCIUM PO Take 1 tablet by mouth 2 (two) times daily.    [provider]  cholecalciferol (VITAMIN D3) 25 MCG (1000 UT) tablet Take 1,000 Units by mouth daily.    [provider]  Cyanocobalamin (B-12 SL) Place 1 tablet under the tongue daily.    [provider]  escitalopram (LEXAPRO) 20 MG tablet Take 20 mg by mouth at bedtime.  05/29/13   [provider]  levothyroxine (SYNTHROID, LEVOTHROID) 50 MCG tablet Take 1 tablet by mouth every evening.  03/07/11   [provider]  phentermine 15 MG capsule Take 15 mg by mouth daily. 11/02/21   [provider]  promethazine (PHENERGAN) 12.5 MG tablet TAKE (1) TABLET BY MOUTH EVERY EIGHT HOURS AS NEEDED FOR NAUSEA OR VOMITING. 12/21/21   Jerene Bears, MD  topiramate (TOPAMAX) 50 MG tablet Take 50 mg by mouth daily. 10/13/21   [provider]  zonisamide (ZONEGRAN) 100 MG capsule Take 100 mg by mouth daily. 10/07/21   [provider]      Allergies    Nsaids, Sulfamethoxazole, Elemental sulfur, and Penicillins    Review of Systems   Review of Systems  Constitutional:  Negative for  chills and fever.  HENT:  Negative for ear pain and sore throat.   Eyes:  Negative for pain and visual disturbance.  Respiratory:  Negative for cough and shortness of breath.   Cardiovascular:  Negative for chest pain and palpitations.  Gastrointestinal:  Positive for abdominal pain. Negative for vomiting.  Genitourinary:  Positive for flank pain. Negative for decreased urine volume, dysuria and hematuria.  Musculoskeletal:  Negative for arthralgias and back pain.  Skin:  Negative for color change and rash.  Neurological:  Negative for seizures and syncope.  All other systems reviewed and are negative.   Physical Exam Updated Vital Signs BP 127/73 (BP Location: Right Arm)   Pulse 87   Temp 97.8 F (36.6 C) (Oral)   Resp 16   Ht 5\' 3"  (1.6 m)   Wt 63.5 kg   LMP 04/04/2005   SpO2 100%   BMI 24.80 kg/m  Physical Exam Vitals and nursing note reviewed.  Constitutional:      General: She is not in acute distress.    Appearance: She is well-developed.  HENT:     Head: Normocephalic and atraumatic.  Eyes:     Conjunctiva/sclera: Conjunctivae normal.  Cardiovascular:     Rate and Rhythm: Normal rate and regular rhythm.     Heart sounds: No murmur heard. Pulmonary:     Effort: Pulmonary effort is normal. No respiratory distress.  Breath sounds: Normal breath sounds.  Abdominal:     Palpations: Abdomen is soft.     Tenderness: There is no abdominal tenderness.  Musculoskeletal:        General: No swelling.     Cervical back: Neck supple.  Skin:    General: Skin is warm and dry.     Capillary Refill: Capillary refill takes less than 2 seconds.  Neurological:     Mental Status: She is alert.  Psychiatric:        Mood and Affect: Mood normal.     ED Results / Procedures / Treatments   Labs (all labs ordered are listed, but only abnormal results are displayed) Labs Reviewed  COMPREHENSIVE METABOLIC PANEL - Abnormal; Notable for the following components:      Result  Value   Glucose, Bld 111 (*)    Calcium 8.8 (*)    Total Protein 6.3 (*)    All other components within normal limits  URINALYSIS, ROUTINE W REFLEX MICROSCOPIC - Abnormal; Notable for the following components:   Hgb urine dipstick MODERATE (*)    RBC / HPF >50 (*)    Bacteria, UA RARE (*)    All other components within normal limits  CBC WITH DIFFERENTIAL/PLATELET  LIPASE, BLOOD    EKG None  Radiology CT Renal Stone Study  Result Date: 01/09/2022 CLINICAL DATA:  Left flank pain, back pain EXAM: CT ABDOMEN AND PELVIS WITHOUT CONTRAST TECHNIQUE: Multidetector CT imaging of the abdomen and pelvis was performed following the standard protocol without IV contrast. RADIATION DOSE REDUCTION: This exam was performed according to the departmental dose-optimization program which includes automated exposure control, adjustment of the mA and/or kV according to patient size and/or use of iterative reconstruction technique. COMPARISON:  04/09/2020 FINDINGS: Lower chest: Small linear densities in the lower lung fields may suggest minimal scarring. Hepatobiliary: No focal abnormalities are seen in liver. There is no dilation of bile ducts. Large gallbladder stone is seen. There is no fluid around the gallbladder. Pancreas: No focal abnormalities are seen Spleen: Unremarkable. Adrenals/Urinary Tract: Adrenals are unremarkable. There is no hydronephrosis. There is possible parapelvic cysts in left kidney with no significant interval change. There are no renal or ureteral stones. Ureters are not dilated. Urinary bladder is not distended. Stomach/Bowel: There is small hiatal hernia. Surgical staples are seen in the stomach. There is mild dilation of small-bowel loops with fluid in the lumen. Staple lines are seen in small bowel loops in right mid abdomen. Focal dilation of small bowel loop adjacent to the surgical staples may be postsurgical change. Appendix is not seen. Scattered diverticula are seen in colon.  There is no evidence of focal acute diverticulitis. Vascular/Lymphatic: Left renal vein is retroaortic. Small scattered arterial calcifications are seen. Reproductive: Uterus is not seen. Other: There is no ascites or pneumoperitoneum. There is tiny umbilical hernia containing fat. Musculoskeletal: Degenerative changes are noted in lumbar spine, more so at L4-L5 level with encroachment of neural foramina. There is possible hemangioma in the body of T11 vertebra. IMPRESSION: There is no evidence of intestinal obstruction or pneumoperitoneum. There is mild dilation in few small bowel loops with fluid in the lumen, possibly suggesting ileus or nonspecific enteritis. There is no significant hydronephrosis. Possible parapelvic cyst is seen in left kidney with no significant change. There are no renal or ureteral stones. Large gallbladder stone. Hiatal hernia. Few diverticula are seen in colon without signs of focal diverticulitis. Other findings as described in the body of the  report. Electronically Signed   By: Elmer Picker M.D.   On: 01/09/2022 12:22    Procedures Procedures    Medications Ordered in ED Medications  sodium chloride 0.9 % bolus 1,000 mL (1,000 mLs Intravenous New Bag/Given 01/09/22 1207)  morphine (PF) 4 MG/ML injection 4 mg (4 mg Intravenous Given 01/09/22 1208)  ondansetron (ZOFRAN) injection 4 mg (4 mg Intravenous Given 01/09/22 1208)    ED Course/ Medical Decision Making/ A&P                           Medical Decision Making Amount and/or Complexity of Data Reviewed Labs: ordered. Radiology: ordered.  Risk Prescription drug management.   1:14 PM 62 yo female presenting for flank pain with a previous history of nephrolithiasis.  Patient is alert and oriented x3, no acute distress, afebrile, stable vital signs.  Physical exam demonstrates no reproducible tenderness.  I personally interpreted patient's laboratory studies today.  Stable renal function.  No leukocytosis or  signs of sepsis.  UA demonstrates no urinary tract infection.  Doubt pyelonephritis.  Urine is positive for hematuria.  CT renal stone as interpreted by the radiologist demonstrates no renal or ureteral stones.  Some bowel changes concerning for enteritis.  Patient does report some nausea with abdominal pain.  Recommended to keep a close eye on the symptoms and return to emergency department for any worsening concerning signs including worsening pain or development of fevers for reevaluation.  Hematuria in the absence of a stone may be secondary to passing a stone prior to arrival to the emergency department.  Patient recommended for close follow-up with urology if symptoms do not improve in the next week.  Patient in no distress and overall condition improved here in the ED. Detailed discussions were had with the patient regarding current findings, and need for close f/u with PCP or on call doctor. The patient has been instructed to return immediately if the symptoms worsen in any way for re-evaluation. Patient verbalized understanding and is in agreement with current care plan. All questions answered prior to discharge.        Final Clinical Impression(s) / ED Diagnoses Final diagnoses:  Hematuria, unspecified type  Flank pain  Enteritis    Rx / DC Orders ED Discharge Orders          Ordered    ondansetron (ZOFRAN) 4 MG tablet  Every 6 hours        01/09/22 1312              Lianne Cure, DO 24/58/09 1314

## 2022-01-12 DIAGNOSIS — Z9884 Bariatric surgery status: Secondary | ICD-10-CM | POA: Diagnosis not present

## 2022-01-12 DIAGNOSIS — K449 Diaphragmatic hernia without obstruction or gangrene: Secondary | ICD-10-CM | POA: Diagnosis not present

## 2022-01-12 DIAGNOSIS — R110 Nausea: Secondary | ICD-10-CM | POA: Diagnosis not present

## 2022-02-17 DIAGNOSIS — F4323 Adjustment disorder with mixed anxiety and depressed mood: Secondary | ICD-10-CM | POA: Diagnosis not present

## 2022-03-01 DIAGNOSIS — F4323 Adjustment disorder with mixed anxiety and depressed mood: Secondary | ICD-10-CM | POA: Diagnosis not present

## 2022-03-07 ENCOUNTER — Telehealth: Payer: Self-pay | Admitting: *Deleted

## 2022-03-07 ENCOUNTER — Ambulatory Visit: Payer: Federal, State, Local not specified - PPO | Admitting: Gastroenterology

## 2022-03-07 ENCOUNTER — Encounter: Payer: Self-pay | Admitting: *Deleted

## 2022-03-07 ENCOUNTER — Encounter: Payer: Self-pay | Admitting: Gastroenterology

## 2022-03-07 VITALS — BP 119/73 | HR 77 | Temp 98.1°F | Ht 63.0 in | Wt 152.0 lb

## 2022-03-07 DIAGNOSIS — R110 Nausea: Secondary | ICD-10-CM | POA: Insufficient documentation

## 2022-03-07 DIAGNOSIS — K222 Esophageal obstruction: Secondary | ICD-10-CM

## 2022-03-07 DIAGNOSIS — K9189 Other postprocedural complications and disorders of digestive system: Secondary | ICD-10-CM | POA: Diagnosis not present

## 2022-03-07 NOTE — Patient Instructions (Signed)
Upper endoscopy with dilation of strictures noted on your upper GI series. See separate instructions.   We will plan on colonoscopy at a later date.

## 2022-03-07 NOTE — Telephone Encounter (Signed)
Pt left vm to return call.  LMTRC

## 2022-03-07 NOTE — Telephone Encounter (Signed)
LMTRC to schedule EGD +/-ED with Dr.Rourk ASA 2 

## 2022-03-07 NOTE — Progress Notes (Signed)
GI Office Note    Referring Provider: Katrine Coho, NP Primary Care Physician:  Assunta Found, MD  Primary Gastroenterologist: requested Dr. Jena Gauss  Chief Complaint   Chief Complaint  Patient presents with   New Patient (Initial Visit)    Pt referred for dysphagia and abnormal finding on GI imaging    History of Present Illness   Melissa Pitts is a 61 y.o. female presenting today at the request of Katrine Coho NP for further evaluation of dysphagia, abnormal finding on GI tract imaging. 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  Small bowel obstruction 2015 secondary to adhesive band over Roux limb.  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  Small hiatal hernia.  13 mm barium tablet briefly stuck at the GE junction before passing into the stomach after several additional sips of water.  The pill then lodged at the GJ anastomosis and did not pass.  CT renal stone protocol January 09, 2022: Mild dilation and few small bowel loops with fluid in the lumen, possibly suggesting ileus or nonspecific enteritis.  Large gallbladder stone.  Few diverticula but no diverticulitis.  Started having nausea in the fall. Symptoms intermittent.  Some days she feels fine.  No heartburn.  No trigger foods.  No abdominal pain. No vomiting. No dysphagia.  Bowel movements regular.  No blood in the stool or melena.  Maintaining weight.  Notes that over the summer she started having a lot of stress, went through a separation from her husband.  She lost some weight around that time but has been able to gain some back and is currently happy with her weight.  She was having a lot of migraine headaches which have improved as well.  She has been on Topamax for about a year.  She was on injections, possibly Botox, for migraines but has not  needed in a while.  Previously on zonisamide but has been able to come off of this as well.  Patient was unable to complete her colonoscopy last year because she was going through a separation.  She would like to have this done Dr. Jena Gauss once current issues are addressed.    Medications   Current Outpatient Medications  Medication Sig Dispense Refill   CALCIUM PO Take 1 tablet by mouth 2 (two) times daily.     cholecalciferol (VITAMIN D3) 25 MCG (1000 UT) tablet Take 1,000 Units by mouth daily.     Cyanocobalamin (B-12 SL) Place 1 tablet under the tongue daily.     escitalopram (LEXAPRO) 20 MG tablet Take 20 mg by mouth at bedtime.      levothyroxine (SYNTHROID, LEVOTHROID) 50 MCG tablet Take 1 tablet by mouth every evening.      ondansetron (ZOFRAN) 4 MG tablet Take 1 tablet (4 mg total) by mouth every 6 (six) hours. 12 tablet 0   promethazine (PHENERGAN) 12.5 MG tablet TAKE (1) TABLET BY MOUTH EVERY EIGHT HOURS AS NEEDED FOR NAUSEA OR VOMITING. 30 tablet 0   topiramate (TOPAMAX) 50 MG tablet Take 50 mg by mouth daily.     No current facility-administered medications for this visit.    Allergies   Allergies as of 03/07/2022 - Review Complete 03/07/2022  Allergen Reaction Noted   Nsaids Other (See Comments) 04/26/2018   Sulfamethoxazole Swelling 04/22/2013   Elemental sulfur Rash 04/06/2011  Penicillins Rash 04/06/2011    Past Medical History   Past Medical History:  Diagnosis Date   Allergy    Breast mass    Depression    Gallstone    History of diabetes mellitus    history of, had gastric bypass, no longer needs medications   History of kidney stones    Hypertension    history of=before gastric- by-pass   Hypothyroidism    on meds   Migraines    Sleep apnea    no longer wears CPAP=no longer needs since bypass   Thyroid disease    hypothyroid    Past Surgical History   Past Surgical History:  Procedure Laterality Date   bypass leak repair  11/21/12    EXTRACORPOREAL SHOCK WAVE LITHOTRIPSY Right 04/26/2018   Procedure: EXTRACORPOREAL SHOCK WAVE LITHOTRIPSY (ESWL);  Surgeon: Malen Gauze, MD;  Location: [facility];  Service: Urology;  Laterality: Right;   EXTRACORPOREAL SHOCK WAVE LITHOTRIPSY Right 06/11/2018   Procedure: EXTRACORPOREAL SHOCK WAVE LITHOTRIPSY (ESWL);  Surgeon: Malen Gauze, MD;  Location: [facility];  Service: Urology;  Laterality: Right;   GASTRIC BYPASS  11/14/12   HERNIA REPAIR  5/15   LAPAROSCOPIC APPENDECTOMY N/A 04/09/2020   Procedure: APPENDECTOMY LAPAROSCOPIC;  Surgeon: Lucretia Roers, MD;  Location: [facility];  Service: General;  Laterality: N/A;   VAGINAL HYSTERECTOMY  8/07    Past Family History   Family History  Problem Relation Age of Onset   Hypertension Mother    Breast cancer Mother    Cancer Father    Heart disease Father        bypass   Diabetes Father    Congestive Heart Failure Maternal Aunt    Breast cancer Maternal Grandmother            Diabetes Paternal Grandfather    Colon cancer Neg Hx    Esophageal cancer Neg Hx    Rectal cancer Neg Hx    Stomach cancer Neg Hx    Colon polyps Neg Hx     Past Social History   Social History   Socioeconomic History   Marital status: Married    Spouse name: Not on file   Number of children: Not on file   Years of education: Not on file   Highest education level: Not on file  Occupational History   Not on file  Tobacco Use   Smoking status: Never   Smokeless tobacco: Never  Vaping Use   Vaping Use: Never used  Substance and Sexual Activity   Alcohol use: No   Drug use: No   Sexual activity: Yes    Partners: Male    Birth control/protection: Surgical, None    Comment: TVH  Other Topics Concern   Not on file  Social History Narrative   Not on file   Social Determinants of Health   Financial Resource Strain: Not on file  Food Insecurity: No Food Insecurity (11/08/2021)   Hunger Vital Sign    Worried About Running Out of Food in the  Last Year: Never true    Ran Out of Food in the Last Year: Never true  Transportation Needs: No Transportation Needs (11/08/2021)   PRAPARE - Administrator, Civil Service (Medical): No    Lack of Transportation (Non-Medical): No  Physical Activity: Not on file  Stress: Not on file  Social Connections: Not on file  Intimate Partner Violence: Not on file    Review of Systems  General: Negative for anorexia, weight loss, fever, chills, fatigue, weakness. Eyes: Negative for vision changes.  ENT: Negative for hoarseness, difficulty swallowing , nasal congestion. CV: Negative for chest pain, angina, palpitations, dyspnea on exertion, peripheral edema.  Respiratory: Negative for dyspnea at rest, dyspnea on exertion, cough, sputum, wheezing.  GI: See history of present illness. GU:  Negative for dysuria, hematuria, urinary incontinence, urinary frequency, nocturnal urination.  MS: Negative for joint pain, low back pain.  Derm: Negative for rash or itching.  Neuro: Negative for weakness, abnormal sensation, seizure, frequent headaches, memory loss,  confusion.  Psych: Negative for anxiety, depression, suicidal ideation, hallucinations.  Endo: Negative for unusual weight change.  Heme: Negative for bruising or bleeding. Allergy: Negative for rash or hives.  Physical Exam   BP 119/73   Pulse 77   Temp 98.1 F (36.7 C)   Ht 5\' 3"  (1.6 m)   Wt 152 lb (68.9 kg)   LMP 04/04/2005   BMI 26.93 kg/m    General: Well-nourished, well-developed in no acute distress.  Head: Normocephalic, atraumatic.   Eyes: Conjunctiva pink, no icterus. Mouth: Oropharyngeal mucosa moist and pink , no lesions erythema or exudate. Neck: Supple without thyromegaly, masses, or lymphadenopathy.  Lungs: Clear to auscultation bilaterally.  Heart: Regular rate and rhythm, no murmurs rubs or gallops.  Abdomen: Bowel sounds are normal, nontender, nondistended, no hepatosplenomegaly or masses,  no  abdominal bruits or hernia, no rebound or guarding.   Rectal: Not performed Extremities: No lower extremity edema. No clubbing or deformities.  Neuro: Alert and oriented x 4 , grossly normal neurologically.  Skin: Warm and dry, no rash or jaundice.   Psych: Alert and cooperative, normal mood and affect.  Labs   Lab Results  Component Value Date   LIPASE 29 01/09/2022   Lab Results  Component Value Date   CREATININE 0.68 01/09/2022   BUN 10 01/09/2022   NA 138 01/09/2022   K 3.9 01/09/2022   CL 106 01/09/2022   CO2 25 01/09/2022   Lab Results  Component Value Date   ALT 21 01/09/2022   AST 21 01/09/2022   ALKPHOS 83 01/09/2022   BILITOT 0.5 01/09/2022   Lab Results  Component Value Date   WBC 7.4 01/09/2022   HGB 13.2 01/09/2022   HCT 39.2 01/09/2022   MCV 98.7 01/09/2022   PLT 203 01/09/2022    Imaging Studies   No results found.  Assessment   Nausea: Occurring for several months.  Complicated surgical history as outlined above.  Recent upper GI series with concern for narrowing at the GE junction in the GJ anastomosis.  Barium tablet did not pass through the GJ anastomosis.  It is possible that her nausea is secondary to these conditions.  She does not appreciate dysphagia.  She denies any abdominal pain, biliary etiology less likely.  Topamax may be contributing to her nausea.  Cancer screening: Patient interested in pursuing colonoscopy once upper GI symptoms addressed.   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  ASA 2.  I have discussed the risks, alternatives, benefits with regards to but not limited to the risk of reaction to medication, bleeding, infection, perforation and the patient is agreeable to proceed. Written consent to be obtained. We will plan for colonoscopy after first of the year.    03/11/2022. Leanna Battles, MHS, PA-C [facility]

## 2022-03-10 ENCOUNTER — Ambulatory Visit
Admission: EM | Admit: 2022-03-10 | Discharge: 2022-03-10 | Disposition: A | Discharge status: Home / Self Care | Payer: Federal, State, Local not specified - PPO | Attending: Nurse Practitioner | Admitting: Nurse Practitioner

## 2022-03-10 DIAGNOSIS — K130 Diseases of lips: Secondary | ICD-10-CM | POA: Insufficient documentation

## 2022-03-10 MED ORDER — VALACYCLOVIR HCL 1 G PO TABS: 1000.0000 mg | ORAL_TABLET | Freq: Every day | ORAL | 0 refills | Status: AC

## 2022-03-10 NOTE — Discharge Instructions (Signed)
We are testing the spot on your lip today for Herpes Simples Virus.  You will see the result on MyChart and we will call you with positive results in a few days.  In the meantime, go ahead and start on the Valtrex and take it daily for 5 days.

## 2022-03-10 NOTE — ED Provider Notes (Signed)
[facility]    CSN: [id number] Arrival date & time: 03/10/22  1530      History   Chief Complaint No chief complaint on file.   HPI Melissa Pitts is a 61 y.o. female.   Patient presents for lesion to lower lip on the left side that has been present since Monday.  Reports prior to the lesion developing, the area was a little bit tender.  It is not painful or itchy now.  She has tried Abreva without much benefit.  Reports around Thanksgiving, she had a similar lesion in the same area.  She does report a history of cold sores.  Reports she was doing some Internet searching and has a lot of questions about herpes virus.    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   Migraines 04/09/2021   Other allergic rhinitis 02/17/2021   Frequent episodes of sinusitis 02/17/2021   Osteopenia 08/17/2020   Increased risk of breast cancer 08/17/2020   Family history of breast cancer 08/17/2020   Hypothyroidism (acquired) 04/18/2018   Anastomotic ulcer 02/08/2013   Hypomagnesemia 01/01/2013   SIRS (systemic inflammatory response syndrome) (HCC) 12/24/2012   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   Bariatric surgery status 11/21/2012   Clinical depression 06/25/2012    Past Surgical History:  Procedure Laterality Date   bypass leak repair  11/21/12   EXTRACORPOREAL SHOCK WAVE LITHOTRIPSY Right 04/26/2018   Procedure: EXTRACORPOREAL  SHOCK WAVE LITHOTRIPSY (ESWL);  Surgeon: Malen Gauze, MD;  Location: [facility];  Service: Urology;  Laterality: Right;   EXTRACORPOREAL SHOCK WAVE LITHOTRIPSY Right 06/11/2018   Procedure: EXTRACORPOREAL SHOCK WAVE LITHOTRIPSY (ESWL);  Surgeon: Malen Gauze, MD;  Location: [facility];  Service: Urology;  Laterality: Right;   GASTRIC BYPASS  11/14/12   HERNIA REPAIR  5/15   LAPAROSCOPIC APPENDECTOMY N/A 04/09/2020   Procedure: APPENDECTOMY LAPAROSCOPIC;  Surgeon: Lucretia Roers, MD;  Location: [facility];  Service: General;  Laterality: N/A;   VAGINAL HYSTERECTOMY  8/07    OB History     Gravida  2   Para  1   Term      Preterm      AB  1   Living  1      SAB  1   IAB      Ectopic      Multiple      Live Births               Home Medications    Prior to Admission medications   Medication Sig Start Date End Date Taking? Authorizing Provider  valACYclovir (VALTREX) 1000 MG tablet Take 1 tablet (1,000 mg total) by mouth daily for 5 days. Take at onset of flare for 5 days 03/10/22 03/15/22 Yes Cathlean Marseilles A, NP  CALCIUM PO Take 1 tablet  by mouth 2 (two) times daily.    [provider]  cholecalciferol (VITAMIN D3) 25 MCG (1000 UT) tablet Take 1,000 Units by mouth daily.    [provider]  Cyanocobalamin (B-12 SL) Place 1 tablet under the tongue daily.    [provider]  escitalopram (LEXAPRO) 20 MG tablet Take 20 mg by mouth at bedtime.  05/29/13   [provider]  levothyroxine (SYNTHROID, LEVOTHROID) 50 MCG tablet Take 1 tablet by mouth every evening.  03/07/11   [provider]  ondansetron (ZOFRAN) 4 MG tablet Take 1 tablet (4 mg total) by mouth every 6 (six) hours. 01/09/22   Edwin Dada P, DO  promethazine (PHENERGAN) 12.5 MG tablet TAKE (1) TABLET BY MOUTH EVERY EIGHT HOURS AS NEEDED FOR NAUSEA OR VOMITING. 12/21/21   Jerene Bears, MD  topiramate (TOPAMAX) 50 MG tablet Take 50 mg by mouth daily. 10/13/21    [provider]    Family History Family History  Problem Relation Age of Onset   Hypertension Mother    Breast cancer Mother    Cancer Father    Heart disease Father        bypass   Diabetes Father    Congestive Heart Failure Maternal Aunt    Breast cancer Maternal Grandmother            Diabetes Paternal Grandfather    Colon cancer Neg Hx    Esophageal cancer Neg Hx    Rectal cancer Neg Hx    Stomach cancer Neg Hx    Colon polyps Neg Hx     Social History Social History   Tobacco Use   Smoking status: Never   Smokeless tobacco: Never  Vaping Use   Vaping Use: Never used  Substance Use Topics   Alcohol use: No   Drug use: No     Allergies   Nsaids, Sulfamethoxazole, Elemental sulfur, and Penicillins   Review of Systems Review of Systems Per HPI  Physical Exam Triage Vital Signs ED Triage Vitals  Enc Vitals Group     BP 03/10/22 1647 136/78     Pulse Rate 03/10/22 1647 87     Resp 03/10/22 1647 20     Temp 03/10/22 1647 98.4 F (36.9 C)     Temp Source 03/10/22 1647 Oral     SpO2 03/10/22 1647 98 %     Weight --      Height --      Head Circumference --      Peak Flow --      Pain Score 03/10/22 1652 0     Pain Loc --      Pain Edu? --      Excl. in GC? --    No data found.  Updated Vital Signs BP 136/78 (BP Location: Right Arm)   Pulse 87   Temp 98.4 F (36.9 C) (Oral)   Resp 20   LMP 04/04/2005   SpO2 98%   Visual Acuity Right Eye Distance:   Left Eye Distance:   Bilateral Distance:    Right Eye Near:   Left Eye Near:    Bilateral Near:     Physical Exam Vitals and nursing note reviewed.  Constitutional:      General: She is not in acute distress.    Appearance: Normal appearance. She is not toxic-appearing.  HENT:     Head: Normocephalic and atraumatic.      Comments: Dry, flesh-colored lesion to left side of  lower lip and approximately area marked.  No fluctuance, erythema, or drainage.    Mouth/Throat:      Mouth: Mucous membranes are moist.     Pharynx: Oropharynx is clear.  Pulmonary:     Effort: Pulmonary effort is normal. No respiratory distress.  Skin:    General: Skin is warm and dry.     Capillary Refill: Capillary refill takes less than 2 seconds.     Findings: Lesion present.  Neurological:     Mental Status: She is alert and oriented to person, place, and time.  Psychiatric:        Behavior: Behavior is cooperative.      UC Treatments / Results  Labs (all labs ordered are listed, but only abnormal results are displayed) Labs Reviewed  HSV CULTURE AND TYPING    EKG   Radiology No results found.  Procedures Procedures (including critical care time)  Medications Ordered in UC Medications - No data to display  Initial Impression / Assessment and Plan / UC Course  I have reviewed the triage vital signs and the nursing notes.  Pertinent labs & imaging results that were available during my care of the patient were reviewed by me and considered in my medical decision making (see chart for details).   Patient is well-appearing, normotensive, afebrile, not tachycardic, not tachypneic, oxygenating well on room air.   Lip lesion Suspicious for HSV-culture obtained today Treat empirically in meantime with Valtrex 1000 mg daily for 5 days All questions answered to the best of my ability Return precautions discussed with patient  The patient was given the opportunity to ask questions.  All questions answered to their satisfaction.  The patient is in agreement to this plan.    Final Clinical Impressions(s) / UC Diagnoses   Final diagnoses:  Lip lesion     Discharge Instructions      We are testing the spot on your lip today for Herpes Simples Virus.  You will see the result on MyChart and we will call you with positive results in a few days.  In the meantime, go ahead and start on the Valtrex and take it daily for 5 days.    ED Prescriptions     Medication Sig  Dispense Auth. Provider   valACYclovir (VALTREX) 1000 MG tablet Take 1 tablet (1,000 mg total) by mouth daily for 5 days. Take at onset of flare for 5 days 30 tablet Valentino Nose, NP      PDMP not reviewed this encounter.   Valentino Nose, NP 03/10/22 1719

## 2022-03-10 NOTE — ED Triage Notes (Signed)
Pt reports she has a fever blister on her bottom lip before thanksgiving it went away then another one this Monday. She also has a red sore mark on her columella.  Doesn't know if the two are related. She put abreva on it but doesn't think it helped.

## 2022-03-13 LAB — HSV CULTURE AND TYPING

## 2022-03-15 DIAGNOSIS — F4323 Adjustment disorder with mixed anxiety and depressed mood: Secondary | ICD-10-CM | POA: Diagnosis not present

## 2022-03-19 ENCOUNTER — Ambulatory Visit
Admission: EM | Admit: 2022-03-19 | Discharge: 2022-03-19 | Disposition: A | Discharge status: Home / Self Care | Payer: Federal, State, Local not specified - PPO | Attending: Family Medicine | Admitting: Family Medicine

## 2022-03-19 DIAGNOSIS — J069 Acute upper respiratory infection, unspecified: Secondary | ICD-10-CM | POA: Insufficient documentation

## 2022-03-19 DIAGNOSIS — Z1152 Encounter for screening for COVID-19: Secondary | ICD-10-CM | POA: Insufficient documentation

## 2022-03-19 DIAGNOSIS — J029 Acute pharyngitis, unspecified: Secondary | ICD-10-CM | POA: Diagnosis not present

## 2022-03-19 DIAGNOSIS — B9789 Other viral agents as the cause of diseases classified elsewhere: Secondary | ICD-10-CM | POA: Diagnosis not present

## 2022-03-19 DIAGNOSIS — R059 Cough, unspecified: Secondary | ICD-10-CM | POA: Diagnosis not present

## 2022-03-19 LAB — RESP PANEL BY RT-PCR (FLU A&B, COVID) ARPGX2
Influenza A by PCR: NEGATIVE
Influenza B by PCR: NEGATIVE
SARS Coronavirus 2 by RT PCR: NEGATIVE

## 2022-03-19 MED ORDER — PROMETHAZINE-DM 6.25-15 MG/5ML PO SYRP: 5.0000 mL | ORAL_SOLUTION | Freq: Four times a day (QID) | ORAL | 0 refills | Status: DC | PRN

## 2022-03-19 MED ORDER — FLUTICASONE PROPIONATE 50 MCG/ACT NA SUSP: 1.0000 | Freq: Two times a day (BID) | NASAL | 2 refills | Status: DC

## 2022-03-19 NOTE — ED Provider Notes (Signed)
[facility]    CSN: [id number] Arrival date & time: 03/19/22  1231      History   Chief Complaint No chief complaint on file.   HPI Melissa Pitts is a 61 y.o. female.   Presenting today with 4-day history of congestion, cough, sore throat, chills.  Denies known fever, chest pain, shortness of breath, abdominal pain, nausea vomiting or diarrhea.  So far trying Delsym with minimal relief.  History of seasonal allergies not currently on anything for this.    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   Migraines 04/09/2021   Other allergic rhinitis 02/17/2021   Frequent episodes of sinusitis 02/17/2021   Osteopenia 08/17/2020   Increased risk of breast cancer 08/17/2020   Family history of breast cancer 08/17/2020   Hypothyroidism (acquired) 04/18/2018   Anastomotic ulcer 02/08/2013   Hypomagnesemia 01/01/2013   SIRS (systemic inflammatory response syndrome) (HCC) 12/24/2012   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   Bariatric surgery status 11/21/2012   Clinical depression 06/25/2012    Past Surgical History:  Procedure Laterality Date   bypass leak repair  11/21/12   EXTRACORPOREAL SHOCK WAVE LITHOTRIPSY Right 04/26/2018   Procedure: EXTRACORPOREAL SHOCK WAVE LITHOTRIPSY (ESWL);  Surgeon: Malen Gauze, MD;  Location: [facility];  Service: Urology;  Laterality: Right;   EXTRACORPOREAL SHOCK WAVE LITHOTRIPSY  Right 06/11/2018   Procedure: EXTRACORPOREAL SHOCK WAVE LITHOTRIPSY (ESWL);  Surgeon: Malen Gauze, MD;  Location: [facility];  Service: Urology;  Laterality: Right;   GASTRIC BYPASS  11/14/12   HERNIA REPAIR  5/15   LAPAROSCOPIC APPENDECTOMY N/A 04/09/2020   Procedure: APPENDECTOMY LAPAROSCOPIC;  Surgeon: Lucretia Roers, MD;  Location: [facility];  Service: General;  Laterality: N/A;   VAGINAL HYSTERECTOMY  8/07    OB History     Gravida  2   Para  1   Term      Preterm      AB  1   Living  1      SAB  1   IAB      Ectopic      Multiple      Live Births               Home Medications    Prior to Admission medications   Medication Sig Start Date End Date Taking? Authorizing Provider  fluticasone (FLONASE) 50 MCG/ACT nasal spray Place 1 spray into both nostrils 2 (two) times daily. 03/19/22  Yes Particia Nearing, PA-C  promethazine-dextromethorphan (PROMETHAZINE-DM) 6.25-15 MG/5ML syrup Take 5 mLs by mouth 4 (four) times daily as needed. 03/19/22  Yes Particia Nearing, PA-C  CALCIUM PO Take 1 tablet by mouth 2 (two) times daily.    [provider]  cholecalciferol (VITAMIN D3) 25 MCG (1000 UT) tablet Take 1,000 Units by mouth  daily.    [provider]  Cyanocobalamin (B-12 SL) Place 1 tablet under the tongue daily.    [provider]  escitalopram (LEXAPRO) 20 MG tablet Take 20 mg by mouth at bedtime.  05/29/13   [provider]  levothyroxine (SYNTHROID, LEVOTHROID) 50 MCG tablet Take 1 tablet by mouth every evening.  03/07/11   [provider]  ondansetron (ZOFRAN) 4 MG tablet Take 1 tablet (4 mg total) by mouth every 6 (six) hours. 01/09/22   Edwin Dada P, DO  promethazine (PHENERGAN) 12.5 MG tablet TAKE (1) TABLET BY MOUTH EVERY EIGHT HOURS AS NEEDED FOR NAUSEA OR VOMITING. 12/21/21   Jerene Bears, MD  topiramate (TOPAMAX) 50 MG tablet Take 50 mg by mouth daily. 10/13/21   [provider]     Family History Family History  Problem Relation Age of Onset   Hypertension Mother    Breast cancer Mother    Cancer Father    Heart disease Father        bypass   Diabetes Father    Congestive Heart Failure Maternal Aunt    Breast cancer Maternal Grandmother            Diabetes Paternal Grandfather    Colon cancer Neg Hx    Esophageal cancer Neg Hx    Rectal cancer Neg Hx    Stomach cancer Neg Hx    Colon polyps Neg Hx     Social History Social History   Tobacco Use   Smoking status: Never   Smokeless tobacco: Never  Vaping Use   Vaping Use: Never used  Substance Use Topics   Alcohol use: No   Drug use: No     Allergies   Nsaids, Sulfamethoxazole, Elemental sulfur, and Penicillins   Review of Systems Review of Systems PER HPI  Physical Exam Triage Vital Signs ED Triage Vitals  Enc Vitals Group     BP 03/19/22 1442 119/76     Pulse Rate 03/19/22 1442 74     Resp 03/19/22 1442 20     Temp 03/19/22 1442 98.8 F (37.1 C)     Temp Source 03/19/22 1442 Oral     SpO2 03/19/22 1442 98 %     Weight --      Height --      Head Circumference --      Peak Flow --      Pain Score 03/19/22 1446 0     Pain Loc --      Pain Edu? --      Excl. in GC? --    No data found.  Updated Vital Signs BP 119/76 (BP Location: Right Arm)   Pulse 74   Temp 98.8 F (37.1 C) (Oral)   Resp 20   LMP 04/04/2005   SpO2 98%   Visual Acuity Right Eye Distance:   Left Eye Distance:   Bilateral Distance:    Right Eye Near:   Left Eye Near:    Bilateral Near:     Physical Exam Vitals and nursing note reviewed.  Constitutional:      Appearance: Normal appearance.  HENT:     Head: Atraumatic.     Right Ear: Tympanic membrane and external ear normal.     Left Ear: Tympanic membrane and external ear normal.     Nose: Rhinorrhea present.     Mouth/Throat:     Mouth: Mucous membranes are moist.     Pharynx: Posterior oropharyngeal erythema present.  Eyes:      Extraocular Movements: Extraocular movements intact.     Conjunctiva/sclera: Conjunctivae normal.  Cardiovascular:     Rate and Rhythm: Normal rate and regular rhythm.     Heart sounds: Normal heart sounds.  Pulmonary:     Effort: Pulmonary effort is normal.     Breath sounds: Normal breath sounds. No wheezing or rales.  Musculoskeletal:        General: Normal range of motion.     Cervical back: Normal range of motion and neck supple.  Skin:    General: Skin is warm and dry.  Neurological:     Mental Status: She is alert and oriented to person, place, and time.  Psychiatric:        Mood and Affect: Mood normal.        Thought Content: Thought content normal.      UC Treatments / Results  Labs (all labs ordered are listed, but only abnormal results are displayed) Labs Reviewed  RESP PANEL BY RT-PCR (FLU A&B, COVID) [id number]    EKG   Radiology No results found.  Procedures Procedures (including critical care time)  Medications Ordered in UC Medications - No data to display  Initial Impression / Assessment and Plan / UC Course  I have reviewed the triage vital signs and the nursing notes.  Pertinent labs & imaging results that were available during my care of the patient were reviewed by me and considered in my medical decision making (see chart for details).     Vitals and exam reassuring and suggestive of a viral respiratory infection.  Respiratory panel pending, treat with Flonase, Phenergan, supportive over-the-counter medications and home care.  Return for worsening symptoms.  Final Clinical Impressions(s) / UC Diagnoses   Final diagnoses:  Viral URI with cough   Discharge Instructions   None    ED Prescriptions     Medication Sig Dispense Auth. Provider   fluticasone (FLONASE) 50 MCG/ACT nasal spray Place 1 spray into both nostrils 2 (two) times daily. 16 g Roosvelt Maser [place], [place]   promethazine-dextromethorphan (PROMETHAZINE-DM) 6.25-15 MG/5ML  syrup Take 5 mLs by mouth 4 (four) times daily as needed. 100 mL Particia Nearing, [place]      PDMP not reviewed this encounter.   Particia Nearing, [place] 03/19/22 1512

## 2022-03-19 NOTE — ED Triage Notes (Signed)
Pt reports nasal congestion, cough, sore throat x 4 days.Att home covid test was neg. Took tylenol delsyum cough meds but only gave slight relief.

## 2022-03-23 ENCOUNTER — Ambulatory Visit: Payer: Self-pay

## 2022-03-23 DIAGNOSIS — E663 Overweight: Secondary | ICD-10-CM | POA: Diagnosis not present

## 2022-03-23 DIAGNOSIS — Z6827 Body mass index (BMI) 27.0-27.9, adult: Secondary | ICD-10-CM | POA: Diagnosis not present

## 2022-03-23 DIAGNOSIS — J069 Acute upper respiratory infection, unspecified: Secondary | ICD-10-CM | POA: Diagnosis not present

## 2022-03-23 DIAGNOSIS — E039 Hypothyroidism, unspecified: Secondary | ICD-10-CM | POA: Diagnosis not present

## 2022-04-06 NOTE — Anesthesia Preprocedure Evaluation (Signed)
Anesthesia Evaluation  Patient identified by MRN, date of birth, ID band Patient awake    Reviewed: Allergy & Precautions, H&P , NPO status , Patient's Chart, lab work & pertinent test results  Airway Mallampati: II  TM Distance: >3 FB Neck ROM: Full   Comment: Small mouth  Dental no notable dental hx.    Pulmonary sleep apnea    Pulmonary exam normal breath sounds clear to auscultation       Cardiovascular hypertension, Normal cardiovascular exam Rhythm:Regular Rate:Normal     Neuro/Psych  Headaches   Depression       GI/Hepatic negative GI ROS, Neg liver ROS,,,  Endo/Other  Hypothyroidism    Renal/GU negative Renal ROS  negative genitourinary   Musculoskeletal negative musculoskeletal ROS (+)    Abdominal   Peds negative pediatric ROS (+)  Hematology negative hematology ROS (+)   Anesthesia Other Findings   Reproductive/Obstetrics negative OB ROS                             Anesthesia Physical Anesthesia Plan  ASA: 2  Anesthesia Plan: General   Post-op Pain Management:    Induction:   PONV Risk Score and Plan:   Airway Management Planned: Natural Airway and Nasal Cannula  Additional Equipment:   Intra-op Plan:   Post-operative Plan:   Informed Consent: I have reviewed the patients History and Physical, chart, labs and discussed the procedure including the risks, benefits and alternatives for the proposed anesthesia with the patient or authorized representative who has indicated his/her understanding and acceptance.       Plan Discussed with: CRNA  Anesthesia Plan Comments:        Anesthesia Quick Evaluation

## 2022-04-07 ENCOUNTER — Ambulatory Visit (HOSPITAL_COMMUNITY): Payer: Federal, State, Local not specified - PPO | Admitting: Certified Registered"

## 2022-04-07 ENCOUNTER — Encounter (HOSPITAL_COMMUNITY): Payer: Self-pay | Admitting: Internal Medicine

## 2022-04-07 ENCOUNTER — Ambulatory Visit (HOSPITAL_COMMUNITY)
Admission: RE | Admit: 2022-04-07 | Discharge: 2022-04-07 | Disposition: A | Discharge status: Home / Self Care | Payer: Federal, State, Local not specified - PPO | Attending: Internal Medicine | Admitting: Internal Medicine

## 2022-04-07 ENCOUNTER — Other Ambulatory Visit: Payer: Self-pay

## 2022-04-07 ENCOUNTER — Encounter (HOSPITAL_COMMUNITY)
Admission: RE | Disposition: A | Discharge status: Home / Self Care | Payer: Self-pay | Source: Home / Self Care | Attending: Internal Medicine

## 2022-04-07 DIAGNOSIS — K449 Diaphragmatic hernia without obstruction or gangrene: Secondary | ICD-10-CM | POA: Insufficient documentation

## 2022-04-07 DIAGNOSIS — Z9884 Bariatric surgery status: Secondary | ICD-10-CM | POA: Insufficient documentation

## 2022-04-07 DIAGNOSIS — K2289 Other specified disease of esophagus: Secondary | ICD-10-CM | POA: Diagnosis not present

## 2022-04-07 DIAGNOSIS — K2281 Esophageal polyp: Secondary | ICD-10-CM | POA: Diagnosis not present

## 2022-04-07 DIAGNOSIS — R110 Nausea: Secondary | ICD-10-CM | POA: Insufficient documentation

## 2022-04-07 DIAGNOSIS — K222 Esophageal obstruction: Secondary | ICD-10-CM | POA: Diagnosis not present

## 2022-04-07 DIAGNOSIS — E039 Hypothyroidism, unspecified: Secondary | ICD-10-CM | POA: Insufficient documentation

## 2022-04-07 DIAGNOSIS — R1314 Dysphagia, pharyngoesophageal phase: Secondary | ICD-10-CM | POA: Diagnosis not present

## 2022-04-07 DIAGNOSIS — R1310 Dysphagia, unspecified: Secondary | ICD-10-CM | POA: Diagnosis not present

## 2022-04-07 DIAGNOSIS — F32A Depression, unspecified: Secondary | ICD-10-CM | POA: Insufficient documentation

## 2022-04-07 HISTORY — PX: ESOPHAGOGASTRODUODENOSCOPY (EGD) WITH PROPOFOL: SHX5813

## 2022-04-07 HISTORY — PX: BIOPSY: SHX5522

## 2022-04-07 HISTORY — PX: MALONEY DILATION: SHX5535

## 2022-04-07 SURGERY — ESOPHAGOGASTRODUODENOSCOPY (EGD) WITH PROPOFOL
Anesthesia: General

## 2022-04-07 MED ORDER — LIDOCAINE HCL (CARDIAC) PF 100 MG/5ML IV SOSY
PREFILLED_SYRINGE | INTRAVENOUS | Status: DC | PRN
  Administered 2022-04-07 (×2): 40 mg via INTRAVENOUS

## 2022-04-07 MED ORDER — PROPOFOL 10 MG/ML IV BOLUS
INTRAVENOUS | Status: DC | PRN
  Administered 2022-04-07: 20 mg via INTRAVENOUS
  Administered 2022-04-07: 30 mg via INTRAVENOUS
  Administered 2022-04-07: 50 mg via INTRAVENOUS
  Administered 2022-04-07 (×2): 20 mg via INTRAVENOUS
  Administered 2022-04-07: 50 mg via INTRAVENOUS
  Administered 2022-04-07: 30 mg via INTRAVENOUS

## 2022-04-07 MED ORDER — LACTATED RINGERS IV SOLN: INTRAVENOUS | Status: DC

## 2022-04-07 NOTE — Discharge Instructions (Addendum)
EGD Discharge instructions Please read the instructions outlined below and refer to this sheet in the next few weeks. These discharge instructions provide you with general information on caring for yourself after you leave the hospital. Your doctor may also give you specific instructions. While your treatment has been planned according to the most current medical practices available, unavoidable complications occasionally occur. If you have any problems or questions after discharge, please call your doctor. ACTIVITY You may resume your regular activity but move at a slower pace for the next 24 hours.  Take frequent rest periods for the next 24 hours.  Walking will help expel (get rid of) the air and reduce the bloated feeling in your abdomen.  No driving for 24 hours (because of the anesthesia (medicine) used during the test).  You may shower.  Do not sign any important legal documents or operate any machinery for 24 hours (because of the anesthesia used during the test).  NUTRITION Drink plenty of fluids.  You may resume your normal diet.  Begin with a light meal and progress to your normal diet.  Avoid alcoholic beverages for 24 hours or as instructed by your caregiver.  MEDICATIONS You may resume your normal medications unless your caregiver tells you otherwise.  WHAT YOU CAN EXPECT TODAY You may experience abdominal discomfort such as a feeling of fullness or "gas" pains.  FOLLOW-UP Your doctor will discuss the results of your test with you.  SEEK IMMEDIATE MEDICAL ATTENTION IF ANY OF THE FOLLOWING OCCUR: Excessive nausea (feeling sick to your stomach) and/or vomiting.  Severe abdominal pain and distention (swelling).  Trouble swallowing.  Temperature over 101 F (37.8 C).  Rectal bleeding or vomiting of blood.      Your esophagus appeared normal   Aside from a tiny nodule in the upper part.  It appeared benign.  I did take a small biopsy..  It was dilated today.  You have a small  hiatal hernia.  The connection between your stomach and small intestine was wide open.  No dilation needed there.    Further recommendations on the biopsy once I have the report back.    Office follow-up with Neil Crouch in 6 weeks   At patient request, I called Mannie Stabile at [phone] rolled to voicemail.  I left a message.

## 2022-04-07 NOTE — H&P (Signed)
@LOGO @   Primary Care Physician:  , MD Primary Gastroenterologist:  Dr. Assunta Found  Pre-Procedure History & Physical: HPI:  Melissa Pitts is a 62 y.o. female here for  further evaluation of esophageal dysphagia.  Abnormal barium pill esophagram (13 mm barium pill lodged at the GE junction and subsequently lodged at the gastro jejunal anastomosis.  Chronic nausea.  No vomiting.  Past Medical History:  Diagnosis Date   Allergy    Breast mass    Depression    Gallstone    History of diabetes mellitus    history of, had gastric bypass, no longer needs medications   History of kidney stones    Hypertension    history of=before gastric- by-pass   Hypothyroidism    on meds   Migraines    Sleep apnea    no longer wears CPAP=no longer needs since bypass   Thyroid disease    hypothyroid    Past Surgical History:  Procedure Laterality Date   bypass leak repair  11/21/12   EXTRACORPOREAL SHOCK WAVE LITHOTRIPSY Right 04/26/2018   Procedure: EXTRACORPOREAL SHOCK WAVE LITHOTRIPSY (ESWL);  Surgeon: 04/28/2018, MD;  Location: [facility];  Service: Urology;  Laterality: Right;   EXTRACORPOREAL SHOCK WAVE LITHOTRIPSY Right 06/11/2018   Procedure: EXTRACORPOREAL SHOCK WAVE LITHOTRIPSY (ESWL);  Surgeon: 08/11/2018, MD;  Location: [facility];  Service: Urology;  Laterality: Right;   GASTRIC BYPASS  11/14/12   HERNIA REPAIR  5/15   LAPAROSCOPIC APPENDECTOMY N/A 04/09/2020   Procedure: APPENDECTOMY LAPAROSCOPIC;  Surgeon: 06/07/2020, MD;  Location: [facility];  Service: General;  Laterality: N/A;   VAGINAL HYSTERECTOMY  8/07    Prior to Admission medications   Medication Sig Start Date End Date Taking? Authorizing Provider  azithromycin (ZITHROMAX) 250 MG tablet Take 250-500 mg by mouth See admin instructions. Take 500 mg on the first day then take 250 mg daily until finished 03/30/22  Yes [provider]  benzonatate (TESSALON) 200 MG capsule Take 200 mg by mouth 3  (three) times daily as needed for cough.   Yes [provider]  Cholecalciferol (VITAMIN D) 50 MCG (2000 UT) tablet Take 2,000 Units by mouth daily.   Yes [provider]  escitalopram (LEXAPRO) 20 MG tablet Take 20 mg by mouth at bedtime.  05/29/13  Yes [provider]  levothyroxine (SYNTHROID, LEVOTHROID) 50 MCG tablet Take 50 mcg by mouth every evening. 03/07/11  Yes [provider]  phentermine 15 MG capsule Take 15 mg by mouth daily. 03/17/22  Yes [provider]  topiramate (TOPAMAX) 50 MG tablet Take 50 mg by mouth daily. 10/13/21  Yes [provider]  vitamin C (ASCORBIC ACID) 250 MG tablet Take 250 mg by mouth daily.   Yes [provider]  fluticasone (FLONASE) 50 MCG/ACT nasal spray Place 1 spray into both nostrils 2 (two) times daily. Patient not taking: Reported on 04/01/2022 03/19/22   03/21/22, PA-C  ondansetron (ZOFRAN) 4 MG tablet Take 1 tablet (4 mg total) by mouth every 6 (six) hours. Patient taking differently: Take 4 mg by mouth every 8 (eight) hours as needed for vomiting or nausea. 01/09/22   03/11/22 P, DO  promethazine (PHENERGAN) 12.5 MG tablet TAKE (1) TABLET BY MOUTH EVERY EIGHT HOURS AS NEEDED FOR NAUSEA OR VOMITING. 12/21/21   12/23/21, MD  promethazine-dextromethorphan (PROMETHAZINE-DM) 6.25-15 MG/5ML syrup Take 5 mLs by mouth 4 (four) times daily as needed. Patient not taking:  Reported on 04/01/2022 03/19/22   Volney American, PA-C    Allergies as of 03/07/2022 - Review Complete 03/07/2022  Allergen Reaction Noted   Nsaids Other (See Comments) 04/26/2018   Sulfamethoxazole Swelling 04/22/2013   Elemental sulfur Rash 04/06/2011   Penicillins Rash 04/06/2011    Family History  Problem Relation Age of Onset   Hypertension Mother    Breast cancer Mother    Cancer Father    Heart disease Father        bypass   Diabetes Father    Congestive Heart Failure Maternal Aunt     Breast cancer Maternal Grandmother            Diabetes Paternal Grandfather    Colon cancer Neg Hx    Esophageal cancer Neg Hx    Rectal cancer Neg Hx    Stomach cancer Neg Hx    Colon polyps Neg Hx     Social History   Socioeconomic History   Marital status: Married    Spouse name: Not on file   Number of children: Not on file   Years of education: Not on file   Highest education level: Not on file  Occupational History   Not on file  Tobacco Use   Smoking status: Never   Smokeless tobacco: Never  Vaping Use   Vaping Use: Never used  Substance and Sexual Activity   Alcohol use: No   Drug use: No   Sexual activity: Yes    Partners: Male    Birth control/protection: Surgical, None    Comment: [facility]  Other Topics Concern   Not on file  Social History Narrative   Not on file   Social Determinants of Health   Financial Resource Strain: Not on file  Food Insecurity: No Food Insecurity (11/08/2021)   Hunger Vital Sign    Worried About Running Out of Food in the Last Year: Never true    Ran Out of Food in the Last Year: Never true  Transportation Needs: No Transportation Needs (11/08/2021)   PRAPARE - Hydrologist (Medical): No    Lack of Transportation (Non-Medical): No  Physical Activity: Not on file  Stress: Not on file  Social Connections: Not on file  Intimate Partner Violence: Not on file    Review of Systems: See HPI, otherwise negative ROS  Physical Exam: BP 131/65   Pulse 76   Temp 98.3 F (36.8 C) (Oral)   Resp 17   Ht 5\' 3"  (1.6 m)   Wt 64.4 kg   LMP 04/04/2005   SpO2 96%   BMI 25.15 kg/m  General:   Alert,  Well-developed, well-nourished, pleasant and cooperative in NAD Neck:  Supple; no masses or thyromegaly. No significant cervical adenopathy. Lungs:  Clear throughout to auscultation.   No wheezes, crackles, or rhonchi. No acute distress. Heart:  Regular rate and rhythm; no murmurs, clicks, rubs,  or  gallops. Abdomen: Non-distended, normal bowel sounds.  Soft and nontender without appreciable mass or hepatosplenomegaly.  Pulses:  Normal pulses noted. Extremities:  Without clubbing or edema.  Impression/Plan:    62 year old lady with intermittent nausea without vomiting.  History of gastric bypass.    Abnormal barium pill contrast study as described above.  I have offered the patient an EGD with potential for esophageal and anastomotic dilation as feasible/appropriate per plan. \The risks, benefits, limitations, alternatives and imponderables have been reviewed with the patient. Potential for esophageal dilation, biopsy, etc. have also  been reviewed.  Questions have been answered. All parties agreeable.      Notice: This dictation was prepared with Dragon dictation along with smaller phrase technology. Any transcriptional errors that result from this process are unintentional and may not be corrected upon review.

## 2022-04-07 NOTE — Anesthesia Postprocedure Evaluation (Signed)
Anesthesia Post Note  Patient: Melissa Pitts  Procedure(s) Performed: ESOPHAGOGASTRODUODENOSCOPY (EGD) WITH PROPOFOL [place]  Patient location during evaluation: Phase II Anesthesia Type: General Level of consciousness: awake and alert Pain management: pain level controlled Vital Signs Assessment: post-procedure vital signs reviewed and stable Respiratory status: spontaneous breathing, nonlabored ventilation, respiratory function stable and patient connected to nasal cannula oxygen Cardiovascular status: blood pressure returned to baseline and stable Postop Assessment: no apparent nausea or vomiting Anesthetic complications: no   There were no known notable events for this encounter.   Last Vitals:  Vitals:   04/07/22 0949 04/07/22 0952  BP: (!) 90/41 (!) 95/48  Pulse: 72 71  Resp: 16 17  Temp: 36.4 C   SpO2: 94% 96%    Last Pain:  Vitals:   04/07/22 0949  TempSrc: Oral  PainSc: 0-No pain                 Nicanor Alcon

## 2022-04-07 NOTE — Transfer of Care (Signed)
Immediate Anesthesia Transfer of Care Note  Patient: Melissa Pitts  Procedure(s) Performed: ESOPHAGOGASTRODUODENOSCOPY (EGD) WITH PROPOFOL [place]  Patient Location: PACU  Anesthesia Type:General  Level of Consciousness: drowsy and patient cooperative  Airway & Oxygen Therapy: Patient Spontanous Breathing  Post-op Assessment: Report given to RN and Post -op Vital signs reviewed and stable  Post vital signs: Reviewed and stable  Last Vitals:  Vitals Value Taken Time  BP 90/41 04/07/22 0949  Temp 36.4 C 04/07/22 0949  Pulse 72 04/07/22 0949  Resp 16 04/07/22 0949  SpO2 94 % 04/07/22 0949    Last Pain:  Vitals:   04/07/22 0949  TempSrc: Oral  PainSc: 0-No pain      Patients Stated Pain Goal: 6 (29/47/65 4650)  Complications: No notable events documented.

## 2022-04-07 NOTE — Op Note (Signed)
[facility] Patient Name: Melissa Pitts Procedure Date: 04/07/2022 9:07 AM MRN: [id number] Date of Birth: [birth year] Attending MD: Gennette Pac , MD, [id number] CSN: [id number] Age: 62 Admit Type: Outpatient Procedure:                Upper GI endoscopy Indications:              Dysphagia Providers:                Gennette Pac, MD, Angelica Ran, Dyann Ruddle Referring MD:              Medicines:                Propofol per Anesthesia Complications:            No immediate complications. Estimated Blood Loss:     Estimated blood loss was minimal. Procedure:                Pre-Anesthesia Assessment:                           - Prior to the procedure, a History and Physical                            was performed, and patient medications and                            allergies were reviewed. The patient's tolerance of                            previous anesthesia was also reviewed. The risks                            and benefits of the procedure and the sedation                            options and risks were discussed with the patient.                            All questions were answered, and informed consent                            was obtained. Prior Anticoagulants: The patient has                            taken no anticoagulant or antiplatelet agents. ASA                            Grade Assessment: III - A patient with severe                            systemic disease. After reviewing the risks and                            benefits, the patient was deemed in satisfactory  condition to undergo the procedure.                           After obtaining informed consent, the endoscope was                            passed under direct vision. Throughout the                            procedure, the patient's blood pressure, pulse, and                            oxygen saturations were monitored continuously. The                             GIF-H190 ([id number]) scope was introduced through the                            mouth, and advanced to the afferent jejunal loop. Scope In: 9:34:04 AM Scope Out: 9:44:44 AM Total Procedure Duration: 0 hours 10 minutes 40 seconds  Findings:      Pre-Anesthesia Assessment:      - Prior to the procedure, a History and Physical was performed, and       patient medications and allergies were reviewed. The patient's tolerance       of previous anesthesia was also reviewed. The risks and benefits of the       procedure and the sedation options and risks were discussed with the       patient. All questions were answered, and informed consent was obtained.       Prior Anticoagulants: The patient has taken no anticoagulant or       antiplatelet agents. ASA Grade Assessment: III - A patient with severe       systemic disease. After reviewing the risks and benefits, the patient       was deemed in satisfactory condition to undergo the procedure.      After obtaining informed consent, the endoscope was passed under direct       vision. Throughout the procedure, the patient's blood pressure, pulse,       and oxygen saturations were monitored continuously. The GIF-H190       ([id number]) scope was introduced through the mouth, and advanced to the       afferent and efferent jejunal loops. The upper GI endoscopy was       accomplished without difficulty. The patient tolerated the procedure       well.      Normal, patent appearing tubular esophagus except for a 6 mm slightly       raised nodule in the proximal segment. Please see photos. This appeared       to be simply an inlet patch although it did appear to be slightly raised.      Residual gastric cavity appeared normal. Retroflexion view demonstrated       a hiatal hernia. There is no anastomotic ulceration or stenosis; short       afferret limb. The anastomosis with the efferent limb was widely patent.       Patent. Please see photos. The  efferent small bowel limb appeared  normal. Scope was removed, a 74 [place] Maloney dilator was passed to       full insertion with mild resistance. A look back revealed no apparent       complication related to this maneuver. Finally, the esophageal nodule       was biopsied. Impression:               Esophageal nodule -likely benign. Biopsy. Status                            post [facility] dilation. Prior gastric surgery as                            described. Normal-appearing residual stomach.                            Hiatal hernia. Widely patent efferent limb; Moderate Sedation:      Moderate (conscious) sedation was personally administered by an       anesthesia professional. The following parameters were monitored: oxygen       saturation, heart rate, blood pressure, respiratory rate, EKG, adequacy       of pulmonary ventilation, and response to care. Recommendation:           Patient has a contact number available for                            emergencies. The signs and symptoms of potential                            delayed complications were discussed with the                            patient. Return to normal activities tomorrow.                            Written discharge instructions were provided to the                            patient.                           - Advance diet as tolerated.                           - Continue present medications.                           - Return to my office in 6 weeks. Follow-up on                            pathology. Procedure Code(s):        --- Professional ---                           [phone], Esophagogastroduodenoscopy, flexible,  transoral; diagnostic, including collection of                            specimen(s) by brushing or washing, when performed                            (separate procedure) Diagnosis Code(s):        --- Professional ---                           R13.10, Dysphagia,  unspecified CPT copyright 2022 American Medical Association. All rights reserved. The codes documented in this report are preliminary and upon coder review may  be revised to meet current compliance requirements. Cristopher Estimable. Lawerance Matsuo, MD Norvel Richards, MD 04/07/2022 10:08:50 AM This report has been signed electronically. Number of Addenda: 0

## 2022-04-08 LAB — SURGICAL PATHOLOGY

## 2022-04-10 ENCOUNTER — Encounter: Payer: Self-pay | Admitting: Internal Medicine

## 2022-04-10 NOTE — Progress Notes (Signed)
done

## 2022-04-12 DIAGNOSIS — F4323 Adjustment disorder with mixed anxiety and depressed mood: Secondary | ICD-10-CM | POA: Diagnosis not present

## 2022-04-13 ENCOUNTER — Encounter (HOSPITAL_COMMUNITY): Payer: Self-pay | Admitting: Internal Medicine

## 2022-04-14 DIAGNOSIS — F341 Dysthymic disorder: Secondary | ICD-10-CM | POA: Diagnosis not present

## 2022-04-25 ENCOUNTER — Ambulatory Visit (HOSPITAL_BASED_OUTPATIENT_CLINIC_OR_DEPARTMENT_OTHER): Payer: Federal, State, Local not specified - PPO | Admitting: Obstetrics & Gynecology

## 2022-04-26 DIAGNOSIS — F4323 Adjustment disorder with mixed anxiety and depressed mood: Secondary | ICD-10-CM | POA: Diagnosis not present

## 2022-05-03 DIAGNOSIS — G43719 Chronic migraine without aura, intractable, without status migrainosus: Secondary | ICD-10-CM | POA: Diagnosis not present

## 2022-05-08 NOTE — Progress Notes (Unsigned)
GI Office Note    Referring Provider: Sharilyn Sites, MD Primary Care Physician:  Sharilyn Sites, MD  Primary Gastroenterologist:  Chief Complaint   No chief complaint on file.   History of Present Illness   Melissa Pitts is a 62 y.o. female presenting today      EGD 04/2022: -esophageal nodule, bx showed squamocolumnar junction type mucosa consistent with clinical impression of inlet patch -prior gastric surgery -hiatal hernia -widely patent efferent limb    Medications   Current Outpatient Medications  Medication Sig Dispense Refill   azithromycin (ZITHROMAX) 250 MG tablet Take 250-500 mg by mouth See admin instructions. Take 500 mg on the first day then take 250 mg daily until finished     benzonatate (TESSALON) 200 MG capsule Take 200 mg by mouth 3 (three) times daily as needed for cough.     Cholecalciferol (VITAMIN D) 50 MCG (2000 UT) tablet Take 2,000 Units by mouth daily.     escitalopram (LEXAPRO) 20 MG tablet Take 20 mg by mouth at bedtime.      fluticasone (FLONASE) 50 MCG/ACT nasal spray Place 1 spray into both nostrils 2 (two) times daily. (Patient not taking: Reported on 04/01/2022) 16 g 2   levothyroxine (SYNTHROID, LEVOTHROID) 50 MCG tablet Take 50 mcg by mouth every evening.     ondansetron (ZOFRAN) 4 MG tablet Take 1 tablet (4 mg total) by mouth every 6 (six) hours. (Patient taking differently: Take 4 mg by mouth every 8 (eight) hours as needed for vomiting or nausea.) 12 tablet 0   phentermine 15 MG capsule Take 15 mg by mouth daily.     promethazine (PHENERGAN) 12.5 MG tablet TAKE (1) TABLET BY MOUTH EVERY EIGHT HOURS AS NEEDED FOR NAUSEA OR VOMITING. 30 tablet 0   promethazine-dextromethorphan (PROMETHAZINE-DM) 6.25-15 MG/5ML syrup Take 5 mLs by mouth 4 (four) times daily as needed. (Patient not taking: Reported on 04/01/2022) 100 mL 0   topiramate (TOPAMAX) 50 MG tablet Take 50 mg by mouth daily.     vitamin C (ASCORBIC ACID) 250 MG tablet Take 250  mg by mouth daily.     No current facility-administered medications for this visit.    Allergies   Allergies as of 05/09/2022 - Review Complete 04/07/2022  Allergen Reaction Noted   Nsaids Other (See Comments) 04/26/2018   Sulfamethoxazole Swelling 04/22/2013   Elemental sulfur Rash 04/06/2011   Penicillins Swelling and Rash 04/06/2011     Past Medical History   Past Medical History:  Diagnosis Date   Allergy    Breast mass    Depression    Gallstone    History of diabetes mellitus    history of, had gastric bypass, no longer needs medications   History of kidney stones    Hypertension    history of=before gastric- by-pass   Hypothyroidism    on meds   Migraines    Sleep apnea    no longer wears CPAP=no longer needs since bypass   Thyroid disease    hypothyroid    Past Surgical History   Past Surgical History:  Procedure Laterality Date   BIOPSY  04/07/2022   Procedure: BIOPSY;  Surgeon: Daneil Dolin, MD;  [facility];  Service: Endoscopy;;   bypass leak repair  11/21/12   ESOPHAGOGASTRODUODENOSCOPY (EGD) WITH PROPOFOL N/A 04/07/2022   Procedure: ESOPHAGOGASTRODUODENOSCOPY (EGD) WITH PROPOFOL;  Surgeon: Daneil Dolin, MD;  [facility];  Service: Endoscopy;  Laterality: N/A;  9:15  am   EXTRACORPOREAL SHOCK WAVE LITHOTRIPSY Right 04/26/2018   Procedure: EXTRACORPOREAL SHOCK WAVE LITHOTRIPSY (ESWL);  Surgeon: Cleon Gustin, MD;  Location: [facility];  Service: Urology;  Laterality: Right;   EXTRACORPOREAL SHOCK WAVE LITHOTRIPSY Right 06/11/2018   Procedure: EXTRACORPOREAL SHOCK WAVE LITHOTRIPSY (ESWL);  Surgeon: Cleon Gustin, MD;  Location: [facility];  Service: Urology;  Laterality: Right;   GASTRIC BYPASS  11/14/12   HERNIA REPAIR  5/15   LAPAROSCOPIC APPENDECTOMY N/A 04/09/2020   Procedure: APPENDECTOMY LAPAROSCOPIC;  Surgeon: Virl Cagey, MD;  Location: [facility];  Service: General;  Laterality: N/A;   MALONEY DILATION N/A 04/07/2022    Procedure: Venia Minks DILATION;  Surgeon: Daneil Dolin, MD;  [facility];  Service: Endoscopy;  Laterality: N/A;   VAGINAL HYSTERECTOMY  8/07    Past Family History   Family History  Problem Relation Age of Onset   Hypertension Mother    Breast cancer Mother    Cancer Father    Heart disease Father        bypass   Diabetes Father    Congestive Heart Failure Maternal Aunt    Breast cancer Maternal Grandmother            Diabetes Paternal Grandfather    Colon cancer Neg Hx    Esophageal cancer Neg Hx    Rectal cancer Neg Hx    Stomach cancer Neg Hx    Colon polyps Neg Hx     Past Social History   Social History   Socioeconomic History   Marital status: Married    Spouse name: Not on file   Number of children: Not on file   Years of education: Not on file   Highest education level: Not on file  Occupational History   Not on file  Tobacco Use   Smoking status: Never   Smokeless tobacco: Never  Vaping Use   Vaping Use: Never used  Substance and Sexual Activity   Alcohol use: No   Drug use: No   Sexual activity: Yes    Partners: Male    Birth control/protection: Surgical, None    Comment: TV[facility]  Other Topics Concern   Not on file  Social History Narrative   Not on file   Social Determinants of Health   Financial Resource Strain: Not on file  Food Insecurity: No Food Insecurity (11/08/2021)   Hunger Vital Sign    Worried About Running Out of Food in the Last Year: Never true    Ran Out of Food in the Last Year: Never true  Transportation Needs: No Transportation Needs (11/08/2021)   PRAPARE - Hydrologist (Medical): No    Lack of Transportation (Non-Medical): No  Physical Activity: Not on file  Stress: Not on file  Social Connections: Not on file  Intimate Partner Violence: Not on file    Review of Systems   General: Negative for anorexia, weight loss, fever, chills, fatigue, weakness. ENT: Negative for hoarseness,  difficulty swallowing , nasal congestion. CV: Negative for chest pain, angina, palpitations, dyspnea on exertion, peripheral edema.  Respiratory: Negative for dyspnea at rest, dyspnea on exertion, cough, sputum, wheezing.  GI: See history of present illness. GU:  Negative for dysuria, hematuria, urinary incontinence, urinary frequency, nocturnal urination.  Endo: Negative for unusual weight change.     Physical Exam   LMP 04/04/2005    General: Well-nourished, well-developed in no acute distress.  Eyes: No icterus. Mouth: Oropharyngeal  mucosa moist and pink , no lesions erythema or exudate. Lungs: Clear to auscultation bilaterally.  Heart: Regular rate and rhythm, no murmurs rubs or gallops.  Abdomen: Bowel sounds are normal, nontender, nondistended, no hepatosplenomegaly or masses,  no abdominal bruits or hernia , no rebound or guarding.  Rectal: ***  Extremities: No lower extremity edema. No clubbing or deformities. Neuro: Alert and oriented x 4   Skin: Warm and dry, no jaundice.   Psych: Alert and cooperative, normal mood and affect.  Labs   Lab Results  Component Value Date   LIPASE 29 01/09/2022   Lab Results  Component Value Date   WBC 7.4 01/09/2022   HGB 13.2 01/09/2022   HCT 39.2 01/09/2022   MCV 98.7 01/09/2022   PLT 203 01/09/2022   Lab Results  Component Value Date   ALT 21 01/09/2022   AST 21 01/09/2022   ALKPHOS 83 01/09/2022   BILITOT 0.5 01/09/2022    Imaging Studies   No results found.  Assessment       PLAN   ***   Laureen Ochs. Bobby Rumpf, [place], [facility]

## 2022-05-09 ENCOUNTER — Ambulatory Visit (INDEPENDENT_AMBULATORY_CARE_PROVIDER_SITE_OTHER): Payer: Federal, State, Local not specified - PPO | Admitting: Gastroenterology

## 2022-05-09 ENCOUNTER — Encounter: Payer: Self-pay | Admitting: Gastroenterology

## 2022-05-09 ENCOUNTER — Telehealth: Payer: Self-pay | Admitting: Gastroenterology

## 2022-05-09 VITALS — BP 116/76 | HR 79 | Temp 98.0°F | Ht 63.0 in | Wt 151.8 lb

## 2022-05-09 DIAGNOSIS — R110 Nausea: Secondary | ICD-10-CM

## 2022-05-09 DIAGNOSIS — K222 Esophageal obstruction: Secondary | ICD-10-CM | POA: Diagnosis not present

## 2022-05-09 DIAGNOSIS — Z1211 Encounter for screening for malignant neoplasm of colon: Secondary | ICD-10-CM

## 2022-05-09 NOTE — Patient Instructions (Signed)
Please call when you are ready to schedule your colonoscopy.  Please let me know if you have any recurrent nausea symptoms.   It was a pleasure to see you today. I want to create trusting relationships with patients and provide genuine, compassionate, and quality care. I truly value your feedback, so please be on the lookout for a survey regarding your visit with me today. I appreciate your time in completing this!

## 2022-05-09 NOTE — Telephone Encounter (Signed)
noted 

## 2022-05-09 NOTE — Telephone Encounter (Signed)
Patient seen in office today. She is due screening colonoscopy. She wants to have this done in couple of months. I have told her to call when she is ready. She is appropriate for triage but will need updated medication list when she calls.   She is currently on phentermine and would need to hold for 7 days if she is still on a time of procedure.   Let's plan to mark her as someone who we triage for colonoscopy when she calls.

## 2022-05-20 DIAGNOSIS — E119 Type 2 diabetes mellitus without complications: Secondary | ICD-10-CM | POA: Diagnosis not present

## 2022-05-20 DIAGNOSIS — R7301 Impaired fasting glucose: Secondary | ICD-10-CM | POA: Diagnosis not present

## 2022-05-20 DIAGNOSIS — Z9884 Bariatric surgery status: Secondary | ICD-10-CM | POA: Diagnosis not present

## 2022-05-20 DIAGNOSIS — Z6827 Body mass index (BMI) 27.0-27.9, adult: Secondary | ICD-10-CM | POA: Diagnosis not present

## 2022-05-20 DIAGNOSIS — F341 Dysthymic disorder: Secondary | ICD-10-CM | POA: Diagnosis not present

## 2022-05-20 DIAGNOSIS — E039 Hypothyroidism, unspecified: Secondary | ICD-10-CM | POA: Diagnosis not present

## 2022-05-25 ENCOUNTER — Encounter (HOSPITAL_BASED_OUTPATIENT_CLINIC_OR_DEPARTMENT_OTHER): Payer: Self-pay | Admitting: Obstetrics & Gynecology

## 2022-06-07 DIAGNOSIS — J069 Acute upper respiratory infection, unspecified: Secondary | ICD-10-CM | POA: Diagnosis not present

## 2022-06-27 ENCOUNTER — Other Ambulatory Visit (HOSPITAL_BASED_OUTPATIENT_CLINIC_OR_DEPARTMENT_OTHER): Payer: Self-pay | Admitting: Obstetrics & Gynecology

## 2022-06-27 DIAGNOSIS — Z9884 Bariatric surgery status: Secondary | ICD-10-CM

## 2022-08-22 ENCOUNTER — Ambulatory Visit (INDEPENDENT_AMBULATORY_CARE_PROVIDER_SITE_OTHER): Payer: Federal, State, Local not specified - PPO | Admitting: Obstetrics & Gynecology

## 2022-08-22 ENCOUNTER — Encounter (HOSPITAL_BASED_OUTPATIENT_CLINIC_OR_DEPARTMENT_OTHER): Payer: Self-pay | Admitting: Obstetrics & Gynecology

## 2022-08-22 VITALS — BP 138/78 | HR 72 | Ht 62.75 in | Wt 158.8 lb

## 2022-08-22 DIAGNOSIS — Z9189 Other specified personal risk factors, not elsewhere classified: Secondary | ICD-10-CM | POA: Diagnosis not present

## 2022-08-22 DIAGNOSIS — Z9884 Bariatric surgery status: Secondary | ICD-10-CM

## 2022-08-22 DIAGNOSIS — M8588 Other specified disorders of bone density and structure, other site: Secondary | ICD-10-CM | POA: Diagnosis not present

## 2022-08-22 DIAGNOSIS — Z01419 Encounter for gynecological examination (general) (routine) without abnormal findings: Secondary | ICD-10-CM | POA: Diagnosis not present

## 2022-08-22 MED ORDER — PROMETHAZINE HCL 25 MG PO TABS: 25.0000 mg | ORAL_TABLET | Freq: Four times a day (QID) | ORAL | 1 refills | Status: DC | PRN

## 2022-08-22 NOTE — Progress Notes (Addendum)
62 y.o. [id number] Married White or Caucasian female here for annual exam.  Had some nausea late last year.  Saw bariatric provider.  Had upper GI and then saw Dr. Jena Gauss and had endoscopy.  The nausea has improved.    Had a very stressful year.  She and husband separated in September.  Was having lots of issues with headaches.  After moving out, she hasn't had a migraine since that time.    Denied vaginal bleeding.  Having some issues with vaginal dryness.  Would like some suggestions.  She has increased risk of breast cancer due to family hx and increased risk of breast cancer.  Patient's last menstrual period was 04/04/2005.          Sexually active: No.  The current method of family planning is status post hysterectomy.    Exercising: No.  Discussed 30 minutes of exercise 3 times weekly Smoker:  no  Health Maintenance: Pap:  not indicated History of abnormal Pap:  no MMG:  11/22/2021 Colonoscopy:  04/20/2011, she is going to schedule with Dr. Jena Gauss this year BMD:   11/2019, osteopenia.  Discussed repeating next year. Screening Labs: with Dr. Phillips Odor   reports that she has never smoked. She has never used smokeless tobacco. She reports that she does not drink alcohol and does not use drugs.  Past Medical History:  Diagnosis Date   Allergy    Breast mass    Depression    Gallstone    History of diabetes mellitus    history of, had gastric bypass, no longer needs medications   History of kidney stones    Hypertension    history of=before gastric- by-pass   Hypothyroidism    on meds   Migraines    Sleep apnea    no longer wears CPAP=no longer needs since bypass   Thyroid disease    hypothyroid    Past Surgical History:  Procedure Laterality Date   BIOPSY  04/07/2022   Procedure: BIOPSY;  Surgeon: Corbin Ade, MD;  [facility];  Service: Endoscopy;;   bypass leak repair  11/21/12   ESOPHAGOGASTRODUODENOSCOPY (EGD) WITH PROPOFOL N/A 04/07/2022   Procedure:  ESOPHAGOGASTRODUODENOSCOPY (EGD) WITH PROPOFOL;  Surgeon: Corbin Ade, MD;  [facility];  Service: Endoscopy;  Laterality: N/A;  9:15 am   EXTRACORPOREAL SHOCK WAVE LITHOTRIPSY Right 04/26/2018   Procedure: EXTRACORPOREAL SHOCK WAVE LITHOTRIPSY (ESWL);  Surgeon: Malen Gauze, MD;  Location: [facility];  Service: Urology;  Laterality: Right;   EXTRACORPOREAL SHOCK WAVE LITHOTRIPSY Right 06/11/2018   Procedure: EXTRACORPOREAL SHOCK WAVE LITHOTRIPSY (ESWL);  Surgeon: Malen Gauze, MD;  [facility]: [facility];  Service: Urology;  Laterality: Right;   GASTRIC BYPASS  11/14/12   HERNIA REPAIR  5/15   LAPAROSCOPIC APPENDECTOMY N/A 04/09/2020   Procedure: APPENDECTOMY LAPAROSCOPIC;  Surgeon: Lucretia Roers, MD;  Location: [facility];  Service: General;  Laterality: N/A;   MALONEY DILATION N/A 04/07/2022   Procedure: Elease Hashimoto DILATION;  Surgeon: Corbin Ade, MD;  [facility];  Service: Endoscopy;  Laterality: N/A;   VAGINAL HYSTERECTOMY  8/07    Current Outpatient Medications  Medication Sig Dispense Refill   DULoxetine (CYMBALTA) 60 MG capsule Take 60 mg by mouth daily.     levothyroxine (SYNTHROID, LEVOTHROID) 50 MCG tablet Take 50 mcg by mouth every evening.     phentermine 15 MG capsule Take 15 mg by mouth daily.     topiramate (TOPAMAX) 50 MG tablet  Take 50 mg by mouth daily.     Cholecalciferol (VITAMIN D) 50 MCG (2000 UT) tablet Take 2,000 Units by mouth daily. (Patient not taking: Reported on 08/22/2022)     vitamin C (ASCORBIC ACID) 250 MG tablet Take 250 mg by mouth daily. (Patient not taking: Reported on 08/22/2022)     No current facility-administered medications for this visit.    Family History  Problem Relation Age of Onset   Hypertension Mother    Breast cancer Mother    Cancer Father    Heart disease Father        bypass   Diabetes Father    Congestive Heart Failure Maternal Aunt    Breast cancer Maternal Grandmother            Diabetes Paternal  Grandfather    Colon cancer Neg Hx    Esophageal cancer Neg Hx    Rectal cancer Neg Hx    Stomach cancer Neg Hx    Colon polyps Neg Hx     ROS: Constitutional: negative Genitourinary:negative  Exam:   BP (!) 144/81   Pulse 72   Ht 5' 2.75" (1.594 m)   Wt 158 lb 12.8 oz (72 kg)   LMP 04/04/2005   BMI 28.35 kg/m   Height: 5' 2.75" (159.4 cm)  General appearance: alert, cooperative and appears stated age Head: Normocephalic, without obvious abnormality, atraumatic Neck: no adenopathy, supple, symmetrical, trachea midline and thyroid normal to inspection and palpation Lungs: clear to auscultation bilaterally Breasts: normal appearance, no masses or tenderness Heart: regular rate and rhythm Abdomen: soft, non-tender; bowel sounds normal; no masses,  no organomegaly Extremities: extremities normal, atraumatic, no cyanosis or edema Skin: Skin color, texture, turgor normal. No rashes or lesions Lymph nodes: Cervical, supraclavicular, and axillary nodes normal. No abnormal inguinal nodes palpated Neurologic: Grossly normal   Pelvic: External genitalia:  no lesions              Urethra:  normal appearing urethra with no masses, tenderness or lesions              Bartholins and Skenes: normal                 Vagina: normal appearing vagina with normal color and no discharge, no lesions              Cervix: absent              Pap taken: No. Bimanual Exam:  Uterus:  uterus absent              Adnexa: no mass, fullness, tenderness               Rectovaginal: Confirms               Anus:  normal sphincter tone, no lesions  Chaperone, Raechel Ache, RN, was present for exam.  Assessment/Plan: 1. Well woman exam with routine gynecological exam - Pap smear not indicated - Mammogram 11/2021 - Colonoscopy 2013.  Due and pt is going to have this done with Dr. Jena Gauss - Bone mineral density will be repeated next year - lab work done with PCP, Dr. Phillips Odor - vaccines reviewed/updated  2.  Osteopenia of lumbar spine - will repeat BMD next year  3. S/P gastric bypass - rx for phenergan to pharmacy for pt for intermittent nausea.  4. Increased risk of breast cancer - 22.5% lifetime risk.  Pt had breast MRI in 2023.  Plan to repeat this  year or next year.  She thinks she may want to do this before the end of the year.  5.  Vaginal atrophy - OTC options discussed as well as vagifem and then estrogen cream if needed.  Risks reviewed.

## 2022-08-22 NOTE — Addendum Note (Signed)
Addended by: Jerene Bears on: 08/22/2022 09:35 AM   Modules accepted: Orders

## 2022-09-01 ENCOUNTER — Emergency Department (HOSPITAL_COMMUNITY)
Admission: EM | Admit: 2022-09-01 | Discharge: 2022-09-01 | Disposition: A | Discharge status: Home / Self Care | Payer: Federal, State, Local not specified - PPO | Attending: Emergency Medicine | Admitting: Emergency Medicine

## 2022-09-01 ENCOUNTER — Encounter (HOSPITAL_COMMUNITY): Payer: Self-pay | Admitting: Emergency Medicine

## 2022-09-01 ENCOUNTER — Other Ambulatory Visit: Payer: Self-pay

## 2022-09-01 DIAGNOSIS — R519 Headache, unspecified: Secondary | ICD-10-CM | POA: Diagnosis not present

## 2022-09-01 DIAGNOSIS — S0083XA Contusion of other part of head, initial encounter: Secondary | ICD-10-CM | POA: Diagnosis not present

## 2022-09-01 DIAGNOSIS — W19XXXA Unspecified fall, initial encounter: Secondary | ICD-10-CM

## 2022-09-01 DIAGNOSIS — S40212A Abrasion of left shoulder, initial encounter: Secondary | ICD-10-CM | POA: Insufficient documentation

## 2022-09-01 DIAGNOSIS — W01198A Fall on same level from slipping, tripping and stumbling with subsequent striking against other object, initial encounter: Secondary | ICD-10-CM | POA: Diagnosis not present

## 2022-09-01 DIAGNOSIS — M25512 Pain in left shoulder: Secondary | ICD-10-CM | POA: Diagnosis not present

## 2022-09-01 NOTE — ED Triage Notes (Signed)
Pt fell stepping onto a side walk d/t tripping over the side walk. Denies LOC. Does not take blood thinners. Facial abrasions noted to left forehead, left eye, & lip.

## 2022-09-01 NOTE — Discharge Instructions (Signed)
Clean abrasions twice a day with soap and water gently.  Take Tylenol or Motrin for pain and follow-up with your family doctor if needed

## 2022-09-01 NOTE — ED Provider Notes (Signed)
[facility] AT [facility] Provider Note   CSN: [id number] Arrival date & time: 09/01/22  4098     History  Chief Complaint  Patient presents with   Finnley Canion is a 62 y.o. female.  Patient fell yesterday and hit her face and her left shoulder and head.  Patient had no loss consciousness.  She has no complaints except for soreness to her face  The history is provided by the patient and medical records. No language interpreter was used.  Fall This is a new problem. The problem occurs rarely. The problem has been resolved. Pertinent negatives include no chest pain, no abdominal pain and no headaches. Nothing aggravates the symptoms. Nothing relieves the symptoms. She has tried nothing for the symptoms. The treatment provided no relief.       Home Medications Prior to Admission medications   Medication Sig Start Date End Date Taking? Authorizing Provider  Cholecalciferol (VITAMIN D) 50 MCG (2000 UT) tablet Take 2,000 Units by mouth daily. Patient not taking: Reported on 08/22/2022    [provider]  DULoxetine (CYMBALTA) 60 MG capsule Take 60 mg by mouth daily. 04/20/22   [provider]  levothyroxine (SYNTHROID, LEVOTHROID) 50 MCG tablet Take 50 mcg by mouth every evening. 03/07/11   [provider]  phentermine 15 MG capsule Take 15 mg by mouth daily. 03/17/22   [provider]  promethazine (PHENERGAN) 25 MG tablet Take 1 tablet (25 mg total) by mouth every 6 (six) hours as needed for nausea or vomiting. 08/22/22   Jerene Bears, MD  topiramate (TOPAMAX) 50 MG tablet Take 50 mg by mouth daily. 10/13/21   [provider]  vitamin C (ASCORBIC ACID) 250 MG tablet Take 250 mg by mouth daily. Patient not taking: Reported on 08/22/2022    [provider]      Allergies    Nsaids, Sulfamethoxazole, Elemental sulfur, and Penicillins    Review of Systems   Review of Systems  Constitutional:   Negative for appetite change and fatigue.  HENT:  Negative for congestion, ear discharge and sinus pressure.        Bruising to left cheek and forehead  Eyes:  Negative for discharge.  Respiratory:  Negative for cough.   Cardiovascular:  Negative for chest pain.  Gastrointestinal:  Negative for abdominal pain and diarrhea.  Genitourinary:  Negative for frequency and hematuria.  Musculoskeletal:  Negative for back pain.       Tenderness to left shoulder  Skin:  Negative for rash.  Neurological:  Negative for seizures and headaches.  Psychiatric/Behavioral:  Negative for hallucinations.     Physical Exam Updated Vital Signs BP 133/68   Pulse 78   Temp 98 F (36.7 C) (Oral)   Resp 16   Ht 5\' 3"  (1.6 m)   Wt 63.5 kg   LMP 04/04/2005   SpO2 97%   BMI 24.80 kg/m  Physical Exam Vitals and nursing note reviewed.  Constitutional:      Appearance: She is well-developed.  HENT:     Head: Normocephalic.     Comments: Bruising noticed to left cheek and forehead    Nose: Nose normal.  Eyes:     General: No scleral icterus.    Conjunctiva/sclera: Conjunctivae normal.  Neck:     Thyroid: No thyromegaly.  Cardiovascular:     Rate and Rhythm: Normal rate and regular rhythm.     Heart sounds: No murmur  heard.    No friction rub. No gallop.  Pulmonary:     Breath sounds: No stridor. No wheezing or rales.  Chest:     Chest wall: No tenderness.  Abdominal:     General: There is no distension.     Tenderness: There is no abdominal tenderness. There is no rebound.  Musculoskeletal:        General: Normal range of motion.     Cervical back: Neck supple.     Comments: Abrasion and tenderness left shoulder  Lymphadenopathy:     Cervical: No cervical adenopathy.  Skin:    Findings: No erythema or rash.  Neurological:     Mental Status: She is alert and oriented to person, place, and time.     Motor: No abnormal muscle tone.     Coordination: Coordination normal.  Psychiatric:         Behavior: Behavior normal.     ED Results / Procedures / Treatments   Labs (all labs ordered are listed, but only abnormal results are displayed) Labs Reviewed - No data to display  EKG None  Radiology No results found.  Procedures Procedures    Medications Ordered in ED Medications - No data to display  ED Course/ Medical Decision Making/ A&P                             Medical Decision Making    This patient presents to the ED for concern of fall with, this involves an extensive number of treatment options, and is a complaint that carries with it a high risk of complications and morbidity.  The differential diagnosis includes head injury, shoulder injury   Co morbidities that complicate the patient evaluation  Thyroid disease   Additional history obtained:  Additional history obtained from patient External records from outside source obtained and reviewed including hospital records   Lab Tests:  No labs  Imaging Studies ordered:  No imaging Cardiac Monitoring: / EKG:  The patient was maintained on a cardiac monitor.  I personally viewed and interpreted the cardiac monitored which showed an underlying rhythm of: Normal sinus rhythm   Consultations Obtained:  No consultant  Problem List / ED Course / Critical interventions / Medication management  Thyroid disease, contusion to forehead and left shoulder No medicine given Reevaluation of the patient after these medicines showed that the patient improved I have reviewed the patients home medicines and have made adjustments as needed   Social Determinants of Health:  None   Test / Admission - Considered:  None   Patient with a fall with bruising to face.  Also contusion to left shoulder.  She will take Tylenol and Motrin and follow-up as needed        Final Clinical Impression(s) / ED Diagnoses Final diagnoses:  Fall, initial encounter    Rx / DC Orders ED Discharge Orders      None         Bethann Berkshire, MD 09/04/22 1116

## 2022-09-03 ENCOUNTER — Ambulatory Visit
Admission: RE | Admit: 2022-09-03 | Discharge: 2022-09-03 | Disposition: A | Discharge status: Home / Self Care | Payer: Federal, State, Local not specified - PPO | Source: Ambulatory Visit | Attending: Nurse Practitioner | Admitting: Nurse Practitioner

## 2022-09-03 VITALS — BP 124/79 | HR 92 | Temp 98.0°F | Resp 18 | Wt 145.0 lb

## 2022-09-03 DIAGNOSIS — R21 Rash and other nonspecific skin eruption: Secondary | ICD-10-CM

## 2022-09-03 DIAGNOSIS — S0081XD Abrasion of other part of head, subsequent encounter: Secondary | ICD-10-CM | POA: Diagnosis not present

## 2022-09-03 MED ORDER — TRIAMCINOLONE ACETONIDE 0.1 % EX OINT: 1.0000 | TOPICAL_OINTMENT | Freq: Two times a day (BID) | CUTANEOUS | 0 refills | Status: DC

## 2022-09-03 MED ORDER — DEXAMETHASONE SODIUM PHOSPHATE 10 MG/ML IJ SOLN
10.0000 mg | Freq: Once | INTRAMUSCULAR | Status: AC
  Administered 2022-09-03: 10 mg via INTRAMUSCULAR

## 2022-09-03 NOTE — ED Triage Notes (Signed)
Pt states that she developed a rash on her stomach,back inner thighs, back and arms. Started Thursday. She took benadryl  last night.

## 2022-09-03 NOTE — ED Provider Notes (Signed)
[facility]    CSN: [id number] Arrival date & time: 09/03/22  1348      History   Chief Complaint Chief Complaint  Patient presents with   Rash    Entered by patient    HPI Melissa Pitts is a 62 y.o. female.   Patient presents today with 3-day history of itchy, red, and raised rash to chest, bilateral arms, back, and groin.  Denies recent change in soaps, detergents, or personal care products.  No shortness of breath, throat or tongue swelling, or new muscle pain/joint aches.  Reports she has outdoor floors, but has not been in the woods recently.  No known exposures to plant ivys.  Reports her dog has been going outside at her mom's as she is helping care for her mom who recently had a knee replacement.  Reports her mom's house has been noted to have possible poison oak.  Has been taking Benadryl without improvement in symptoms.  Rash seems to be worsening.  Patient is also concerned about abrasions to her face that she sustained yesterday.  Reports she was at her mom's house getting something out of the car when she took a misstep and fell onto the left side of her face.  She denies loss of consciousness and she was initially seen in the emergency room.  She is wondering what she can put over the abrasions and if she needs to keep them covered with bandages.     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   Migraines 04/09/2021   Other allergic rhinitis 02/17/2021   Frequent episodes of sinusitis 02/17/2021   Osteopenia 08/17/2020   Increased risk of breast cancer 08/17/2020   Family history of breast cancer 08/17/2020   Hypothyroidism (acquired) 04/18/2018   Hypomagnesemia 01/01/2013   SIRS (systemic inflammatory response syndrome) (HCC) 12/24/2012   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   Bariatric surgery status 11/21/2012   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   Clinical depression 06/25/2012    Past Surgical History:  Procedure Laterality Date   BIOPSY  04/07/2022   Procedure: BIOPSY;  Surgeon: Corbin Ade, MD;  Location: [facility];  Service: Endoscopy;;   bypass leak repair  11/21/12   ESOPHAGOGASTRODUODENOSCOPY (EGD) WITH PROPOFOL N/A 04/07/2022   Procedure: ESOPHAGOGASTRODUODENOSCOPY (EGD) WITH PROPOFOL;  Surgeon: Corbin Ade, MD;  Location: [facility];  Service: Endoscopy;  Laterality: N/A;  9:15 am   EXTRACORPOREAL SHOCK WAVE LITHOTRIPSY Right 04/26/2018   Procedure: EXTRACORPOREAL SHOCK WAVE LITHOTRIPSY (ESWL);  Surgeon: Malen Gauze, MD;  Location: [facility];  Service: Urology;  Laterality: Right;   EXTRACORPOREAL SHOCK WAVE LITHOTRIPSY Right 06/11/2018   Procedure: EXTRACORPOREAL SHOCK WAVE LITHOTRIPSY (ESWL);  Surgeon: Malen Gauze, MD;  Location: [facility];  Service: Urology;  Laterality:  Right;   GASTRIC BYPASS  11/14/12   HERNIA REPAIR  5/15   LAPAROSCOPIC APPENDECTOMY N/A 04/09/2020   Procedure: APPENDECTOMY LAPAROSCOPIC;  Surgeon: Lucretia Roers, MD;  Location: [facility];  Service: General;  Laterality: N/A;   MALONEY DILATION N/A 04/07/2022   Procedure: Elease Hashimoto DILATION;  Surgeon: Corbin Ade, MD;  Location: [facility];  Service: Endoscopy;  Laterality: N/A;   VAGINAL HYSTERECTOMY  8/07    OB History     Gravida  2   Para  1   Term      Preterm      AB  1   Living  1      SAB  1   IAB      Ectopic       Multiple      Live Births               Home Medications    Prior to Admission medications   Medication Sig Start Date End Date Taking? Authorizing Provider  Cholecalciferol (VITAMIN D) 50 MCG (2000 UT) tablet Take 2,000 Units by mouth daily.   Yes [provider]  DULoxetine (CYMBALTA) 60 MG capsule Take 60 mg by mouth daily. 04/20/22  Yes [provider]  levothyroxine (SYNTHROID, LEVOTHROID) 50 MCG tablet Take 50 mcg by mouth every evening. 03/07/11  Yes [provider]  phentermine 15 MG capsule Take 15 mg by mouth daily. 03/17/22  Yes [provider]  promethazine (PHENERGAN) 25 MG tablet Take 1 tablet (25 mg total) by mouth every 6 (six) hours as needed for nausea or vomiting. 08/22/22  Yes Jerene Bears, MD  topiramate (TOPAMAX) 50 MG tablet Take 50 mg by mouth daily. 10/13/21  Yes [provider]  triamcinolone ointment (KENALOG) 0.1 % Apply 1 Application topically 2 (two) times daily. Apply sparingly twice daily to affected areas.  Do not use for more than 2 weeks consecutively. 09/03/22  Yes Valentino Nose, NP  vitamin C (ASCORBIC ACID) 250 MG tablet Take 250 mg by mouth daily.   Yes [provider]    Family History Family History  Problem Relation Age of Onset   Hypertension Mother    Breast cancer Mother    Cancer Father    Heart disease Father        bypass   Diabetes Father    Congestive Heart Failure Maternal Aunt    Breast cancer Maternal Grandmother            Diabetes Paternal Grandfather    Colon cancer Neg Hx    Esophageal cancer Neg Hx    Rectal cancer Neg Hx    Stomach cancer Neg Hx    Colon polyps Neg Hx     Social History Social History   Tobacco Use   Smoking status: Never   Smokeless tobacco: Never  Vaping Use   Vaping Use: Never used  Substance Use Topics   Alcohol use: No   Drug use: No     Allergies   Nsaids, Sulfamethoxazole, Elemental sulfur, and  Penicillins   Review of Systems Review of Systems Per HPI  Physical Exam Triage Vital Signs ED Triage Vitals  Enc Vitals Group     BP 09/03/22 1355 124/79     Pulse Rate 09/03/22 1355 92     Resp 09/03/22 1355 18     Temp 09/03/22 1355 98 F (36.7 C)     Temp Source 09/03/22 1355 Oral  SpO2 09/03/22 1355 96 %     Weight 09/03/22 1354 145 lb (65.8 kg)     Height --      Head Circumference --      Peak Flow --      Pain Score 09/03/22 1354 0     Pain Loc --      Pain Edu? --      Excl. in GC? --    No data found.  Updated Vital Signs BP 124/79 (BP Location: Right Arm)   Pulse 92   Temp 98 F (36.7 C) (Oral)   Resp 18   Wt 145 lb (65.8 kg)   LMP 04/04/2005   SpO2 96%   BMI 25.69 kg/m   Visual Acuity Right Eye Distance:   Left Eye Distance:   Bilateral Distance:    Right Eye Near:   Left Eye Near:    Bilateral Near:     Physical Exam Vitals and nursing note reviewed.  Constitutional:      General: She is not in acute distress.    Appearance: Normal appearance. She is not toxic-appearing.  HENT:     Mouth/Throat:     Mouth: Mucous membranes are moist.     Pharynx: Oropharynx is clear.  Pulmonary:     Effort: Pulmonary effort is normal. No respiratory distress.  Skin:    General: Skin is warm and dry.     Capillary Refill: Capillary refill takes less than 2 seconds.     Coloration: Skin is not jaundiced or pale.     Findings: Erythema and rash present.     Comments: Linear, slightly erythematous and raised papular rash to bilateral groins, chest, back.  Abrasions noted to left side of face above the eyebrow and on the chin.  No surrounding erythema, active drainage, warmth.  Neurological:     Mental Status: She is alert and oriented to person, place, and time.  Psychiatric:        Behavior: Behavior is cooperative.      UC Treatments / Results  Labs (all labs ordered are listed, but only abnormal results are displayed) Labs Reviewed - No data  to display  EKG   Radiology No results found.  Procedures Procedures (including critical care time)  Medications Ordered in UC Medications  dexamethasone (DECADRON) injection 10 mg (10 mg Intramuscular Given 09/03/22 1447)    Initial Impression / Assessment and Plan / UC Course  I have reviewed the triage vital signs and the nursing notes.  Pertinent labs & imaging results that were available during my care of the patient were reviewed by me and considered in my medical decision making (see chart for details).   Patient is well-appearing, normotensive, afebrile, not tachycardic, not tachypneic, oxygenating well on room air.    Rash and nonspecific skin eruption ***  Abrasion of face, subsequent encounter ***   The patient was given the opportunity to ask questions.  All questions answered to their satisfaction.  The patient is in agreement to this plan.    Final Clinical Impressions(s) / UC Diagnoses   Final diagnoses:  Rash and nonspecific skin eruption  Abrasion of face, subsequent encounter     Discharge Instructions      Recommend leaving the facial abrasions open to air since they are healing.  You can apply a little bit of Vaseline or Neosporin twice daily to help them stay moist.  Do not need to cover these areas.   For the rash: this looks  like poison ivy, sumac, or oak.  We have given you a steroid shot today to help with inflammation and itching.  Start the Kenalog ointment applied to the itchy areas up to twice daily as needed for itching.  Also start an oral antihistamine like Zyrtec or Allegra during the day.  Continue Benadryl at night time as needed for itching.    Seek care if symptoms persist or worsen despite treatment.    ED Prescriptions     Medication Sig Dispense Auth. Provider   triamcinolone ointment (KENALOG) 0.1 % Apply 1 Application topically 2 (two) times daily. Apply sparingly twice daily to affected areas.  Do not use for more than 2  weeks consecutively. 15 g Valentino Nose, NP      PDMP not reviewed this encounter.

## 2022-09-03 NOTE — Discharge Instructions (Addendum)
Recommend leaving the facial abrasions open to air since they are healing.  You can apply a little bit of Vaseline or Neosporin twice daily to help them stay moist.  Do not need to cover these areas.   For the rash: this looks like poison ivy, sumac, or oak.  We have given you a steroid shot today to help with inflammation and itching.  Start the Kenalog ointment applied to the itchy areas up to twice daily as needed for itching.  Also start an oral antihistamine like Zyrtec or Allegra during the day.  Continue Benadryl at night time as needed for itching.    Seek care if symptoms persist or worsen despite treatment.

## 2022-09-09 DIAGNOSIS — E663 Overweight: Secondary | ICD-10-CM | POA: Diagnosis not present

## 2022-09-09 DIAGNOSIS — Z6827 Body mass index (BMI) 27.0-27.9, adult: Secondary | ICD-10-CM | POA: Diagnosis not present

## 2022-09-09 DIAGNOSIS — L237 Allergic contact dermatitis due to plants, except food: Secondary | ICD-10-CM | POA: Diagnosis not present

## 2022-09-09 DIAGNOSIS — Z9884 Bariatric surgery status: Secondary | ICD-10-CM | POA: Diagnosis not present

## 2022-09-22 NOTE — Telephone Encounter (Signed)
Pt called back and she is ready to schedule her colonoscopy

## 2022-09-22 NOTE — Telephone Encounter (Signed)
Questionnaire sent.

## 2022-10-17 ENCOUNTER — Telehealth: Payer: Self-pay | Admitting: *Deleted

## 2022-10-17 NOTE — Telephone Encounter (Signed)
LMTRC  Need to know what medications pt is taking and verify allergies. None listed on colonoscopy questionnaire.

## 2022-10-18 NOTE — Telephone Encounter (Signed)
Spoke with pt and she verified medications and allergies.

## 2022-10-18 NOTE — Telephone Encounter (Signed)
Procedure: colonoscopy   Height: 5'3" Weight: 153 lb      BMI: 27.1  Have you had a colonoscopy before?  Yes, 2013, Dr.Brodie  Do you have family history of colon cancer?  no  Do you have a family history of polyps? no  Previous colonoscopy with polyps removed? no  Do you have a history colorectal cancer?   no  Are you diabetic?  nno  Do you have a prosthetic or mechanical heart valve? no  Do you have a pacemaker/defibrillator?   no  Have you had endocarditis/atrial fibrillation?  no  Do you use supplemental oxygen/CPAP?  no  Have you had joint replacement within the last 12 months?  no  Do you tend to be constipated or have to use laxatives?  no   Do you have history of alcohol use? If yes, how much and how often.  no  Do you have history or are you using drugs? If yes, what do are you  using?  no  Have you ever had a stroke/heart attack?  no  Have you ever had a heart or other vascular stent placed,?no  Do you take weight loss medication? yes  female patients,: have you had a hysterectomy? yes                              are you post menopausal?  yes                              do you still have your menstrual cycle? No    Date of last menstrual period? unsure  Do you take any blood-thinning medications such as: (Plavix, aspirin, Coumadin, Aggrenox, Brilinta, Xarelto, Eliquis, Pradaxa, Savaysa or Effient)? no  If yes we need the name, milligram, dosage and who is prescribing doctor:  n/a             Current Outpatient Medications  Medication Sig Dispense Refill   Cholecalciferol (VITAMIN D) 50 MCG (2000 UT) tablet Take 2,000 Units by mouth daily.     DULoxetine (CYMBALTA) 60 MG capsule Take 60 mg by mouth daily.     levothyroxine (SYNTHROID, LEVOTHROID) 50 MCG tablet Take 50 mcg by mouth every evening.     phentermine 15 MG capsule Take 15 mg by mouth daily.     promethazine (PHENERGAN) 25 MG tablet Take 1 tablet (25 mg total) by mouth every 6 (six) hours as  needed for nausea or vomiting. 30 tablet 1   topiramate (TOPAMAX) 50 MG tablet Take 50 mg by mouth daily.     vitamin C (ASCORBIC ACID) 250 MG tablet Take 250 mg by mouth daily.     No current facility-administered medications for this visit.    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 No Did it involve sudden or severe rash/hives, skin peeling, or any reaction on the inside of your mouth or nose? Yes Did you need to seek medical attention at a hospital or doctor's office? No When did it last happen? Over 10 years      If all above answers are "NO", may proceed with cephalosporin use.

## 2022-10-18 NOTE — Addendum Note (Signed)
Addended by: Elinor Dodge on: 10/18/2022 12:49 PM   Modules accepted: Orders

## 2022-12-14 NOTE — Telephone Encounter (Signed)
Ok to schedule ASA 2, hold Phentermine x7 days

## 2022-12-14 NOTE — Telephone Encounter (Signed)
Noted, will call pt once we receive future schedule

## 2022-12-15 ENCOUNTER — Encounter (HOSPITAL_BASED_OUTPATIENT_CLINIC_OR_DEPARTMENT_OTHER): Payer: Self-pay | Admitting: Obstetrics & Gynecology

## 2022-12-20 MED ORDER — PEG 3350-KCL-NA BICARB-NACL 420 G PO SOLR: 4000.0000 mL | Freq: Once | ORAL | 0 refills | Status: AC

## 2022-12-20 NOTE — Telephone Encounter (Signed)
Spoke with pt. She has been scheduled for 10/21. Aware will send instructions to her. She is scheduled with Dr. Jena Gauss. Rx for prep sent to pharmacy.

## 2022-12-20 NOTE — Addendum Note (Signed)
Addended by: Armstead Peaks on: 12/20/2022 02:30 PM   Modules accepted: Orders

## 2022-12-21 DIAGNOSIS — Z1231 Encounter for screening mammogram for malignant neoplasm of breast: Secondary | ICD-10-CM | POA: Diagnosis not present

## 2022-12-21 NOTE — Telephone Encounter (Signed)
No referral for patient.

## 2022-12-30 NOTE — Telephone Encounter (Signed)
DPR reviewed. LMOVM for pt to call office and let us know if she wants to proceed with breast MRI

## 2023-01-05 ENCOUNTER — Encounter (HOSPITAL_BASED_OUTPATIENT_CLINIC_OR_DEPARTMENT_OTHER): Payer: Self-pay | Admitting: *Deleted

## 2023-01-05 DIAGNOSIS — Z6829 Body mass index (BMI) 29.0-29.9, adult: Secondary | ICD-10-CM | POA: Diagnosis not present

## 2023-01-05 DIAGNOSIS — J0110 Acute frontal sinusitis, unspecified: Secondary | ICD-10-CM | POA: Diagnosis not present

## 2023-01-05 DIAGNOSIS — E663 Overweight: Secondary | ICD-10-CM | POA: Diagnosis not present

## 2023-01-16 DIAGNOSIS — Z1331 Encounter for screening for depression: Secondary | ICD-10-CM | POA: Diagnosis not present

## 2023-01-16 DIAGNOSIS — Z6829 Body mass index (BMI) 29.0-29.9, adult: Secondary | ICD-10-CM | POA: Diagnosis not present

## 2023-01-16 DIAGNOSIS — E119 Type 2 diabetes mellitus without complications: Secondary | ICD-10-CM | POA: Diagnosis not present

## 2023-01-16 DIAGNOSIS — Z9884 Bariatric surgery status: Secondary | ICD-10-CM | POA: Diagnosis not present

## 2023-01-16 DIAGNOSIS — R7309 Other abnormal glucose: Secondary | ICD-10-CM | POA: Diagnosis not present

## 2023-01-16 DIAGNOSIS — Z0001 Encounter for general adult medical examination with abnormal findings: Secondary | ICD-10-CM | POA: Diagnosis not present

## 2023-01-23 ENCOUNTER — Ambulatory Visit (HOSPITAL_COMMUNITY): Payer: Federal, State, Local not specified - PPO | Admitting: Anesthesiology

## 2023-01-23 ENCOUNTER — Other Ambulatory Visit: Payer: Self-pay

## 2023-01-23 ENCOUNTER — Encounter (HOSPITAL_COMMUNITY)
Admission: RE | Disposition: A | Discharge status: Home / Self Care | Payer: Self-pay | Source: Home / Self Care | Attending: Internal Medicine

## 2023-01-23 ENCOUNTER — Encounter (HOSPITAL_COMMUNITY): Payer: Self-pay | Admitting: Internal Medicine

## 2023-01-23 ENCOUNTER — Ambulatory Visit (HOSPITAL_COMMUNITY)
Admission: RE | Admit: 2023-01-23 | Discharge: 2023-01-23 | Disposition: A | Discharge status: Home / Self Care | Payer: Federal, State, Local not specified - PPO | Attending: Internal Medicine | Admitting: Internal Medicine

## 2023-01-23 DIAGNOSIS — F32A Depression, unspecified: Secondary | ICD-10-CM | POA: Insufficient documentation

## 2023-01-23 DIAGNOSIS — E039 Hypothyroidism, unspecified: Secondary | ICD-10-CM | POA: Insufficient documentation

## 2023-01-23 DIAGNOSIS — Z1211 Encounter for screening for malignant neoplasm of colon: Secondary | ICD-10-CM | POA: Insufficient documentation

## 2023-01-23 DIAGNOSIS — I10 Essential (primary) hypertension: Secondary | ICD-10-CM | POA: Diagnosis not present

## 2023-01-23 DIAGNOSIS — Z833 Family history of diabetes mellitus: Secondary | ICD-10-CM | POA: Insufficient documentation

## 2023-01-23 DIAGNOSIS — R519 Headache, unspecified: Secondary | ICD-10-CM | POA: Insufficient documentation

## 2023-01-23 DIAGNOSIS — Z9884 Bariatric surgery status: Secondary | ICD-10-CM | POA: Diagnosis not present

## 2023-01-23 DIAGNOSIS — G4730 Sleep apnea, unspecified: Secondary | ICD-10-CM | POA: Diagnosis not present

## 2023-01-23 DIAGNOSIS — Z79890 Hormone replacement therapy (postmenopausal): Secondary | ICD-10-CM | POA: Insufficient documentation

## 2023-01-23 DIAGNOSIS — E119 Type 2 diabetes mellitus without complications: Secondary | ICD-10-CM | POA: Insufficient documentation

## 2023-01-23 HISTORY — PX: COLONOSCOPY WITH PROPOFOL: SHX5780

## 2023-01-23 SURGERY — COLONOSCOPY WITH PROPOFOL
Anesthesia: General

## 2023-01-23 MED ORDER — PROPOFOL 500 MG/50ML IV EMUL
INTRAVENOUS | Status: DC | PRN
  Administered 2023-01-23: 150 ug/kg/min via INTRAVENOUS

## 2023-01-23 MED ORDER — PROPOFOL 10 MG/ML IV BOLUS
INTRAVENOUS | Status: DC | PRN
  Administered 2023-01-23: 20 mg via INTRAVENOUS
  Administered 2023-01-23: 10 mg via INTRAVENOUS
  Administered 2023-01-23: 20 mg via INTRAVENOUS

## 2023-01-23 MED ORDER — LACTATED RINGERS IV SOLN: INTRAVENOUS | Status: DC | PRN

## 2023-01-23 MED ORDER — LIDOCAINE HCL (CARDIAC) PF 100 MG/5ML IV SOSY
PREFILLED_SYRINGE | INTRAVENOUS | Status: DC | PRN
  Administered 2023-01-23: 100 mg via INTRAVENOUS

## 2023-01-23 MED ORDER — STERILE WATER FOR IRRIGATION IR SOLN
Status: DC | PRN
  Administered 2023-01-23: 180 mL

## 2023-01-23 NOTE — Anesthesia Postprocedure Evaluation (Signed)
Anesthesia Post Note  Patient: Melissa Pitts  Procedure(s) Performed: COLONOSCOPY WITH PROPOFOL  Patient location during evaluation: PACU Anesthesia Type: General Level of consciousness: awake and alert Pain management: pain level controlled Vital Signs Assessment: post-procedure vital signs reviewed and stable Respiratory status: spontaneous breathing, nonlabored ventilation, respiratory function stable and patient connected to nasal cannula oxygen Cardiovascular status: blood pressure returned to baseline and stable Postop Assessment: no apparent nausea or vomiting Anesthetic complications: no   There were no known notable events for this encounter.   Last Vitals:  Vitals:   01/23/23 0853 01/23/23 0857  BP:  (!) 114/55  Pulse: 75 75  Resp: 17 20  Temp: 36.5 C   SpO2: 96% 97%    Last Pain:  Vitals:   01/23/23 0857  TempSrc:   PainSc: 0-No pain                 Jaeleigh Monaco L [place]

## 2023-01-23 NOTE — Op Note (Signed)
[facility] Patient Name: Melissa Pitts Procedure Date: 01/23/2023 8:01 AM MRN: [id number] Date of Birth: [birth year] Attending MD: Gennette Pac , MD, [id number] CSN: [id number] Age: 62 Admit Type: Outpatient Procedure:                Colonoscopy Indications:              Screening for colorectal malignant neoplasm Providers:                Gennette Pac, MD, Edrick Kins, RN,                            Zena Amos Referring MD:              Medicines:                Propofol per Anesthesia Complications:            No immediate complications. Estimated Blood Loss:     Estimated blood loss: none. Procedure:                Pre-Anesthesia Assessment:                           - Prior to the procedure, a History and Physical                            was performed, and patient medications and                            allergies were reviewed. The patient's tolerance of                            previous anesthesia was also reviewed. The risks                            and benefits of the procedure and the sedation                            options and risks were discussed with the patient.                            All questions were answered, and informed consent                            was obtained. Prior Anticoagulants: The patient has                            taken no anticoagulant or antiplatelet agents. ASA                            Grade Assessment: III - A patient with severe                            systemic disease. After reviewing the risks and  benefits, the patient was deemed in satisfactory                            condition to undergo the procedure.                           After obtaining informed consent, the colonoscope                            was passed under direct vision. Throughout the                            procedure, the patient's blood pressure, pulse, and                            oxygen  saturations were monitored continuously. The                            [id number]) scope was introduced through the                            anus and advanced to the the cecum, identified by                            appendiceal orifice and ileocecal valve. The                            colonoscopy was performed without difficulty. The                            patient tolerated the procedure well. The quality                            of the bowel preparation was adequate. The entire                            colon was well visualized. Scope In: 8:28:16 AM Scope Out: 8:50:15 AM Scope Withdrawal Time: 0 hours 7 minutes 28 seconds  Total Procedure Duration: 0 hours 21 minutes 59 seconds  Findings:      The perianal and digital rectal examinations were normal.      The colon (entire examined portion) appeared normal.      The exam was otherwise without abnormality on direct and retroflexion       views. Impression:               - The entire examined colon is normal.                           - The examination was otherwise normal on direct                            and retroflexion views.                           - No specimens collected.  Moderate Sedation:      Moderate (conscious) sedation was personally administered by an       anesthesia professional. The following parameters were monitored: oxygen       saturation, heart rate, blood pressure, respiratory rate, EKG, adequacy       of pulmonary ventilation, and response to care. Recommendation:           - Patient has a contact number available for                            emergencies. The signs and symptoms of potential                            delayed complications were discussed with the                            patient. Return to normal activities tomorrow.                            Written discharge instructions were provided to the                            patient.                           - Advance  diet as tolerated.                           - Continue present medications.                           - Repeat colonoscopy in 10 years for screening                            purposes.                           - Return to GI office (date not yet determined). Procedure Code(s):        --- Professional ---                           [phone], Colonoscopy, flexible; diagnostic, including                            collection of specimen(s) by brushing or washing,                            when performed (separate procedure) Diagnosis Code(s):        --- Professional ---                           Z12.11, Encounter for screening for malignant                            neoplasm of colon CPT copyright 2022 [employer]. All rights reserved. The codes documented in this report are preliminary and upon coder review  may  be revised to meet current compliance requirements. Gerrit Friends. Amaad Byers, MD Gennette Pac, MD 01/23/2023 8:58:41 AM This report has been signed electronically. Number of Addenda: 0

## 2023-01-23 NOTE — Discharge Instructions (Addendum)
  Colonoscopy Discharge Instructions  Read the instructions outlined below and refer to this sheet in the next few weeks. These discharge instructions provide you with general information on caring for yourself after you leave the hospital. Your doctor may also give you specific instructions. While your treatment has been planned according to the most current medical practices available, unavoidable complications occasionally occur. If you have any problems or questions after discharge, call Dr. Jena Gauss at [phone]. ACTIVITY You may resume your regular activity, but move at a slower pace for the next 24 hours.  Take frequent rest periods for the next 24 hours.  Walking will help get rid of the air and reduce the bloated feeling in your belly (abdomen).  No driving for 24 hours (because of the medicine (anesthesia) used during the test).   Do not sign any important legal documents or operate any machinery for 24 hours (because of the anesthesia used during the test).  NUTRITION Drink plenty of fluids.  You may resume your normal diet as instructed by your doctor.  Begin with a light meal and progress to your normal diet. Heavy or fried foods are harder to digest and may make you feel sick to your stomach (nauseated).  Avoid alcoholic beverages for 24 hours or as instructed.  MEDICATIONS You may resume your normal medications unless your doctor tells you otherwise.  WHAT YOU CAN EXPECT TODAY Some feelings of bloating in the abdomen.  Passage of more gas than usual.  Spotting of blood in your stool or on the toilet paper.  IF YOU HAD POLYPS REMOVED DURING THE COLONOSCOPY: No aspirin products for 7 days or as instructed.  No alcohol for 7 days or as instructed.  Eat a soft diet for the next 24 hours.  FINDING OUT THE RESULTS OF YOUR TEST Not all test results are available during your visit. If your test results are not back during the visit, make an appointment with your caregiver to find out the  results. Do not assume everything is normal if you have not heard from your caregiver or the medical facility. It is important for you to follow up on all of your test results.  SEEK IMMEDIATE MEDICAL ATTENTION IF: You have more than a spotting of blood in your stool.  Your belly is swollen (abdominal distention).  You are nauseated or vomiting.  You have a temperature over 101.  You have abdominal pain or discomfort that is severe or gets worse throughout the day.    Although your colon was somewhat redundant and elongated which made the exam more challenging, it was otherwise normal.  No polyps.  You should return for 1 more screening colonoscopy in 10 years.  At patient request, I called [facility] at [phone] findings and recommendations

## 2023-01-23 NOTE — Transfer of Care (Signed)
Immediate Anesthesia Transfer of Care Note  Patient: Melissa Pitts  Procedure(s) Performed: COLONOSCOPY WITH PROPOFOL  Patient Location: Short Stay  Anesthesia Type:General  Level of Consciousness: awake, alert , oriented, and patient cooperative  Airway & Oxygen Therapy: Patient Spontanous Breathing  Post-op Assessment: Report given to RN, Post -op Vital signs reviewed and stable, and Patient moving all extremities X 4  Post vital signs: Reviewed and stable  Last Vitals:  Vitals Value Taken Time  BP 114/55 01/23/23 0857  Temp 36.5 C 01/23/23 0853  Pulse 75 01/23/23 0857  Resp 20 01/23/23 0857  SpO2 97 % 01/23/23 0857    Last Pain:  Vitals:   01/23/23 0857  TempSrc:   PainSc: 0-No pain      Patients Stated Pain Goal: 10 (01/23/23 0711)  Complications: No notable events documented.

## 2023-01-23 NOTE — Anesthesia Preprocedure Evaluation (Addendum)
Anesthesia Evaluation  Patient identified by MRN, date of birth, ID band Patient awake    Reviewed: Allergy & Precautions, H&P , NPO status , Patient's Chart, lab work & pertinent test results  Airway Mallampati: II  TM Distance: >3 FB Neck ROM: Full   Comment: Small mouth  Dental no notable dental hx. (+) Dental Advisory Given   Pulmonary sleep apnea    Pulmonary exam normal breath sounds clear to auscultation       Cardiovascular hypertension, Normal cardiovascular exam Rhythm:Regular Rate:Normal     Neuro/Psych  Headaches   Depression       GI/Hepatic negative GI ROS, Neg liver ROS,,,  Endo/Other  diabetes, Type 2Hypothyroidism  Lost weight after gastric bypass so probably not diabetic now  Renal/GU negative Renal ROS  negative genitourinary   Musculoskeletal negative musculoskeletal ROS (+)    Abdominal   Peds negative pediatric ROS (+)  Hematology negative hematology ROS (+)   Anesthesia Other Findings   Reproductive/Obstetrics negative OB ROS                             Anesthesia Physical Anesthesia Plan  ASA: 3  Anesthesia Plan: General   Post-op Pain Management: Minimal or no pain anticipated   Induction: Intravenous  PONV Risk Score and Plan: Propofol infusion  Airway Management Planned: Natural Airway and Nasal Cannula  Additional Equipment:   Intra-op Plan:   Post-operative Plan:   Informed Consent: I have reviewed the patients History and Physical, chart, labs and discussed the procedure including the risks, benefits and alternatives for the proposed anesthesia with the patient or authorized representative who has indicated his/her understanding and acceptance.     Dental advisory given  Plan Discussed with: CRNA  Anesthesia Plan Comments:        Anesthesia Quick Evaluation

## 2023-01-23 NOTE — H&P (Signed)
@LOGO @   Primary Care Physician:  Assunta Found, MD Primary Gastroenterologist:  Dr. Jena Gauss  Pre-Procedure History & Physical: HPI:  Melissa Pitts is a 62 y.o. female here for screening colonoscopy.  Average risk.  Incomplete colonoscopy Dr. Dickie La 2013.  Incomplete for "technical reasons".  Subsequent barium enema negative.  Past Medical History:  Diagnosis Date   Allergy    Breast mass    Depression    Gallstone    History of diabetes mellitus    history of, had gastric bypass, no longer needs medications   History of kidney stones    Hypothyroidism    on meds   Migraines    Thyroid disease    hypothyroid    Past Surgical History:  Procedure Laterality Date   BIOPSY  04/07/2022   Procedure: BIOPSY;  Surgeon: Corbin Ade, MD;  Location: [facility];  Service: Endoscopy;;   bypass leak repair  11/21/12   ESOPHAGOGASTRODUODENOSCOPY (EGD) WITH PROPOFOL N/A 04/07/2022   Procedure: ESOPHAGOGASTRODUODENOSCOPY (EGD) WITH PROPOFOL;  Surgeon: Corbin Ade, MD;  Location: [facility];  Service: Endoscopy;  Laterality: N/A;  9:15 am   EXTRACORPOREAL SHOCK WAVE LITHOTRIPSY Right 04/26/2018   Procedure: EXTRACORPOREAL SHOCK WAVE LITHOTRIPSY (ESWL);  Surgeon: Malen Gauze, MD;  Location: [facility];  Service: Urology;  Laterality: Right;   EXTRACORPOREAL SHOCK WAVE LITHOTRIPSY Right 06/11/2018   Procedure: EXTRACORPOREAL SHOCK WAVE LITHOTRIPSY (ESWL);  Surgeon: Malen Gauze, MD;  Location: [facility];  Service: Urology;  Laterality: Right;   GASTRIC BYPASS  11/14/12   HERNIA REPAIR  5/15   LAPAROSCOPIC APPENDECTOMY N/A 04/09/2020   Procedure: APPENDECTOMY LAPAROSCOPIC;  Surgeon: Lucretia Roers, MD;  Location: [facility];  Service: General;  Laterality: N/A;   MALONEY DILATION N/A 04/07/2022   Procedure: Elease Hashimoto DILATION;  Surgeon: Corbin Ade, MD;  Location: [facility];  Service: Endoscopy;  Laterality: N/A;   VAGINAL HYSTERECTOMY  8/07    Prior to Admission medications    Medication Sig Start Date End Date Taking? Authorizing Provider  Cholecalciferol (VITAMIN D) 50 MCG (2000 UT) tablet Take 2,000 Units by mouth daily.    [provider]  DULoxetine (CYMBALTA) 60 MG capsule Take 60 mg by mouth daily. 04/20/22   [provider]  levothyroxine (SYNTHROID, LEVOTHROID) 50 MCG tablet Take 50 mcg by mouth every evening. 03/07/11   [provider]  phentermine 15 MG capsule Take 15 mg by mouth daily. 03/17/22   [provider]  promethazine (PHENERGAN) 25 MG tablet Take 1 tablet (25 mg total) by mouth every 6 (six) hours as needed for nausea or vomiting. 08/22/22   Jerene Bears, MD  topiramate (TOPAMAX) 50 MG tablet Take 50 mg by mouth daily. 10/13/21   [provider]  vitamin C (ASCORBIC ACID) 250 MG tablet Take 250 mg by mouth daily.    [provider]    Allergies as of 12/20/2022 - Review Complete 10/18/2022  Allergen Reaction Noted   Nsaids Other (See Comments) 04/26/2018   Sulfamethoxazole Swelling 04/22/2013   Elemental sulfur Rash 04/06/2011   Penicillins Swelling and Rash 04/06/2011    Family History  Problem Relation Age of Onset   Hypertension Mother    Breast cancer Mother    Cancer Father    Heart disease Father        bypass   Diabetes Father    Congestive Heart Failure Maternal Aunt    Breast cancer Maternal Grandmother  Diabetes Paternal Grandfather    Colon cancer Neg Hx    Esophageal cancer Neg Hx    Rectal cancer Neg Hx    Stomach cancer Neg Hx    Colon polyps Neg Hx     Social History   Socioeconomic History   Marital status: Divorced    Spouse name: Not on file   Number of children: Not on file   Years of education: Not on file   Highest education level: Not on file  Occupational History   Not on file  Tobacco Use   Smoking status: Never   Smokeless tobacco: Never  Vaping Use   Vaping status: Never Used  Substance and Sexual Activity   Alcohol use: No    Drug use: No   Sexual activity: Yes    Partners: Male    Birth control/protection: Surgical, None    Comment: [facility]  Other Topics Concern   Not on file  Social History Narrative   Not on file   Social Determinants of Health   Financial Resource Strain: Not on file  Food Insecurity: No Food Insecurity (11/08/2021)   Hunger Vital Sign    Worried About Running Out of Food in the Last Year: Never true    Ran Out of Food in the Last Year: Never true  Transportation Needs: No Transportation Needs (11/08/2021)   PRAPARE - Administrator, Civil Service (Medical): No    Lack of Transportation (Non-Medical): No  Physical Activity: Not on file  Stress: Not on file  Social Connections: Not on file  Intimate Partner Violence: Not on file    Review of Systems: See HPI, otherwise negative ROS  Physical Exam: BP 129/64   Pulse 75   Temp 98.5 F (36.9 C) (Oral)   Resp 18   Ht 5\' 3"  (1.6 m)   Wt 70.3 kg   LMP 04/04/2005   SpO2 97%   BMI 27.46 kg/m  General:   Alert,  Well-developed, well-nourished, pleasant and cooperative in NAD Neck:  Supple; no masses or thyromegaly. No significant cervical adenopathy. Lungs:  Clear throughout to auscultation.   No wheezes, crackles, or rhonchi. No acute distress. Heart:  Regular rate and rhythm; no murmurs, clicks, rubs,  or gallops. Abdomen: Non-distended, normal bowel sounds.  Soft and nontender without appreciable mass or hepatosplenomegaly.   Impression/Plan: 62 year old lady here for average risk screening colonoscopy. The risks, benefits, limitations, alternatives and imponderables have been reviewed with the patient. Questions have been answered. All parties are agreeable.       Notice: This dictation was prepared with Dragon dictation along with smaller phrase technology. Any transcriptional errors that result from this process are unintentional and may not be corrected upon review.

## 2023-01-31 ENCOUNTER — Encounter (HOSPITAL_COMMUNITY): Payer: Self-pay | Admitting: Internal Medicine

## 2023-03-24 DIAGNOSIS — M9902 Segmental and somatic dysfunction of thoracic region: Secondary | ICD-10-CM | POA: Diagnosis not present

## 2023-03-24 DIAGNOSIS — M9901 Segmental and somatic dysfunction of cervical region: Secondary | ICD-10-CM | POA: Diagnosis not present

## 2023-03-24 DIAGNOSIS — M542 Cervicalgia: Secondary | ICD-10-CM | POA: Diagnosis not present

## 2023-03-24 DIAGNOSIS — M546 Pain in thoracic spine: Secondary | ICD-10-CM | POA: Diagnosis not present

## 2023-03-27 DIAGNOSIS — M542 Cervicalgia: Secondary | ICD-10-CM | POA: Diagnosis not present

## 2023-03-27 DIAGNOSIS — M9901 Segmental and somatic dysfunction of cervical region: Secondary | ICD-10-CM | POA: Diagnosis not present

## 2023-03-27 DIAGNOSIS — M9902 Segmental and somatic dysfunction of thoracic region: Secondary | ICD-10-CM | POA: Diagnosis not present

## 2023-03-27 DIAGNOSIS — M546 Pain in thoracic spine: Secondary | ICD-10-CM | POA: Diagnosis not present

## 2023-03-31 DIAGNOSIS — M542 Cervicalgia: Secondary | ICD-10-CM | POA: Diagnosis not present

## 2023-03-31 DIAGNOSIS — M9902 Segmental and somatic dysfunction of thoracic region: Secondary | ICD-10-CM | POA: Diagnosis not present

## 2023-03-31 DIAGNOSIS — M9901 Segmental and somatic dysfunction of cervical region: Secondary | ICD-10-CM | POA: Diagnosis not present

## 2023-03-31 DIAGNOSIS — M546 Pain in thoracic spine: Secondary | ICD-10-CM | POA: Diagnosis not present

## 2023-04-06 DIAGNOSIS — M542 Cervicalgia: Secondary | ICD-10-CM | POA: Diagnosis not present

## 2023-04-06 DIAGNOSIS — M546 Pain in thoracic spine: Secondary | ICD-10-CM | POA: Diagnosis not present

## 2023-04-06 DIAGNOSIS — M9901 Segmental and somatic dysfunction of cervical region: Secondary | ICD-10-CM | POA: Diagnosis not present

## 2023-04-06 DIAGNOSIS — M9902 Segmental and somatic dysfunction of thoracic region: Secondary | ICD-10-CM | POA: Diagnosis not present

## 2023-04-12 DIAGNOSIS — M542 Cervicalgia: Secondary | ICD-10-CM | POA: Diagnosis not present

## 2023-04-12 DIAGNOSIS — M9902 Segmental and somatic dysfunction of thoracic region: Secondary | ICD-10-CM | POA: Diagnosis not present

## 2023-04-12 DIAGNOSIS — M9901 Segmental and somatic dysfunction of cervical region: Secondary | ICD-10-CM | POA: Diagnosis not present

## 2023-04-12 DIAGNOSIS — M546 Pain in thoracic spine: Secondary | ICD-10-CM | POA: Diagnosis not present

## 2023-04-13 DIAGNOSIS — Z6830 Body mass index (BMI) 30.0-30.9, adult: Secondary | ICD-10-CM | POA: Diagnosis not present

## 2023-04-13 DIAGNOSIS — E6609 Other obesity due to excess calories: Secondary | ICD-10-CM | POA: Diagnosis not present

## 2023-04-13 DIAGNOSIS — E039 Hypothyroidism, unspecified: Secondary | ICD-10-CM | POA: Diagnosis not present

## 2023-04-13 DIAGNOSIS — J45909 Unspecified asthma, uncomplicated: Secondary | ICD-10-CM | POA: Diagnosis not present

## 2023-04-19 ENCOUNTER — Other Ambulatory Visit (HOSPITAL_COMMUNITY): Payer: Self-pay | Admitting: Internal Medicine

## 2023-04-19 ENCOUNTER — Ambulatory Visit (HOSPITAL_COMMUNITY)
Admission: RE | Admit: 2023-04-19 | Discharge: 2023-04-19 | Disposition: A | Discharge status: Home / Self Care | Payer: Federal, State, Local not specified - PPO | Source: Ambulatory Visit | Attending: Internal Medicine | Admitting: Internal Medicine

## 2023-04-19 DIAGNOSIS — R0989 Other specified symptoms and signs involving the circulatory and respiratory systems: Secondary | ICD-10-CM | POA: Diagnosis not present

## 2023-04-19 DIAGNOSIS — R052 Subacute cough: Secondary | ICD-10-CM | POA: Diagnosis not present

## 2023-04-19 DIAGNOSIS — R059 Cough, unspecified: Secondary | ICD-10-CM | POA: Diagnosis not present

## 2023-04-19 DIAGNOSIS — K449 Diaphragmatic hernia without obstruction or gangrene: Secondary | ICD-10-CM | POA: Diagnosis not present

## 2023-05-02 ENCOUNTER — Other Ambulatory Visit (HOSPITAL_BASED_OUTPATIENT_CLINIC_OR_DEPARTMENT_OTHER): Payer: Self-pay | Admitting: Obstetrics & Gynecology

## 2023-05-02 DIAGNOSIS — Z9189 Other specified personal risk factors, not elsewhere classified: Secondary | ICD-10-CM

## 2023-06-03 ENCOUNTER — Ambulatory Visit
Admission: RE | Admit: 2023-06-03 | Discharge: 2023-06-03 | Disposition: A | Discharge status: Home / Self Care | Payer: Federal, State, Local not specified - PPO | Source: Ambulatory Visit | Attending: Obstetrics & Gynecology | Admitting: Obstetrics & Gynecology

## 2023-06-03 DIAGNOSIS — Z1239 Encounter for other screening for malignant neoplasm of breast: Secondary | ICD-10-CM | POA: Diagnosis not present

## 2023-06-03 DIAGNOSIS — Z9189 Other specified personal risk factors, not elsewhere classified: Secondary | ICD-10-CM

## 2023-06-03 DIAGNOSIS — N6001 Solitary cyst of right breast: Secondary | ICD-10-CM | POA: Diagnosis not present

## 2023-06-03 MED ORDER — GADOPICLENOL 0.5 MMOL/ML IV SOLN
10.0000 mL | Freq: Once | INTRAVENOUS | Status: AC | PRN
  Administered 2023-06-03: 8 mL via INTRAVENOUS

## 2023-08-11 DIAGNOSIS — R7309 Other abnormal glucose: Secondary | ICD-10-CM | POA: Diagnosis not present

## 2023-08-11 DIAGNOSIS — R5383 Other fatigue: Secondary | ICD-10-CM | POA: Diagnosis not present

## 2023-08-11 DIAGNOSIS — E039 Hypothyroidism, unspecified: Secondary | ICD-10-CM | POA: Diagnosis not present

## 2023-08-11 DIAGNOSIS — Z6829 Body mass index (BMI) 29.0-29.9, adult: Secondary | ICD-10-CM | POA: Diagnosis not present

## 2023-08-11 DIAGNOSIS — E663 Overweight: Secondary | ICD-10-CM | POA: Diagnosis not present

## 2023-08-20 ENCOUNTER — Ambulatory Visit
Admission: RE | Admit: 2023-08-20 | Discharge: 2023-08-20 | Disposition: A | Discharge status: Home / Self Care | Payer: Self-pay | Source: Ambulatory Visit | Attending: Family Medicine | Admitting: Family Medicine

## 2023-08-20 VITALS — BP 135/73 | HR 96 | Temp 99.0°F | Resp 18

## 2023-08-20 DIAGNOSIS — U071 COVID-19: Secondary | ICD-10-CM

## 2023-08-20 LAB — POC SARS CORONAVIRUS 2 AG -  ED: SARS Coronavirus 2 Ag: POSITIVE — AB

## 2023-08-20 MED ORDER — PAXLOVID (300/100) 20 X 150 MG & 10 X 100MG PO TBPK: 3.0000 | ORAL_TABLET | Freq: Two times a day (BID) | ORAL | 0 refills | Status: AC

## 2023-08-20 NOTE — ED Provider Notes (Signed)
 [facility]    CSN: [id number] Arrival date & time: 08/20/23  1244      History   Chief Complaint Chief Complaint  Patient presents with   Fever    Chest congestion - Entered by patient    HPI Melissa Pitts is a 63 y.o. female.   Patient presenting today with 2-day history of fever, sore throat, cough, congestion, fatigue.  Denies chest pain, shortness of breath, abdominal pain, vomiting, diarrhea.  Taking Tylenol  and Mucinex D with minimal relief.  No known sick contacts recently.  No known history of chronic pulmonary disease.    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   Migraines 04/09/2021   Other allergic rhinitis 02/17/2021   Frequent episodes of sinusitis 02/17/2021   Osteopenia 08/17/2020   Increased risk of breast cancer 08/17/2020   Family history of breast cancer 08/17/2020   Hypothyroidism (acquired) 04/18/2018   Hypomagnesemia 01/01/2013   SIRS (systemic inflammatory response syndrome) (HCC) 12/24/2012   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   Bariatric surgery status 11/21/2012   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   Clinical depression 06/25/2012    Past Surgical History:  Procedure Laterality Date   BIOPSY  04/07/2022   Procedure: BIOPSY;  Surgeon: Suzette Espy, MD;  Location: [facility];  Service: Endoscopy;;   bypass leak repair  11/21/12   COLONOSCOPY WITH PROPOFOL  N/A 01/23/2023   Procedure: COLONOSCOPY WITH PROPOFOL ;  Surgeon: Suzette Espy, MD;  Location:  [facility];  Service: Endoscopy;  Laterality: N/A;  815am, asa 2   ESOPHAGOGASTRODUODENOSCOPY (EGD) WITH PROPOFOL  N/A 04/07/2022   Procedure: ESOPHAGOGASTRODUODENOSCOPY (EGD) WITH PROPOFOL ;  Surgeon: Suzette Espy, MD;  Location: [facility];  Service: Endoscopy;  Laterality: N/A;  9:15 am   EXTRACORPOREAL SHOCK WAVE LITHOTRIPSY Right 04/26/2018   Procedure: EXTRACORPOREAL SHOCK WAVE LITHOTRIPSY (ESWL);  Surgeon: Marco Severs, MD;  Location: [facility];  Service: Urology;  Laterality: Right;   EXTRACORPOREAL SHOCK WAVE LITHOTRIPSY Right 06/11/2018   Procedure: EXTRACORPOREAL SHOCK WAVE LITHOTRIPSY (ESWL);  Surgeon: Marco Severs, MD;  Location: [facility];  Service: Urology;  Laterality: Right;   GASTRIC BYPASS  11/14/12   HERNIA REPAIR  5/15   LAPAROSCOPIC APPENDECTOMY N/A 04/09/2020   Procedure: APPENDECTOMY LAPAROSCOPIC;  Surgeon: Awilda Bogus, MD;  Location: [facility];  Service: General;  Laterality: N/A;   MALONEY DILATION N/A 04/07/2022   Procedure: Londa Rival DILATION;  Surgeon: Suzette Espy, MD;  Location: [facility];  Service: Endoscopy;  Laterality: N/A;   VAGINAL HYSTERECTOMY  8/07    OB History     Gravida  2   Para  1   Term      Preterm      AB  1   Living  1      SAB  1   IAB      Ectopic      Multiple  Live Births               Home Medications    Prior to Admission medications   Medication Sig Start Date End Date Taking? Authorizing Provider  nirmatrelvir/ritonavir (PAXLOVID, 300/100,) 20 x 150 MG & 10 x 100MG  TBPK Take 3 tablets by mouth 2 (two) times daily for 5 days. Patient GFR is >60. Take nirmatrelvir (150 mg) two tablets twice daily for 5 days and ritonavir (100 mg) one tablet twice daily for 5 days. 08/20/23 08/25/23 Yes Corbin Dess, PA-C  Cholecalciferol  (VITAMIN D ) 50 MCG (2000 UT) tablet Take 2,000 Units by mouth daily.    [provider]  DULoxetine (CYMBALTA) 60 MG capsule Take 60 mg by mouth daily.  04/20/22   [provider]  levothyroxine (SYNTHROID, LEVOTHROID) 50 MCG tablet Take 50 mcg by mouth every evening. 03/07/11   [provider]  phentermine 15 MG capsule Take 15 mg by mouth daily. 03/17/22   [provider]  promethazine  (PHENERGAN ) 25 MG tablet Take 1 tablet (25 mg total) by mouth every 6 (six) hours as needed for nausea or vomiting. 08/22/22   Lillian Rein, MD  topiramate (TOPAMAX) 50 MG tablet Take 50 mg by mouth daily. 10/13/21   [provider]  vitamin C (ASCORBIC ACID) 250 MG tablet Take 250 mg by mouth daily.    [provider]    Family History Family History  Problem Relation Age of Onset   Hypertension Mother    Breast cancer Mother    Cancer Father    Heart disease Father        bypass   Diabetes Father    Congestive Heart Failure Maternal Aunt    Breast cancer Maternal Grandmother            Diabetes Paternal Grandfather    Colon cancer Neg Hx    Esophageal cancer Neg Hx    Rectal cancer Neg Hx    Stomach cancer Neg Hx    Colon polyps Neg Hx     Social History Social History   Tobacco Use   Smoking status: Never   Smokeless tobacco: Never  Vaping Use   Vaping status: Never Used  Substance Use Topics   Alcohol use: No   Drug use: No     Allergies   Nsaids, Sulfamethoxazole, Elemental sulfur, and Penicillins   Review of Systems Review of Systems PER HPI  Physical Exam Triage Vital Signs ED Triage Vitals  Encounter Vitals Group     BP 08/20/23 1305 135/73     Systolic BP Percentile --      Diastolic BP Percentile --      Pulse Rate 08/20/23 1305 96     Resp 08/20/23 1305 18     Temp 08/20/23 1305 99 F (37.2 C)     Temp Source 08/20/23 1305 Oral     SpO2 08/20/23 1305 95 %     Weight --      Height --      Head Circumference --      Peak Flow --      Pain Score 08/20/23 1304 1     Pain Loc --      Pain Education --      Exclude from Growth Chart --    No data found.  Updated  Vital Signs BP 135/73 (BP Location: Right Arm)   Pulse 96   Temp 99 F (37.2 C) (Oral)   Resp 18  LMP 04/04/2005   SpO2 95%   Visual Acuity Right Eye Distance:   Left Eye Distance:   Bilateral Distance:    Right Eye Near:   Left Eye Near:    Bilateral Near:     Physical Exam Vitals and nursing note reviewed.  Constitutional:      Appearance: Normal appearance.  HENT:     Head: Atraumatic.     Right Ear: Tympanic membrane and external ear normal.     Left Ear: Tympanic membrane and external ear normal.     Nose: Rhinorrhea present.     Mouth/Throat:     Mouth: Mucous membranes are moist.     Pharynx: Posterior oropharyngeal erythema present.  Eyes:     Extraocular Movements: Extraocular movements intact.     Conjunctiva/sclera: Conjunctivae normal.  Cardiovascular:     Rate and Rhythm: Normal rate and regular rhythm.     Heart sounds: Normal heart sounds.  Pulmonary:     Effort: Pulmonary effort is normal.     Breath sounds: Normal breath sounds. No wheezing.  Musculoskeletal:        General: Normal range of motion.     Cervical back: Normal range of motion and neck supple.  Skin:    General: Skin is warm and dry.  Neurological:     Mental Status: She is alert and oriented to person, place, and time.  Psychiatric:        Mood and Affect: Mood normal.        Thought Content: Thought content normal.      UC Treatments / Results  Labs (all labs ordered are listed, but only abnormal results are displayed) Labs Reviewed  POC SARS CORONAVIRUS 2 AG -  ED - Abnormal; Notable for the following components:      Result Value   SARS Coronavirus 2 Ag Positive (*)    All other components within normal limits    EKG   Radiology No results found.  Procedures Procedures (including critical care time)  Medications Ordered in UC Medications - No data to display  Initial Impression / Assessment and Plan / UC Course  I have reviewed the triage vital signs and  the nursing notes.  Pertinent labs & imaging results that were available during my care of the patient were reviewed by me and considered in my medical decision making (see chart for details).     Vitals and exam reassuring today, rapid COVID-positive.  Treat with Paxlovid, supportive over-the-counter medications and home care.  Return for worsening symptoms. Final Clinical Impressions(s) / UC Diagnoses   Final diagnoses:  COVID-19   Discharge Instructions   None    ED Prescriptions     Medication Sig Dispense Auth. Provider   nirmatrelvir/ritonavir (PAXLOVID, 300/100,) 20 x 150 MG & 10 x 100MG  TBPK Take 3 tablets by mouth 2 (two) times daily for 5 days. Patient GFR is >60. Take nirmatrelvir (150 mg) two tablets twice daily for 5 days and ritonavir (100 mg) one tablet twice daily for 5 days. 30 tablet Corbin Dess, [place]      PDMP not reviewed this encounter.   Corbin Dess, [place] 08/20/23 1345

## 2023-08-20 NOTE — ED Triage Notes (Signed)
 Pt reports she has had a cough, sore throat congestion, and fever x 2 days   Took tylenol  and mucinex D but only slight relief

## 2023-08-24 ENCOUNTER — Ambulatory Visit
Admission: EM | Admit: 2023-08-24 | Discharge: 2023-08-24 | Disposition: A | Discharge status: Home / Self Care | Attending: Nurse Practitioner | Admitting: Nurse Practitioner

## 2023-08-24 ENCOUNTER — Telehealth: Payer: Self-pay

## 2023-08-24 DIAGNOSIS — U071 COVID-19: Secondary | ICD-10-CM | POA: Diagnosis not present

## 2023-08-24 MED ORDER — PROMETHAZINE-DM 6.25-15 MG/5ML PO SYRP: 5.0000 mL | ORAL_SOLUTION | Freq: Every evening | ORAL | 0 refills | Status: DC | PRN

## 2023-08-24 MED ORDER — BENZONATATE 100 MG PO CAPS: 100.0000 mg | ORAL_CAPSULE | Freq: Three times a day (TID) | ORAL | 0 refills | Status: DC | PRN

## 2023-08-24 NOTE — ED Triage Notes (Signed)
 Pt reports COVID positive test on 08/20/2023, states cough has since gotten worse, is now unable to rest at night.

## 2023-08-24 NOTE — Discharge Instructions (Addendum)
 Your COVID-19 symptoms should improve in the next few days.    Some things that can make you feel better are: - Increased rest - Increasing fluid with water /sugar free electrolytes - Acetaminophen  as needed for fever/pain - Salt water  gargling, chloraseptic spray and throat lozenges - OTC guaifenesin (Mucinex) 600 mg twice daily - Saline sinus flushes or a neti pot - Humidifying the air -Tessalon Perles every 8 hours as needed for dry cough and cough suppressant medication at night time as needed

## 2023-08-24 NOTE — Telephone Encounter (Signed)
 Pt states she was diagnosed with COVID on 08/20/2023, says cough has gotten worse and would like cough medicine I informed pt that she would need to come in to be re-evaluated, since her cough has gotten worse. Pt verbalized understanding.

## 2023-08-24 NOTE — ED Provider Notes (Signed)
 [facility]    CSN: [id number] Arrival date & time: 08/24/23  1455      History   Chief Complaint No chief complaint on file.   HPI Melissa Pitts is a 63 y.o. female.   Patient presents today with 6 days of cough.  She was seen on 08/20/2023 for similar symptoms, tested positive for COVID-19, and was treated with Paxlovid .  She reports the fever, sore throat, and most of the other symptoms have improved, however she now has a congested cough that is keeping her awake at night time.  No chest pain or shortness of breath, runny or stuffy nose, headache, ear pain, abdominal pain, nausea/vomiting, or diarrhea.  She does have a metallic taste in her mouth from the Paxlovid  and has not been eating as much as normal, but is forcing herself to eat and drink.  She is wondering what she can take safely for cough.    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   Migraines 04/09/2021   Other allergic rhinitis 02/17/2021   Frequent episodes of sinusitis 02/17/2021   Osteopenia 08/17/2020   Increased risk of breast cancer 08/17/2020   Family history of breast cancer 08/17/2020   Hypothyroidism (acquired) 04/18/2018   Hypomagnesemia 01/01/2013   SIRS (systemic inflammatory response syndrome) (HCC) 12/24/2012   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   Bariatric surgery status 11/21/2012   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   Clinical depression 06/25/2012    Past Surgical History:  Procedure Laterality Date    BIOPSY  04/07/2022   Procedure: BIOPSY;  Surgeon: Suzette Espy, MD;  [facility]: [facility];  Service: Endoscopy;;   bypass leak repair  11/21/12   COLONOSCOPY WITH PROPOFOL  N/A 01/23/2023   Procedure: COLONOSCOPY WITH PROPOFOL ;  Surgeon: Suzette Espy, MD;  [facility]: [facility];  Service: Endoscopy;  Laterality: N/A;  815am, asa 2   ESOPHAGOGASTRODUODENOSCOPY (EGD) WITH PROPOFOL  N/A 04/07/2022   Procedure: ESOPHAGOGASTRODUODENOSCOPY (EGD) WITH PROPOFOL ;  Surgeon: Suzette Espy, MD;  [facility];  Service: Endoscopy;  Laterality: N/A;  9:15 am   EXTRACORPOREAL SHOCK WAVE LITHOTRIPSY Right 04/26/2018   Procedure: EXTRACORPOREAL SHOCK WAVE LITHOTRIPSY (ESWL);  Surgeon: Marco Severs, MD;  Location: [facility];  Service: Urology;  Laterality: Right;   EXTRACORPOREAL SHOCK WAVE LITHOTRIPSY Right 06/11/2018   Procedure: EXTRACORPOREAL SHOCK WAVE LITHOTRIPSY (ESWL);  Surgeon: Marco Severs, MD;  Location: [facility];  Service: Urology;  Laterality: Right;   GASTRIC BYPASS  11/14/12   HERNIA REPAIR  5/15   LAPAROSCOPIC APPENDECTOMY N/A 04/09/2020   Procedure: APPENDECTOMY LAPAROSCOPIC;  Surgeon: Awilda Bogus, MD;  Location: [facility];  Service: General;  Laterality: N/A;   MALONEY DILATION N/A 04/07/2022   Procedure: Londa Rival DILATION;  Surgeon: Suzette Espy, MD;  [facility]: [facility];  Service: Endoscopy;  Laterality: N/A;   VAGINAL HYSTERECTOMY  8/07    OB History  Gravida  2   Para  1   Term      Preterm      AB  1   Living  1      SAB  1   IAB      Ectopic      Multiple      Live Births               Home Medications    Prior to Admission medications   Medication Sig Start Date End Date Taking? Authorizing Provider  benzonatate (TESSALON) 100 MG capsule Take 1 capsule (100 mg total) by mouth 3 (three) times daily as needed for cough. Do not take with alcohol or while operating or driving heavy machinery 5/40/98  Yes Wilhemena Harbour,  NP  promethazine -dextromethorphan (PROMETHAZINE -DM) 6.25-15 MG/5ML syrup Take 5 mLs by mouth at bedtime as needed for cough. Do not take with alcohol or while driving or operating heavy machinery.  May cause drowsiness. 08/24/23  Yes Wilhemena Harbour, NP  Cholecalciferol  (VITAMIN D ) 50 MCG (2000 UT) tablet Take 2,000 Units by mouth daily.    [provider]  DULoxetine (CYMBALTA) 60 MG capsule Take 60 mg by mouth daily. 04/20/22   [provider]  levothyroxine (SYNTHROID, LEVOTHROID) 50 MCG tablet Take 50 mcg by mouth every evening. 03/07/11   [provider]  nirmatrelvir/ritonavir (PAXLOVID , 300/100,) 20 x 150 MG & 10 x 100MG  TBPK Take 3 tablets by mouth 2 (two) times daily for 5 days. Patient GFR is >60. Take nirmatrelvir (150 mg) two tablets twice daily for 5 days and ritonavir (100 mg) one tablet twice daily for 5 days. 08/20/23 08/25/23  Corbin Dess, PA-C  phentermine 15 MG capsule Take 15 mg by mouth daily. 03/17/22   [provider]  promethazine  (PHENERGAN ) 25 MG tablet Take 1 tablet (25 mg total) by mouth every 6 (six) hours as needed for nausea or vomiting. 08/22/22   Lillian Rein, MD  topiramate (TOPAMAX) 50 MG tablet Take 50 mg by mouth daily. 10/13/21   [provider]  vitamin C (ASCORBIC ACID) 250 MG tablet Take 250 mg by mouth daily.    [provider]    Family History Family History  Problem Relation Age of Onset   Hypertension Mother    Breast cancer Mother    Cancer Father    Heart disease Father        bypass   Diabetes Father    Congestive Heart Failure Maternal Aunt    Breast cancer Maternal Grandmother            Diabetes Paternal Grandfather    Colon cancer Neg Hx    Esophageal cancer Neg Hx    Rectal cancer Neg Hx    Stomach cancer Neg Hx    Colon polyps Neg Hx     Social History Social History   Tobacco Use   Smoking status: Never   Smokeless tobacco: Never  Vaping Use   Vaping status:  Never Used  Substance Use Topics   Alcohol use: No   Drug use: No     Allergies   Nsaids, Sulfamethoxazole, Elemental sulfur, and Penicillins   Review of Systems Review of Systems Per HPI  Physical Exam Triage Vital Signs ED Triage Vitals  Encounter Vitals Group     BP 08/24/23 1502 134/81     Systolic BP Percentile --      Diastolic BP Percentile --  Pulse Rate 08/24/23 1502 80     Resp 08/24/23 1502 18     Temp 08/24/23 1502 98.3 F (36.8 C)     Temp Source 08/24/23 1502 Oral     SpO2 08/24/23 1502 97 %     Weight --      Height --      Head Circumference --      Peak Flow --      Pain Score 08/24/23 1506 0     Pain Loc --      Pain Education --      Exclude from Growth Chart --    No data found.  Updated Vital Signs BP 134/81 (BP Location: Right Arm)   Pulse 80   Temp 98.3 F (36.8 C) (Oral)   Resp 18   LMP 04/04/2005   SpO2 97%   Visual Acuity Right Eye Distance:   Left Eye Distance:   Bilateral Distance:    Right Eye Near:   Left Eye Near:    Bilateral Near:     Physical Exam Vitals and nursing note reviewed.  Constitutional:      General: She is not in acute distress.    Appearance: Normal appearance. She is not ill-appearing or toxic-appearing.  HENT:     Head: Normocephalic and atraumatic.     Right Ear: Tympanic membrane, ear canal and external ear normal.     Left Ear: Tympanic membrane, ear canal and external ear normal.     Nose: No congestion or rhinorrhea.     Mouth/Throat:     Mouth: Mucous membranes are moist.     Pharynx: Oropharynx is clear. No oropharyngeal exudate or posterior oropharyngeal erythema.  Eyes:     General: No scleral icterus.    Extraocular Movements: Extraocular movements intact.  Cardiovascular:     Rate and Rhythm: Normal rate and regular rhythm.  Pulmonary:     Effort: Pulmonary effort is normal. No respiratory distress.     Breath sounds: Normal breath sounds. No wheezing, rhonchi or rales.   Musculoskeletal:     Cervical back: Normal range of motion and neck supple.  Lymphadenopathy:     Cervical: No cervical adenopathy.  Skin:    General: Skin is warm and dry.     Coloration: Skin is not jaundiced or pale.     Findings: No erythema or rash.  Neurological:     Mental Status: She is alert and oriented to person, place, and time.  Psychiatric:        Behavior: Behavior is cooperative.      UC Treatments / Results  Labs (all labs ordered are listed, but only abnormal results are displayed) Labs Reviewed - No data to display  EKG   Radiology No results found.  Procedures Procedures (including critical care time)  Medications Ordered in UC Medications - No data to display  Initial Impression / Assessment and Plan / UC Course  I have reviewed the triage vital signs and the nursing notes.  Pertinent labs & imaging results that were available during my care of the patient were reviewed by me and considered in my medical decision making (see chart for details).   Patient is well-appearing, normotensive, afebrile, not tachycardic, not tachypneic, oxygenating well on room air.   1. COVID-19 Patient is already being treated with Paxlovid , can continue to completion In addition, recommended starting guaifenesin, Tessalon, and Promethazine  DM to help with cough Return and ER precautions discussed  The patient was given  the opportunity to ask questions.  All questions answered to their satisfaction.  The patient is in agreement to this plan.   Final Clinical Impressions(s) / UC Diagnoses   Final diagnoses:  COVID-19     Discharge Instructions      Your COVID-19 symptoms should improve in the next few days.    Some things that can make you feel better are: - Increased rest - Increasing fluid with water /sugar free electrolytes - Acetaminophen  as needed for fever/pain - Salt water  gargling, chloraseptic spray and throat lozenges - OTC guaifenesin (Mucinex)  600 mg twice daily - Saline sinus flushes or a neti pot - Humidifying the air -Tessalon Perles every 8 hours as needed for dry cough and cough suppressant medication at night time as needed   ED Prescriptions     Medication Sig Dispense Auth. Provider   benzonatate (TESSALON) 100 MG capsule Take 1 capsule (100 mg total) by mouth 3 (three) times daily as needed for cough. Do not take with alcohol or while operating or driving heavy machinery 21 capsule Thena Fireman A, NP   promethazine -dextromethorphan (PROMETHAZINE -DM) 6.25-15 MG/5ML syrup Take 5 mLs by mouth at bedtime as needed for cough. Do not take with alcohol or while driving or operating heavy machinery.  May cause drowsiness. 118 mL Wilhemena Harbour, NP      PDMP not reviewed this encounter.   Wilhemena Harbour, NP 08/24/23 [phone]

## 2023-09-01 DIAGNOSIS — J069 Acute upper respiratory infection, unspecified: Secondary | ICD-10-CM | POA: Diagnosis not present

## 2023-09-01 DIAGNOSIS — E663 Overweight: Secondary | ICD-10-CM | POA: Diagnosis not present

## 2023-09-01 DIAGNOSIS — R5383 Other fatigue: Secondary | ICD-10-CM | POA: Diagnosis not present

## 2023-09-01 DIAGNOSIS — Z9884 Bariatric surgery status: Secondary | ICD-10-CM | POA: Diagnosis not present

## 2023-09-01 DIAGNOSIS — E538 Deficiency of other specified B group vitamins: Secondary | ICD-10-CM | POA: Diagnosis not present

## 2023-09-01 DIAGNOSIS — Z6829 Body mass index (BMI) 29.0-29.9, adult: Secondary | ICD-10-CM | POA: Diagnosis not present

## 2023-09-04 ENCOUNTER — Ambulatory Visit (HOSPITAL_BASED_OUTPATIENT_CLINIC_OR_DEPARTMENT_OTHER): Payer: Federal, State, Local not specified - PPO | Admitting: Obstetrics & Gynecology

## 2023-09-04 ENCOUNTER — Encounter (HOSPITAL_BASED_OUTPATIENT_CLINIC_OR_DEPARTMENT_OTHER): Payer: Self-pay | Admitting: Obstetrics & Gynecology

## 2023-09-04 VITALS — BP 139/67 | HR 80 | Ht 63.0 in | Wt 170.4 lb

## 2023-09-04 DIAGNOSIS — Z90710 Acquired absence of both cervix and uterus: Secondary | ICD-10-CM

## 2023-09-04 DIAGNOSIS — M85852 Other specified disorders of bone density and structure, left thigh: Secondary | ICD-10-CM

## 2023-09-04 DIAGNOSIS — Z9189 Other specified personal risk factors, not elsewhere classified: Secondary | ICD-10-CM

## 2023-09-04 DIAGNOSIS — M85851 Other specified disorders of bone density and structure, right thigh: Secondary | ICD-10-CM | POA: Diagnosis not present

## 2023-09-04 DIAGNOSIS — Z01419 Encounter for gynecological examination (general) (routine) without abnormal findings: Secondary | ICD-10-CM

## 2023-09-04 NOTE — Progress Notes (Signed)
   ANNUAL EXAM Patient name: Melissa Pitts MRN [id number]  Date of birth: [birth year] Chief Complaint:   AEX  History of Present Illness:   Melissa Pitts is a 63 y.o. G28P0011 Caucasian female being seen today for a routine annual exam.  Denies vaginal bleeding.  Has recently had covid.  Doing better.    Patient's last menstrual period was 04/04/2005.   Last pap  not incated Last mammogram: 12/21/2022 . Results were: normal. Family h/o breast cancer:  Last colonoscopy: 01/23/2023. Results were: normal. Follow up 10 years.  Family h/o colorectal cancer: no     09/04/2023    8:41 AM 08/22/2022    8:36 AM 08/20/2021    9:27 AM  Depression screen PHQ 2/9  Decreased Interest 0 0 0  Down, Depressed, Hopeless 0 0   PHQ - 2 Score 0 0 0    Review of Systems:   Pertinent items are noted in HPI Denies any urinary or bowel changes.   Pertinent History Reviewed:  Reviewed past medical,surgical, social and family history.  Reviewed problem list, medications and allergies. Physical Assessment:   Vitals:   09/04/23 0838  BP: 139/67  Pulse: 80  Weight: 170 lb 6.4 oz (77.3 kg)  Height: 5\' 3"  (1.6 m)  Body mass index is 30.19 kg/m.        Physical Examination:   General appearance - well appearing, and in no distress  Mental status - alert, oriented to person, place, and time  Psych:  She has a normal mood and affect  Skin - warm and dry, normal color, no suspicious lesions noted  Chest - effort normal, all lung fields clear to auscultation bilaterally  Heart - normal rate and regular rhythm  Neck:  midline trachea, no thyromegaly or nodules  Breasts - breasts appear normal, no suspicious masses, no skin or nipple changes or  axillary nodes  Abdomen - soft, nontender, nondistended, no masses or organomegaly  Pelvic - VULVA: normal appearing vulva with no masses, tenderness or lesions   VAGINA: normal appearing vagina with normal color and discharge, no lesions   CERVIX: surgically  absent  Thin prep pap is not indicated  UTERUS: uterus is felt to be normal size, shape, consistency and nontender   ADNEXA: No adnexal masses or tenderness noted.  Rectal - normal rectal, good sphincter tone, no masses felt.   Extremities:  No swelling or varicosities noted  Chaperone present for exam  Assessment & Plan:  1. Well woman exam with routine gynecological exam (Primary) - Pap smear not indicated - Mammogram 12/2022 - Colonoscopy 01/2023.  Follow up 10 years.   - Bone mineral density 2021.   - lab work done with PCP, Dr. Glady Laming - vaccines reviewed/updated  2. H/O vaginal hysterectomy  3. Osteopenia of necks of both femurs - DG BONE DENSITY (DXA); Future  4. Increased risk of breast cancer - will to discussed repeat MRI early next year   Meds: No orders of the defined types were placed in this encounter.   Follow-up: Return in about 1 year (around 09/03/2024).  Lillian Rein, MD 09/04/2023 9:29 AM

## 2023-09-06 DIAGNOSIS — E538 Deficiency of other specified B group vitamins: Secondary | ICD-10-CM | POA: Diagnosis not present

## 2023-09-11 DIAGNOSIS — Z6830 Body mass index (BMI) 30.0-30.9, adult: Secondary | ICD-10-CM | POA: Diagnosis not present

## 2023-09-11 DIAGNOSIS — E6609 Other obesity due to excess calories: Secondary | ICD-10-CM | POA: Diagnosis not present

## 2023-09-11 DIAGNOSIS — E538 Deficiency of other specified B group vitamins: Secondary | ICD-10-CM | POA: Diagnosis not present

## 2023-09-11 DIAGNOSIS — E039 Hypothyroidism, unspecified: Secondary | ICD-10-CM | POA: Diagnosis not present

## 2023-09-11 DIAGNOSIS — Z9884 Bariatric surgery status: Secondary | ICD-10-CM | POA: Diagnosis not present

## 2023-09-11 DIAGNOSIS — R7301 Impaired fasting glucose: Secondary | ICD-10-CM | POA: Diagnosis not present

## 2023-10-05 DIAGNOSIS — E538 Deficiency of other specified B group vitamins: Secondary | ICD-10-CM | POA: Diagnosis not present

## 2023-10-17 ENCOUNTER — Encounter (INDEPENDENT_AMBULATORY_CARE_PROVIDER_SITE_OTHER): Payer: Self-pay

## 2023-10-24 ENCOUNTER — Ambulatory Visit (INDEPENDENT_AMBULATORY_CARE_PROVIDER_SITE_OTHER)

## 2023-10-24 ENCOUNTER — Encounter: Payer: Self-pay | Admitting: Podiatry

## 2023-10-24 ENCOUNTER — Ambulatory Visit

## 2023-10-24 ENCOUNTER — Ambulatory Visit: Admitting: Podiatry

## 2023-10-24 DIAGNOSIS — M7751 Other enthesopathy of right foot: Secondary | ICD-10-CM

## 2023-10-24 DIAGNOSIS — M7752 Other enthesopathy of left foot: Secondary | ICD-10-CM

## 2023-10-24 DIAGNOSIS — M7672 Peroneal tendinitis, left leg: Secondary | ICD-10-CM

## 2023-10-24 DIAGNOSIS — M792 Neuralgia and neuritis, unspecified: Secondary | ICD-10-CM | POA: Diagnosis not present

## 2023-10-24 DIAGNOSIS — Z9884 Bariatric surgery status: Secondary | ICD-10-CM | POA: Diagnosis not present

## 2023-10-24 DIAGNOSIS — Q666 Other congenital valgus deformities of feet: Secondary | ICD-10-CM | POA: Diagnosis not present

## 2023-10-24 DIAGNOSIS — M21619 Bunion of unspecified foot: Secondary | ICD-10-CM | POA: Diagnosis not present

## 2023-10-24 DIAGNOSIS — E6609 Other obesity due to excess calories: Secondary | ICD-10-CM | POA: Diagnosis not present

## 2023-10-24 NOTE — Patient Instructions (Signed)

## 2023-10-24 NOTE — Progress Notes (Signed)
 Subjective:  Patient ID: Melissa Pitts, female    DOB: 1960/09/26,  MRN: [id number]  Chief Complaint  Patient presents with   Numbness    Rm12 / Right foot numbness and burning dorsal and plantar for 1 month/no treatment/ starts when walking a lot/not diabetic/left foot lateral foot pain that started 1 month ago.    Discussed the use of AI scribe software for clinical note transcription with the patient, who gave verbal consent to proceed.  History of Present Illness Melissa Pitts is a 63 year old female who presents with bilateral foot pain and a burning sensation in the right foot.  She experiences a burning sensation in the right foot, described as feeling like a match is on it, lasting about five minutes before subsiding. It affects the entire forefoot, including all toes, without radiating up the leg. It occurs infrequently, about once every week or every couple of weeks, without association to time of day, shoes, or activities. There is no associated swelling or back pain. She is uncertain about numbness, primarily describing a burning sensation.  No back pain or radiating pain down the leg.  The left foot is sore, particularly on the lateral aspect near the bone. There have been no injuries to either foot, and she has not received any treatment for these symptoms so far. She can take Tylenol  but has not used any topical treatments.  Her family history includes her mother having similar foot issues, including flat feet and bunions, which required surgical intervention.      Objective:    Physical Exam General: AAO x3, NAD  Dermatological: Skin is warm, dry and supple bilateral.  There are no open sores, no preulcerative lesions, no rash or signs of infection present.  Vascular: Dorsalis Pedis artery and Posterior Tibial artery pedal pulses are 2/4 bilateral with immedate capillary fill time.  There is no pain with calf compression, swelling, warmth, erythema.   Neruologic:  Grossly intact via light touch bilateral.  Sensation intact with Semmes Weinstein monofilament.  Negative Tinel sign.  Musculoskeletal: There is decreased medial arch upon weightbearing.  On the right foot not able to appreciate any area pinpoint tenderness.  On the left side there is tenderness to palpation of the distal portion of the peroneal tendon along the insertion.  There is no area pinpoint tenderness on the left foot.  Ankle, subtalar joint range of motion intact.  There is no edema bilaterally.  MMT 5/5.  Gait: Unassisted, Nonantalgic.     No images are attached to the encounter.    Results RADIOLOGY X-rays were obtained reviewed bilaterally.  He is present.  There is decreased calcaneal inclination angle.  There is no evidence of acute fracture bilaterally.   Assessment:   1. Neuritis   2. Peroneal tendonitis, left   3. Pes planovalgus      Plan:  Patient was evaluated and treated and all questions answered.  Assessment and Plan Assessment & Plan Flat feet Flat feet causing biomechanical issues, bunions, and tendonitis, leading to pressure, inflammation, and discomfort. - Recommend shoes with good arch support and stability, such as [place], [employer], or [place]. - Prescribe Fleet Feet for proper shoe fitting. - Discussed adding inserts if needed. - Advise against walking on hard surfaces; recommend elliptical or biking.  Bunions Bunions on both feet due to genetics and flat feet, causing pressure and discomfort, especially on the right foot. - Recommend shoes with good arch support. - Discuss potential surgical intervention if conservative measures  fail.  Tendonitis of the peroneal tendon Tendonitis in the left foot due to flat feet and biomechanical strain, causing soreness. - Prescribe Voltaren gel for anti-inflammatory treatment. - Provide stretching and rehabilitation exercises. - Advise icing the affected area.  Arthritis in feet Mild arthritis in both  feet, more pronounced in the right, exacerbated by flat feet. - Recommend shoes with good stability. - Advise against shoes with excessive arch support like Birkenstocks.  Localized nerve compression in right foot Intermittent burning in the right foot due to localized nerve compression, not systemic neuropathy. - Recommend Voltaren gel for burning sensation. - Advise shoes with good arch support to reduce nerve pressure. - If symptoms continue or worsen consider nerve conduction test or advanced imaging.    Return if symptoms worsen or fail to improve.   Melissa Pitts DPM

## 2023-11-03 DIAGNOSIS — E538 Deficiency of other specified B group vitamins: Secondary | ICD-10-CM | POA: Diagnosis not present

## 2023-12-01 DIAGNOSIS — E538 Deficiency of other specified B group vitamins: Secondary | ICD-10-CM | POA: Diagnosis not present

## 2023-12-11 ENCOUNTER — Ambulatory Visit: Admitting: Physician Assistant

## 2023-12-26 ENCOUNTER — Encounter: Payer: Self-pay | Admitting: Gastroenterology

## 2023-12-26 ENCOUNTER — Ambulatory Visit (INDEPENDENT_AMBULATORY_CARE_PROVIDER_SITE_OTHER): Admitting: Gastroenterology

## 2023-12-26 VITALS — BP 131/82 | HR 80 | Temp 98.2°F | Ht 63.0 in | Wt 166.6 lb

## 2023-12-26 DIAGNOSIS — E538 Deficiency of other specified B group vitamins: Secondary | ICD-10-CM | POA: Insufficient documentation

## 2023-12-26 DIAGNOSIS — R198 Other specified symptoms and signs involving the digestive system and abdomen: Secondary | ICD-10-CM | POA: Diagnosis not present

## 2023-12-26 DIAGNOSIS — R109 Unspecified abdominal pain: Secondary | ICD-10-CM | POA: Diagnosis not present

## 2023-12-26 DIAGNOSIS — Z9884 Bariatric surgery status: Secondary | ICD-10-CM

## 2023-12-26 DIAGNOSIS — E559 Vitamin D deficiency, unspecified: Secondary | ICD-10-CM

## 2023-12-26 DIAGNOSIS — R5383 Other fatigue: Secondary | ICD-10-CM | POA: Insufficient documentation

## 2023-12-26 NOTE — Progress Notes (Signed)
 GI Office Note    Referring Provider: Marvine Rush, MD Primary Care Physician:  Marvine Rush, MD  Primary Gastroenterologist: Ozell Hollingshead, MD   Chief Complaint   Chief Complaint  Patient presents with   Gas    Lots of cramping, gas, bloating, loose stools.     History of Present Illness   Melissa Pitts is a 63 y.o. female presenting today for further evaluation of loose stools. Last seen 05/2022. 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 Small bowel obstruction 2015 secondary to adhesive band over Roux limb. In 2023 she had ugi series showing 13mm barium tablet briefly stuck at GE junction before passing into stomach after several additional sips of water . The pill then lodged at the GJ anastomosis and did not pass.    Discussed the use of AI scribe software for clinical note transcription with the patient, who gave verbal consent to proceed.   She has been experiencing bowel issues for the past three to four months, characterized by cramping, a sensation of gas, and loose stools that are not completely liquid. The stools often break into small pieces, and she sometimes feels incomplete evacuation. Bowel movements occur once or twice daily, primarily in the morning, with occasional episodes later in the day. She has a history of gastric bypass surgery in 2014. She also has issues with dairy products, which seem to have worsened recently.  She associates the onset of her current bowel symptoms with a COVID-19 infection in May, during which she experienced various symptoms, including stomach issues. No blood in stool or significant weight loss has been noted, although she has been trying to lose weight. She has been using Gas X and Pepto Chews to manage her symptoms but recently stopped to see if it would make a difference.  She experiences fatigue, which she discussed with her primary care doctor. Monthly  B12 injections were started in June after her B12 levels were found to be borderline low. Despite this, she sometimes feels fatigued before the month is over. No changes in her medication regimen and no recent antibiotic use. She takes vitamins regularly. No heartburn or indigestion and no significant changes in her upper gastrointestinal symptoms. She has been trying to track her diet and symptoms but has not been able to identify any specific triggers.    Prior Data   Labs 08/2023: Vitamin B12 261, folate 15.2, white blood cell count 6.2, hemoglobin 13.9, MCV 98, platelets 231, glucose 51, creatinine 0.7, potassium 4.3, sodium 142, albumin 4.5, total bilirubin 0.3, alk phos 126, AST 15, ALT 15, TSH 1.9.  EGD 04/2022: -esophageal nodule, bx showed squamocolumnar junction type mucosa consistent with clinical impression of inlet patch -s/p maloney dilation -prior gastric surgery -hiatal hernia -widely patent efferent limb  Colonoscopy 01/2023: -normal colon -next colonoscopy 10 years     Medications   Current Outpatient Medications  Medication Sig Dispense Refill   Cholecalciferol  (VITAMIN D ) 50 MCG (2000 UT) tablet Take 2,000 Units by mouth daily.     Cyanocobalamin  (B-12 COMPLIANCE INJECTION IJ) Inject as directed every 30 (thirty) days.     DULoxetine (CYMBALTA) 60 MG capsule Take 60 mg by mouth daily.     levothyroxine (SYNTHROID, LEVOTHROID) 50 MCG tablet Take 50 mcg by mouth every evening.     No current facility-administered medications for this visit.    Allergies   Allergies as of 12/26/2023 - Review Complete 12/26/2023  Allergen  Reaction Noted   Nsaids Other (See Comments) 04/26/2018   Sulfamethoxazole Swelling 04/22/2013   Elemental sulfur Rash 04/06/2011   Penicillins Swelling and Rash 04/06/2011       Review of Systems   General: Negative for anorexia, weight loss, fever, chills, +fatigue, weakness. ENT: Negative for hoarseness, difficulty swallowing , nasal  congestion. CV: Negative for chest pain, angina, palpitations, dyspnea on exertion, peripheral edema.  Respiratory: Negative for dyspnea at rest, dyspnea on exertion, cough, sputum, wheezing.  GI: See history of present illness. GU:  Negative for dysuria, hematuria, urinary incontinence, urinary frequency, nocturnal urination.  Endo: Negative for unusual weight change.     Physical Exam   BP 131/82 (BP Location: Right Arm, Patient Position: Sitting, Cuff Size: Normal)   Pulse 80   Temp 98.2 F (36.8 C) (Temporal)   Ht 5' 3 (1.6 m)   Wt 166 lb 9.6 oz (75.6 kg)   LMP 04/04/2005   BMI 29.51 kg/m    General: Well-nourished, well-developed in no acute distress.  Eyes: No icterus. Mouth: Oropharyngeal mucosa moist and pink   Lungs: Clear to auscultation bilaterally.  Heart: Regular rate and rhythm, no murmurs rubs or gallops.  Abdomen: Bowel sounds are normal, nontender, nondistended, no hepatosplenomegaly or masses,  no abdominal bruits or hernia , no rebound or guarding.  Rectal: not performed Extremities: No lower extremity edema. No clubbing or deformities. Neuro: Alert and oriented x 4   Skin: Warm and dry, no jaundice.   Psych: Alert and cooperative, normal mood and affect.  Labs   See above  Imaging Studies   No results found.  Assessment/Plan:    Change in bowels with loose stools and abdominal cramping: Loose stools and abdominal cramping for 3-4 months. Differential includes post-infection IBS, inflammatory bowel disease, pancreatic insufficiency, SIBO. Normal colonoscopy in 2024 but asymptomatic at that time.   - GI profile, fecal calprotectin, fecal elastase.  - TTG IgA, IgA, Sed rate, CRP,.  - Provided FODMAP diet information, advised reducing high FODMAP foods. - Consider treatment for SIBO or IBS if initial tests are normal and consider CT imaging if no improvement with initial treatment.  Fatigue, history of remote gastric bypass: Fatigue possibly related  to low B12 levels. Monthly B12 injections started in June, fatigue persists before month's end. Discussed potential iron deficiency post-gastric bypass despite normal hemoglobin. Vitamin D  not checked since bypass follow-up. - CMET, CBC, B12, Vit D, iron/tibc/ferritin - Follow up with primary care provider for B12 injection management.     Sonny RAMAN. Ezzard, MHS, PA-C [facility]

## 2023-12-26 NOTE — Patient Instructions (Signed)
 Please collect stools and labs at [facility]. We will be in touch with results as available.  Try to limit foods below to 1-2 times weekly to see if this helps your gas.  Fruits Fresh, dried, and juiced forms of apple, pear, watermelon, peach, plum, cherries, apricots, blackberries, boysenberries, figs, nectarines, and mango. Avocado. Vegetables Chicory root, artichoke, asparagus, cabbage, snow peas, Brussels sprouts, broccoli, sugar snap peas, mushrooms, celery, and cauliflower. Onions, garlic, leeks, and the white part of scallions. Grains Wheat, including kamut, durum, and semolina. Barley and bulgur. Couscous. Wheat-based cereals. Wheat noodles, bread, crackers, and pastries. Meats and other proteins Fried or fatty meat. Sausage. Cashews and pistachios. Soybeans, baked beans, black beans, chickpeas, kidney beans, fava beans, navy beans, lentils, black-eyed peas, and split peas. Dairy Milk, yogurt, ice cream, and soft cheese. Cream and sour cream. Milk-based sauces. Custard. Buttermilk. Soy milk. Seasoning and other foods Any sugar-free gum or candy. Foods that contain artificial sweeteners such as sorbitol, mannitol, isomalt, or xylitol. Foods that contain honey, high-fructose corn syrup, or agave. Bouillon, vegetable stock, beef stock, and chicken stock. Garlic and onion powder. Condiments made with onion, such as hummus, chutney, pickles, relish, salad dressing, and salsa. Tomato paste. Beverages Chicory-based drinks. Coffee substitutes. Chamomile tea. Fennel tea. Sweet or fortified wines such as port or sherry. Diet soft drinks made with isomalt, mannitol, maltitol, sorbitol, or xylitol. Apple, pear, and mango juice. Juices with high-fructose corn syrup. The items listed above may not be a complete list of foods and beverages you should avoid. Contact a dietitian for more information.

## 2023-12-28 ENCOUNTER — Ambulatory Visit: Payer: Self-pay | Admitting: Gastroenterology

## 2023-12-29 LAB — GI PROFILE, STOOL, PCR

## 2023-12-29 LAB — PANCREATIC ELASTASE, FECAL: Pancreatic Elastase, Fecal: 436 ug Elast./g (ref >200)

## 2023-12-29 LAB — CALPROTECTIN, FECAL: Calprotectin, Fecal: 102 ug/g (ref 0–120)

## 2023-12-29 MED ORDER — AZITHROMYCIN 500 MG PO TABS: 500.0000 mg | ORAL_TABLET | Freq: Every day | ORAL | 0 refills | Status: AC

## 2024-01-02 ENCOUNTER — Ambulatory Visit: Admitting: Physician Assistant

## 2024-01-05 LAB — IRON,TIBC AND FERRITIN PANEL
Ferritin: 88 ng/mL (ref 15–150)
Iron Saturation: 30 % (ref 15–55)
Iron: 98 ug/dL (ref 27–139)
Total Iron Binding Capacity: 326 ug/dL (ref 250–450)
UIBC: 228 ug/dL (ref 118–369)

## 2024-01-05 LAB — VITAMIN D 1,25 DIHYDROXY
Vitamin D 1, 25 (OH)2 Total: 94 pg/mL — AB
Vitamin D2 1, 25 (OH)2: <10 pg/mL
Vitamin D3 1, 25 (OH)2: 91 pg/mL

## 2024-01-05 LAB — CBC WITH DIFFERENTIAL/PLATELET
Basophils Absolute: 0.1 x10E3/uL (ref 0.0–0.2)
Basos: 1 %
EOS (ABSOLUTE): 0.1 x10E3/uL (ref 0.0–0.4)
Eos: 1 %
Hematocrit: 42.7 % (ref 34.0–46.6)
Hemoglobin: 14 g/dL (ref 11.1–15.9)
Immature Grans (Abs): 0 x10E3/uL (ref 0.0–0.1)
Immature Granulocytes: 0 %
Lymphocytes Absolute: 1.6 x10E3/uL (ref 0.7–3.1)
Lymphs: 24 %
MCH: 32 pg (ref 26.6–33.0)
MCHC: 32.8 g/dL (ref 31.5–35.7)
MCV: 98 fL — ABNORMAL HIGH (ref 79–97)
Monocytes Absolute: 0.7 x10E3/uL (ref 0.1–0.9)
Monocytes: 10 %
Neutrophils Absolute: 4.2 x10E3/uL (ref 1.4–7.0)
Neutrophils: 64 %
Platelets: 234 x10E3/uL (ref 150–450)
RBC: 4.37 x10E6/uL (ref 3.77–5.28)
RDW: 12.8 % (ref 11.7–15.4)
WBC: 6.6 x10E3/uL (ref 3.4–10.8)

## 2024-01-05 LAB — COMPREHENSIVE METABOLIC PANEL WITH GFR
ALT: 19 IU/L (ref 0–32)
AST: 18 IU/L (ref 0–40)
Albumin: 4.3 g/dL (ref 3.9–4.9)
Alkaline Phosphatase: 113 IU/L (ref 49–135)
BUN/Creatinine Ratio: 27 (ref 12–28)
BUN: 18 mg/dL (ref 8–27)
Bilirubin Total: 0.3 mg/dL (ref 0.0–1.2)
CO2: 24 mmol/L (ref 20–29)
Calcium: 9.6 mg/dL (ref 8.7–10.3)
Chloride: 101 mmol/L (ref 96–106)
Creatinine, Ser: 0.66 mg/dL (ref 0.57–1.00)
Globulin, Total: 2.1 g/dL (ref 1.5–4.5)
Glucose: 86 mg/dL (ref 70–99)
Potassium: 4.4 mmol/L (ref 3.5–5.2)
Sodium: 140 mmol/L (ref 134–144)
Total Protein: 6.4 g/dL (ref 6.0–8.5)
eGFR: 97 mL/min/1.73 (ref >59)

## 2024-01-05 LAB — IGA: IgA/Immunoglobulin A, Serum: 172 mg/dL (ref 87–352)

## 2024-01-05 LAB — SEDIMENTATION RATE: Sed Rate: 2 mm/h (ref 0–40)

## 2024-01-05 LAB — C-REACTIVE PROTEIN: CRP: <1 mg/L (ref 0–10)

## 2024-01-05 LAB — VITAMIN B12: Vitamin B-12: 1340 pg/mL — ABNORMAL HIGH (ref 232–1245)

## 2024-01-05 LAB — TISSUE TRANSGLUTAMINASE, IGA: Transglutaminase IgA: <2 U/mL (ref 0–3)

## 2024-01-23 ENCOUNTER — Encounter (HOSPITAL_BASED_OUTPATIENT_CLINIC_OR_DEPARTMENT_OTHER): Payer: Self-pay | Admitting: Obstetrics & Gynecology

## 2024-02-12 ENCOUNTER — Ambulatory Visit (HOSPITAL_BASED_OUTPATIENT_CLINIC_OR_DEPARTMENT_OTHER): Payer: Self-pay | Admitting: Obstetrics & Gynecology

## 2024-02-17 ENCOUNTER — Other Ambulatory Visit (HOSPITAL_BASED_OUTPATIENT_CLINIC_OR_DEPARTMENT_OTHER): Payer: Self-pay | Admitting: Obstetrics & Gynecology

## 2024-02-17 DIAGNOSIS — M810 Age-related osteoporosis without current pathological fracture: Secondary | ICD-10-CM

## 2024-02-17 DIAGNOSIS — K9089 Other intestinal malabsorption: Secondary | ICD-10-CM

## 2024-02-17 DIAGNOSIS — Z9884 Bariatric surgery status: Secondary | ICD-10-CM

## 2024-02-22 ENCOUNTER — Encounter: Payer: Self-pay | Admitting: Physician Assistant

## 2024-02-22 ENCOUNTER — Ambulatory Visit (INDEPENDENT_AMBULATORY_CARE_PROVIDER_SITE_OTHER): Admitting: Physician Assistant

## 2024-02-22 VITALS — Ht 62.25 in | Wt 168.2 lb

## 2024-02-22 DIAGNOSIS — E039 Hypothyroidism, unspecified: Secondary | ICD-10-CM

## 2024-02-22 DIAGNOSIS — M810 Age-related osteoporosis without current pathological fracture: Secondary | ICD-10-CM

## 2024-02-22 NOTE — Progress Notes (Signed)
 Office Visit Note   Patient: Melissa Pitts           Date of Birth: September 05, 1960           MRN: [id number] Visit Date: 02/22/2024              Requested by: Cleotilde Ronal RAMAN, MD [address] [place],  [place] [address] PCP: Marvine Rush, MD   Assessment & Plan: Visit Diagnoses:  1. Age-related osteoporosis without current pathological fracture     Plan: Patient is a pleasant 63 year old woman referred by Dr. Cleotilde for evaluation of osteoporosis.  She had a recent bone density scan which demonstrated a -2.5 on her right femoral neck.  She has never taken medications for osteoporosis in the past.  She was concerned because she went from having normal bone density at her previous scan to being osteoporotic.  She has never had any fractures although she thinks she has lost some height.  No history of coronary disease or cancers.  No history of kidney disease or ulcers.  She does have a history of gastric bypass surgery she does not have reflux or history of seizures.  She thinks she went through menopause about 31 and was for time.  On hormone replacement therapy she does take calcium 600 mg plus D3 1 at 1 a day admits she is sometimes not completely consistent doing this she does take vitamin D  5000 international units daily and vitamin D  is followed by her primary care it was elevated above normal but this was addressed per notes have reviewed.  She does have a history of hypothyroidism.  She did have a low TSH.  I cannot see that she has had follow-up blood work.  She is not a smoker she does not consume alcoholic beverages.  She is quite active enjoys tai chi and yoga as well as the exercise bike and using the gym machines.  She has had no major dental work.  She does say her mom takes Fosamax for osteoporosis but unsure of any type of history of fracture.  I gave her information about Evenity Tymlos and Prolia she would like to research these prior to commit to committing anything.  We  discussed in detail and I reviewed her chart combination of 45 minutes about side effects of both anabolic and medications that inhibit osteoclast.  We also will draw TSH and PTH just to make sure there is no secondary cause if there was we would refer her to an endocrinologist.  Follow-Up Instructions: No follow-ups on file.   Orders:  No orders of the defined types were placed in this encounter.  No orders of the defined types were placed in this encounter.     Procedures: No procedures performed   Clinical Data: No additional findings.   Subjective: No chief complaint on file.   HPI patient is a pleasant 62 year old woman who comes in today for evaluation of osteoporosis referred by Dr. Ronal Cleotilde  Review of Systems  All other systems reviewed and are negative.    Objective: Vital Signs: LMP 04/04/2005   Physical Exam Constitutional:      Appearance: Normal appearance.  Pulmonary:     Effort: Pulmonary effort is normal.     Breath sounds: Normal breath sounds.  Skin:    General: Skin is warm and dry.  Neurological:     General: No focal deficit present.     Mental Status: She is alert and oriented to person,  place, and time.  Psychiatric:        Mood and Affect: Mood normal.        Behavior: Behavior normal.       Specialty Comments:  No specialty comments available.  Imaging: No results found.   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   Migraines 04/09/2021   Other allergic rhinitis 02/17/2021   Frequent episodes of sinusitis 02/17/2021   Osteopenia  08/17/2020   Increased risk of breast cancer 08/17/2020   Family history of breast cancer 08/17/2020   Hypothyroidism (acquired) 04/18/2018   Hypomagnesemia 01/01/2013   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   Clinical depression 06/25/2012   Past Medical History:  Diagnosis Date   Allergy    Breast mass    Depression    Gallstone    History of diabetes mellitus    history of, had gastric bypass, no longer needs medications   History of kidney stones    Hypothyroidism    on meds   Migraines    Thyroid  disease    hypothyroid    Family History  Problem Relation Age of Onset   Hypertension Mother    Breast cancer Mother    Cancer Father    Heart disease Father        bypass   Diabetes Father    Congestive Heart Failure Maternal Aunt    Breast cancer Maternal Grandmother            Diabetes Paternal Grandfather    Colon cancer Neg Hx    Esophageal cancer Neg Hx    Rectal cancer Neg Hx    Stomach cancer Neg Hx    Colon polyps Neg Hx     Past Surgical History:  Procedure Laterality Date   BIOPSY  04/07/2022   Procedure: BIOPSY;  Surgeon: Shaaron Lamar HERO, MD;  Location: [facility];  Service: Endoscopy;;   bypass leak repair  11/21/12   COLONOSCOPY WITH PROPOFOL  N/A 01/23/2023   Procedure: COLONOSCOPY WITH PROPOFOL ;  Surgeon: Shaaron Lamar HERO, MD;  Location: [facility];  Service: Endoscopy;  Laterality: N/A;  815am, asa 2   ESOPHAGOGASTRODUODENOSCOPY (EGD) WITH PROPOFOL  N/A 04/07/2022   Procedure: ESOPHAGOGASTRODUODENOSCOPY (EGD) WITH PROPOFOL ;  Surgeon: Shaaron Lamar HERO, MD;  Location: [facility];  Service: Endoscopy;  Laterality: N/A;  9:15 am   EXTRACORPOREAL SHOCK WAVE LITHOTRIPSY Right 04/26/2018   Procedure: EXTRACORPOREAL SHOCK WAVE LITHOTRIPSY (ESWL);  Surgeon: Sherrilee Belvie CROME, MD;  Location: [facility];  Service: Urology;  Laterality: Right;   EXTRACORPOREAL SHOCK WAVE LITHOTRIPSY Right 06/11/2018   Procedure: EXTRACORPOREAL  SHOCK WAVE LITHOTRIPSY (ESWL);  Surgeon: Sherrilee Belvie CROME, MD;  Location: [facility];  Service: Urology;  Laterality: Right;   GASTRIC BYPASS  11/14/12   HERNIA REPAIR  5/15   LAPAROSCOPIC APPENDECTOMY N/A 04/09/2020   Procedure: APPENDECTOMY LAPAROSCOPIC;  Surgeon: Kallie Manuelita BROCKS, MD;  Location: [facility];  Service: General;  Laterality: N/A;   MALONEY DILATION N/A 04/07/2022   Procedure: AGAPITO DILATION;  Surgeon: Shaaron Lamar HERO, MD;  Location: [facility];  Service: Endoscopy;  Laterality: N/A;   VAGINAL HYSTERECTOMY  8/07   Social History   Occupational History   Not on file  Tobacco Use   Smoking status: Never   Smokeless tobacco: Never  Vaping Use   Vaping status: Never Used  Substance and Sexual Activity   Alcohol use: No   Drug use: No   Sexual activity: Yes    Partners: Male    Birth control/protection: Surgical, None    Comment: [facility]

## 2024-02-26 LAB — PARATHYROID HORMONE, INTACT (NO CA): PTH: 106 pg/mL — ABNORMAL HIGH (ref 16–77)

## 2024-02-26 LAB — EXTRA SPECIMEN

## 2024-02-26 LAB — TSH: TSH: 1.59 m[IU]/L (ref 0.40–4.50)

## 2024-02-27 DIAGNOSIS — M810 Age-related osteoporosis without current pathological fracture: Secondary | ICD-10-CM

## 2024-02-27 DIAGNOSIS — E039 Hypothyroidism, unspecified: Secondary | ICD-10-CM

## 2024-04-12 ENCOUNTER — Telehealth: Payer: Self-pay | Admitting: Physician Assistant

## 2024-04-12 NOTE — Telephone Encounter (Signed)
 Pt is calling in stating that she is not able to get in to the referred office (Endocrinology) until 07/11/2024 and wanted to know if she should be seen by Ronal Dragon for her osteoporosis before then.  Pt would like to have a call back.

## 2024-05-17 ENCOUNTER — Ambulatory Visit: Admitting: Gastroenterology

## 2024-07-11 ENCOUNTER — Ambulatory Visit: Admitting: "Endocrinology

## 2024-09-09 ENCOUNTER — Ambulatory Visit (HOSPITAL_BASED_OUTPATIENT_CLINIC_OR_DEPARTMENT_OTHER): Admitting: Obstetrics & Gynecology
